# Patient Record
Sex: Female | Born: 1935 | Race: White | Hispanic: No | Smoking: Never smoker
Health system: Southern US, Community
[De-identification: ages and names within clinical notes are randomized; demographics above are authoritative.]

## PROBLEM LIST (undated history)

## (undated) DIAGNOSIS — Z87442 Personal history of urinary calculi: Secondary | ICD-10-CM

## (undated) DIAGNOSIS — E119 Type 2 diabetes mellitus without complications: Secondary | ICD-10-CM

## (undated) DIAGNOSIS — I4891 Unspecified atrial fibrillation: Secondary | ICD-10-CM

## (undated) DIAGNOSIS — I219 Acute myocardial infarction, unspecified: Secondary | ICD-10-CM

## (undated) DIAGNOSIS — Z794 Long term (current) use of insulin: Secondary | ICD-10-CM

## (undated) DIAGNOSIS — Z87898 Personal history of other specified conditions: Secondary | ICD-10-CM

## (undated) DIAGNOSIS — D473 Essential (hemorrhagic) thrombocythemia: Secondary | ICD-10-CM

## (undated) DIAGNOSIS — E7800 Pure hypercholesterolemia, unspecified: Secondary | ICD-10-CM

## (undated) DIAGNOSIS — I739 Peripheral vascular disease, unspecified: Secondary | ICD-10-CM

## (undated) DIAGNOSIS — F32A Depression, unspecified: Secondary | ICD-10-CM

## (undated) DIAGNOSIS — I10 Essential (primary) hypertension: Secondary | ICD-10-CM

## (undated) DIAGNOSIS — D7581 Myelofibrosis: Secondary | ICD-10-CM

## (undated) DIAGNOSIS — J449 Chronic obstructive pulmonary disease, unspecified: Secondary | ICD-10-CM

## (undated) DIAGNOSIS — F329 Major depressive disorder, single episode, unspecified: Secondary | ICD-10-CM

## (undated) DIAGNOSIS — IMO0001 Reserved for inherently not codable concepts without codable children: Secondary | ICD-10-CM

## (undated) DIAGNOSIS — I2510 Atherosclerotic heart disease of native coronary artery without angina pectoris: Secondary | ICD-10-CM

## (undated) HISTORY — DX: Essential (hemorrhagic) thrombocythemia: D47.3

## (undated) HISTORY — DX: Reserved for inherently not codable concepts without codable children: IMO0001

## (undated) HISTORY — DX: Peripheral vascular disease, unspecified: I73.9

## (undated) HISTORY — DX: Pure hypercholesterolemia, unspecified: E78.00

## (undated) HISTORY — PX: ABDOMINAL HYSTERECTOMY: SHX81

## (undated) HISTORY — DX: Type 2 diabetes mellitus without complications: E11.9

## (undated) HISTORY — DX: Unspecified atrial fibrillation: I48.91

## (undated) HISTORY — DX: Depression, unspecified: F32.A

## (undated) HISTORY — DX: Personal history of other specified conditions: Z87.898

## (undated) HISTORY — DX: Atherosclerotic heart disease of native coronary artery without angina pectoris: I25.10

## (undated) HISTORY — PX: CATARACT EXTRACTION: SUR2

## (undated) HISTORY — PX: CORONARY ARTERY BYPASS GRAFT: SHX141

## (undated) HISTORY — DX: Long term (current) use of insulin: Z79.4

## (undated) HISTORY — DX: Essential (primary) hypertension: I10

## (undated) HISTORY — DX: Major depressive disorder, single episode, unspecified: F32.9

---

## 2001-01-16 ENCOUNTER — Encounter: Payer: Self-pay | Admitting: Cardiology

## 2009-01-02 ENCOUNTER — Encounter: Payer: Self-pay | Admitting: Cardiology

## 2009-04-20 ENCOUNTER — Encounter: Payer: Self-pay | Admitting: Cardiology

## 2009-05-21 ENCOUNTER — Encounter: Payer: Self-pay | Admitting: Cardiology

## 2009-06-10 ENCOUNTER — Encounter: Payer: Self-pay | Admitting: Cardiology

## 2009-06-19 ENCOUNTER — Encounter: Payer: Self-pay | Admitting: Cardiology

## 2009-06-19 DIAGNOSIS — I2510 Atherosclerotic heart disease of native coronary artery without angina pectoris: Secondary | ICD-10-CM | POA: Insufficient documentation

## 2009-06-19 DIAGNOSIS — E785 Hyperlipidemia, unspecified: Secondary | ICD-10-CM

## 2009-06-19 DIAGNOSIS — E119 Type 2 diabetes mellitus without complications: Secondary | ICD-10-CM

## 2009-07-08 ENCOUNTER — Encounter: Payer: Self-pay | Admitting: Cardiology

## 2009-08-05 ENCOUNTER — Encounter: Payer: Self-pay | Admitting: Cardiology

## 2009-08-06 ENCOUNTER — Encounter: Payer: Self-pay | Admitting: Cardiology

## 2009-08-20 ENCOUNTER — Encounter (INDEPENDENT_AMBULATORY_CARE_PROVIDER_SITE_OTHER): Payer: Self-pay | Admitting: *Deleted

## 2009-08-20 ENCOUNTER — Ambulatory Visit: Payer: Self-pay | Admitting: Cardiology

## 2009-08-20 DIAGNOSIS — G4700 Insomnia, unspecified: Secondary | ICD-10-CM | POA: Insufficient documentation

## 2009-09-03 ENCOUNTER — Telehealth (INDEPENDENT_AMBULATORY_CARE_PROVIDER_SITE_OTHER): Payer: Self-pay | Admitting: *Deleted

## 2009-11-03 ENCOUNTER — Ambulatory Visit: Payer: Self-pay | Admitting: Cardiology

## 2010-07-06 NOTE — Assessment & Plan Note (Signed)
Summary: NP-FATIGUE,DYSPNEA ON EXERTION   Visit Type:  Initial Consult Primary Provider:  Dr. Ernestine Conrad  CC:  CAD and Fatigue.  History of Present Illness: The patient presents for evaluation of known coronary disease. She had bypass surgery she thinks n 2002 or 2004 n [place]. She doesn't report anyfollowup studies after this.She said at that time she had fatigueand back discomfort as her symptom.She doesn't recall the details of her surgery but said she had no problems with that.    She now lives hereand has not seen a cardiologistin many years. She has increasing fatigue with activity.This has been going on for about 18 months and slowly getting worse. She does report that she doesn't sleep well at night but she does take naps during the day. She finds it difficult to complete her daily activities without being tired. She does not describe shortness of breath. She does not have chest back neck or arm discomfort.She does not report palpitations, presyncope or syncope. She has no PND or orthopnea.  Preventive Screening-Counseling & Management  Alcohol-Tobacco     Smoking Status: never  Current Medications (verified): 1)  Ultram 50 Mg Tabs (Tramadol Hcl) .... Take One By Mouth Every 4-6 Hours As Needed Back Pain 2)  Lisinopril 20 Mg Tabs (Lisinopril) .... Take 1 Tablet By Mouth Once A Day 3)  Glucotrol Xl 10 Mg Xr24h-Tab (Glipizide) .... Take 1 Tablet By Mouth Once A Day 4)  Metoprolol Succinate 100 Mg Xr24h-Tab (Metoprolol Succinate) .... Take 1 Tablet By Mouth Once A Day 5)  Plavix 75 Mg Tabs (Clopidogrel Bisulfate) .... Take 1 Tablet By Mouth Once A Day 6)  Simvastatin 40 Mg Tabs (Simvastatin) .... Take 1 Tablet By Mouth Once A Day 7)  Novolin 70/30 70-30 % Susp (Insulin Isophane & Regular) .Marland Kitchen.. 10u Subcutaneously Two Times A Day 8)  Aspir-Low 81 Mg Tbec (Aspirin) .... Take 1 Tablet By Mouth Once A Day 9)  Hydroxyurea 500 Mg Caps (Hydroxyurea) .... Take 1 Tablet By Mouth Once A  Day 10)  Hydrocodone-Acetaminophen 5-500 Mg Tabs (Hydrocodone-Acetaminophen) .... As Needed Pain  Allergies: No Known Drug Allergies  Comments:  Nurse/Medical Assistant: The patient's medications were reviewed with the patient and were updated in the Medication List. Pt brought medication bottles to office visit.  Cyril Loosen, RN, BSN (August 20, 2009 11:38 AM)  Past History:  Past Medical History: CAD HTN x years TYPE II DM  (insulin x 1 year) Hyperlipidemia x 10 years  Family History: Father "Heart Burst" age 57 Mother sclerosis/EtOH  Social History: Tobacco Use - No.  Drug Use - no Alcohol, socially Single  Retired Psychiatric nurse No children  Review of Systems       Insomnia.  otherwise as stated in the history of present illness negative for all other systems.  Vital Signs:  Patient profile:   77 year old female Height:      69 inches Weight:      185 pounds BMI:     27.42 Pulse rate:   60 / minute BP sitting:   151 / 82  (right arm) Cuff size:   regular  Vitals Entered By: Cyril Loosen, RN, BSN (August 20, 2009 11:34 AM)  Nutrition Counseling: Patient's BMI is greater than 25 and therefore counseled on weight management options. CC: CAD, Fatigue   Physical Exam  General:  Well developed, well nourished, in no acute distress. Head:  normocephalic and atraumatic Eyes:  PERRLA/EOM intact; conjunctiva and lids normal. Mouth:  Poor dentition, gums and palate normal. Oral mucosa normal. Neck:  Neck supple, no JVD. No masses, thyromegaly or abnormal cervical nodes. Chest Wall:  ell healed surgical scar Lungs:  Clear bilaterally to auscultation and percussion. Abdomen:  Bowel sounds positive; abdomen soft and non-tender without masses, organomegaly, or hernias noted. No hepatosplenomegaly. Msk:  Back normal, normal gait. Muscle strength and tone normal. Extremities:  No clubbing or cyanosis. Neurologic:  Alert and oriented x 3. Skin:  Intact without  lesions or rashes. Cervical Nodes:  no significant adenopathy Axillary Nodes:  no significant adenopathy Inguinal Nodes:  no significant adenopathy Psych:  Normal affect.   Detailed Cardiovascular Exam  Neck    Carotids: Carotids full and equal bilaterally without bruits.      Neck Veins: Normal, no JVD.    Heart    Inspection: no deformities or lifts noted.      Palpation: normal PMI with no thrills palpable.      Auscultation: regular rate and rhythm, S1, S2 without murmurs, rubs, gallops, or clicks.    Vascular    Abdominal Aorta: no palpable masses, pulsations, or audible bruits.      Femoral Pulses: normal femoral pulses bilaterally.      Pedal Pulses: normal pedal pulses bilaterally.      Radial Pulses: normal radial pulses bilaterally.      Peripheral Circulation: no clubbing, cyanosis, or edema noted with normal capillary refill.     EKG  Procedure date:  08/20/2009  Findings:       Sinus bradycardia, rate 57, axis within normal limits, intervals within normal limits, no acute ST-T wave changes.  Impression & Recommendations:  Problem # 1:  CORONARY ATHEROSCLEROSIS NATIVE CORONARY ARTERY (ICD-414.01) The patient has known coronary disease.  She has had no followup studies since her bypass which may have been7 years ago. I will try to get these records from [place].I am concerned that her current fatigue could be an anginal equivalent. Further testing is indicated with exercise perfusion imaging. We will continue with risk reduction as well. Orders: EKG w/ Interpretation (93000) Nuclear Med (Nuc Med)  Problem # 2:  INSOMNIA (ICD-780.52) I have asked her to review this with her primary physician as this is clearly contributing to her fatigue and needs to be treated.  Problem # 3:  HYPERLIPIDEMIA (ICD-272.4) i will defer to her primary physician with a goal LDL less than 70 and HDL greater than 50. Her updated medication list for this problem includes:     Simvastatin 40 Mg Tabs (Simvastatin) .Marland Kitchen... Take 1 tablet by mouth once a day  Patient Instructions: 1)  Your physician recommends that you continue on your current medications as directed. Please refer to the Current Medication list given to you today. 2)  Your physician wants you to follow-up in: 18 months. You will receive a reminder letter in the mail about two months in advance. If you don't receive a letter, please call our office to schedule the follow-up appointment. 3)  Your physician has requested that you have an exercise stress myoview.  For further information please visit [email or web address]  Please follow instruction sheet, as given. 4)  We are sending a release of records request to the [facility] [place].

## 2010-07-06 NOTE — Letter (Signed)
Summary: External Correspondence/ [facility]  External Correspondence/ [facility]   Imported By: Dorise Hiss 08/20/2009 10:16:56  _____________________________________________________________________  External Attachment:    Type:   Image     Comment:   External Document

## 2010-07-06 NOTE — Letter (Signed)
Summary: Pharmacist, community at [facility]. [address]   [place], [place] [address]   Phone: [phone]  Fax: [phone]      [facility] Cardiovascular Services  Cardiolite Stress Test     [facility]  Your doctor has ordered a Cardiolite Stress Test to help determine the condition of your heart during stress. If you take blood pressure medicine, ask your doctor if you should take it the day of your test. HOLD YOUR METOPROLOL THE DAY OF YOUR TEST. You should not have anything to eat or drink at least 4 hours before your test is scheduled, and no caffeine (coffee, tea, decaf. or chocolate) for 24 hours before your test.   You will need to register at the Outpatient/Main Entrance at the hospital 30 minutes before your appointment time. It is a good idea to bring a copy of your order with you. They will direct you to the Diagnostic Imaging (Radiology) Department.  You will be asked to undress from the waist up and be given a hospital gown to wear, so dress comfortably from the waist down, for example:    Sweat pants, shorts or skirt   Rubber-soled lace up shoes (i.e. tennis shoes)  Plan on about three hours from registration to release from the hospital.    Date of Test:              Time of Test

## 2010-07-06 NOTE — Progress Notes (Signed)
Summary: Missed appt for stress test  Phone Note Outgoing Call Call back at [facility] Phone [phone]   Call placed by: Cyril Loosen, RN, BSN,  September 03, 2009 8:28 AM Call placed to: Patient Summary of Call: Pt was scheduled for Cardiolite stress test on Monday, March 28th. It does not appear pt went for this test. Spoke with pt this am who states she has so much going on that she just forgot. She does want [place] to r/s this test. Pt notified that Lynden Ang will r/s test and notify her of date and time. Pt verbalized understanding.  Initial call taken by: Cyril Loosen, RN, BSN,  September 03, 2009 8:29 AM

## 2010-07-06 NOTE — Letter (Signed)
Summary: Appointment -missed  [facility] at [facility]. [address]   [place], [place] [address]   Phone: [phone]  Fax: [phone]     June 19, 2009 MRN: [id number]      Theresa Bird [address] [place], [place]  [address]      Dear Ms. BARONI,  Our records indicate you missed your appointment on June 19, 2009                        with Dr.  Diona Browner.   It is very important that we reach you to reschedule this appointment. We look forward to participating in your health care needs.   Please contact us at the number listed above at your earliest convenience to reschedule this appointment.   Sincerely,    Glass blower/designer

## 2010-07-06 NOTE — Letter (Signed)
Summary: External [employer] MEDICAL  External [facility] MEDICAL   Imported By: Dorise Hiss 09/01/2009 09:43:26  _____________________________________________________________________  External Attachment:    Type:   Image     Comment:   External Document

## 2010-07-06 NOTE — Letter (Signed)
Summary: [facility]  [facility]   Imported By: Zachary George 06/19/2009 09:14:30  _____________________________________________________________________  External Attachment:    Type:   Image     Comment:   External Document

## 2011-06-08 DIAGNOSIS — I4891 Unspecified atrial fibrillation: Secondary | ICD-10-CM

## 2011-07-13 DIAGNOSIS — F329 Major depressive disorder, single episode, unspecified: Secondary | ICD-10-CM | POA: Insufficient documentation

## 2011-07-13 DIAGNOSIS — E119 Type 2 diabetes mellitus without complications: Secondary | ICD-10-CM | POA: Insufficient documentation

## 2011-07-26 ENCOUNTER — Telehealth: Payer: Self-pay | Admitting: Cardiology

## 2011-07-26 NOTE — Telephone Encounter (Signed)
01/05/11 & 05/04/11  mailed reminder to schedule f/u 

## 2011-10-21 LAB — PULMONARY FUNCTION TEST

## 2012-01-26 ENCOUNTER — Encounter: Payer: Medicare Other | Admitting: Internal Medicine

## 2012-01-26 DIAGNOSIS — D473 Essential (hemorrhagic) thrombocythemia: Secondary | ICD-10-CM

## 2012-01-26 DIAGNOSIS — R5383 Other fatigue: Secondary | ICD-10-CM

## 2012-01-26 DIAGNOSIS — R5381 Other malaise: Secondary | ICD-10-CM

## 2012-05-11 DIAGNOSIS — J449 Chronic obstructive pulmonary disease, unspecified: Secondary | ICD-10-CM | POA: Insufficient documentation

## 2012-05-11 DIAGNOSIS — D473 Essential (hemorrhagic) thrombocythemia: Secondary | ICD-10-CM | POA: Insufficient documentation

## 2012-07-16 DIAGNOSIS — T451X5A Adverse effect of antineoplastic and immunosuppressive drugs, initial encounter: Secondary | ICD-10-CM

## 2012-07-16 DIAGNOSIS — D473 Essential (hemorrhagic) thrombocythemia: Secondary | ICD-10-CM

## 2012-07-16 DIAGNOSIS — E871 Hypo-osmolality and hyponatremia: Secondary | ICD-10-CM

## 2012-07-16 DIAGNOSIS — D6481 Anemia due to antineoplastic chemotherapy: Secondary | ICD-10-CM

## 2012-08-01 DIAGNOSIS — D473 Essential (hemorrhagic) thrombocythemia: Secondary | ICD-10-CM

## 2012-08-01 DIAGNOSIS — D509 Iron deficiency anemia, unspecified: Secondary | ICD-10-CM

## 2012-08-08 DIAGNOSIS — D509 Iron deficiency anemia, unspecified: Secondary | ICD-10-CM

## 2012-08-13 DIAGNOSIS — J209 Acute bronchitis, unspecified: Secondary | ICD-10-CM

## 2012-08-13 DIAGNOSIS — D473 Essential (hemorrhagic) thrombocythemia: Secondary | ICD-10-CM

## 2012-08-13 DIAGNOSIS — D509 Iron deficiency anemia, unspecified: Secondary | ICD-10-CM

## 2012-09-27 ENCOUNTER — Encounter: Payer: Medicare Other | Admitting: Internal Medicine

## 2012-09-27 DIAGNOSIS — R05 Cough: Secondary | ICD-10-CM

## 2012-09-27 DIAGNOSIS — D509 Iron deficiency anemia, unspecified: Secondary | ICD-10-CM

## 2012-09-27 DIAGNOSIS — D473 Essential (hemorrhagic) thrombocythemia: Secondary | ICD-10-CM

## 2013-02-19 DIAGNOSIS — D473 Essential (hemorrhagic) thrombocythemia: Secondary | ICD-10-CM

## 2013-02-22 DIAGNOSIS — D473 Essential (hemorrhagic) thrombocythemia: Secondary | ICD-10-CM

## 2013-03-06 ENCOUNTER — Encounter: Payer: Self-pay | Admitting: Cardiology

## 2013-03-29 DIAGNOSIS — I499 Cardiac arrhythmia, unspecified: Secondary | ICD-10-CM

## 2013-03-29 DIAGNOSIS — E871 Hypo-osmolality and hyponatremia: Secondary | ICD-10-CM

## 2013-03-29 DIAGNOSIS — D473 Essential (hemorrhagic) thrombocythemia: Secondary | ICD-10-CM

## 2013-04-05 ENCOUNTER — Encounter: Payer: Self-pay | Admitting: *Deleted

## 2013-04-08 ENCOUNTER — Ambulatory Visit (INDEPENDENT_AMBULATORY_CARE_PROVIDER_SITE_OTHER): Payer: Medicare Other | Admitting: Cardiology

## 2013-04-08 ENCOUNTER — Encounter: Payer: Self-pay | Admitting: Cardiology

## 2013-04-08 VITALS — BP 80/48 | HR 62 | Ht 69.0 in | Wt 185.1 lb

## 2013-04-08 DIAGNOSIS — I959 Hypotension, unspecified: Secondary | ICD-10-CM

## 2013-04-08 DIAGNOSIS — I10 Essential (primary) hypertension: Secondary | ICD-10-CM | POA: Insufficient documentation

## 2013-04-08 DIAGNOSIS — Z23 Encounter for immunization: Secondary | ICD-10-CM

## 2013-04-08 DIAGNOSIS — I480 Paroxysmal atrial fibrillation: Secondary | ICD-10-CM | POA: Insufficient documentation

## 2013-04-08 DIAGNOSIS — I4891 Unspecified atrial fibrillation: Secondary | ICD-10-CM

## 2013-04-08 DIAGNOSIS — E785 Hyperlipidemia, unspecified: Secondary | ICD-10-CM

## 2013-04-08 DIAGNOSIS — I2510 Atherosclerotic heart disease of native coronary artery without angina pectoris: Secondary | ICD-10-CM

## 2013-04-08 DIAGNOSIS — D473 Essential (hemorrhagic) thrombocythemia: Secondary | ICD-10-CM

## 2013-04-08 DIAGNOSIS — Z79899 Other long term (current) drug therapy: Secondary | ICD-10-CM

## 2013-04-08 MED ORDER — AMIODARONE HCL 200 MG PO TABS: 200.0000 mg | ORAL_TABLET | Freq: Every day | ORAL | Status: DC

## 2013-04-08 MED ORDER — RIVAROXABAN 20 MG PO TABS: 20.0000 mg | ORAL_TABLET | Freq: Every day | ORAL | Status: DC

## 2013-04-08 MED ORDER — LISINOPRIL 10 MG PO TABS: 10.0000 mg | ORAL_TABLET | Freq: Every day | ORAL | Status: DC

## 2013-04-08 NOTE — Patient Instructions (Signed)
Your physician recommends that you schedule a follow-up appointment in: 3 months. Your physician has recommended you make the following change in your medication:  STOP ASPIRIN START XARELTO 20 MG DAILY. New prescription sent. DECREASE LISINOPRIL TO 10 MG DAILY. Break your 20 mg tablet in half daily until they are finished. New prescription sent. DECREASE AMIODARONE TO 200 MG DAILY. Break your 400 mg tablet in half daily until they are finished. New prescription sent. All other medications will remain the same. Your physician recommends that you lab work in 3 months just before your next visit in February for CMET,CBC and TSH. You don't have to fast for this lab work.

## 2013-04-08 NOTE — Assessment & Plan Note (Signed)
Followed by [facility]. Currently not on statin medication. Recommend followup for lipid assessment.

## 2013-04-08 NOTE — Assessment & Plan Note (Signed)
Reduce lisinopril to 10 mg daily for now. Keep follow up with Dr. Loney Hering.

## 2013-04-08 NOTE — Progress Notes (Signed)
Clinical Summary Ms. Stallone is a 79 y.o.female referred to the office by Dr. Tawnya Crook for cardiology evaluation. This is my first meeting with the patient. Record review finds prior evaluation in our practice by Dr. Antoine Poche in 2012, also followup with Dr. Titus Mould with the [facility] practice, possibly even Dr. Andee Lineman, although complete records are not yet available..   She is here with her significant other today. She explains that she was diagnosed with atrial fibrillation last year during a bout with flu. She was originally on Coumadin although this was discontinued due to a left leg bleed. She was then placed on Xarelto 20 mg daily, which she reports tolerating without significant bleeding problem. She has also been on amiodarone at 400 mg twice daily for at least the last 4-6 months, details are not clear. I presume that she was placed on a loading dose which was never decreased. Does not appear that she has had cardiology followup since 2013. She states that she ran out of Xarelto within the last 6 months and has not been able to have it refilled.  Question per Dr. Tawnya Crook relates to patient's cardiac history and potential use for anagrelide for treatment of thrombocytosis. Caution is recommended in patient's with concurrent cardiovascular disease, also potential side effects including CHF and arrhythmias. Patient states she has been on Hydrea otherwise without difficulty.  ECG today shows normal sinus rhythm. Recent lab work shows hemoglobin 11.8, platelets 968, BUN 9, creatinine 0.9, potassium 4.3.  Echocardiogram from January 2013 revealed LVEF greater than 65% with grade 2 diastolic dysfunction, moderately dilated left atrium, no significant valvular abnormalities.   No Known Allergies  Current Outpatient Prescriptions  Medication Sig Dispense Refill  . clarithromycin (BIAXIN) 500 MG tablet Take 500 mg by mouth 2 (two) times daily.      . folic acid (FOLVITE) 1 MG tablet Take 1 mg by mouth  daily.      . hydrOXYzine (ATARAX/VISTARIL) 50 MG tablet Take 50 mg by mouth every 8 (eight) hours.      . insulin aspart (NOVOLOG) 100 UNIT/ML injection Inject 10 Units into the skin 2 (two) times daily.      . metoprolol succinate (TOPROL-XL) 100 MG 24 hr tablet Take 100 mg by mouth daily. Take with or immediately following a meal.      . Multiple Vitamins-Minerals (CENTRUM PO) Take 1 tablet by mouth daily.      Marland Kitchen amiodarone (PACERONE) 200 MG tablet Take 1 tablet (200 mg total) by mouth daily.  90 tablet  3  . lisinopril (PRINIVIL,ZESTRIL) 10 MG tablet Take 1 tablet (10 mg total) by mouth daily.  90 tablet  3  . Rivaroxaban (XARELTO) 20 MG TABS tablet Take 1 tablet (20 mg total) by mouth daily.  30 tablet  3   No current facility-administered medications for this visit.    Past Medical History  Diagnosis Date  . Depression   . Essential hypertension, benign   . Hypercholesteremia   . Insulin dependent diabetes mellitus   . 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 Tawnya Crook  . Coronary atherosclerosis of native coronary artery     Multivessel status post CABG in [place]  . Atrial fibrillation     Past Surgical History  Procedure Laterality Date  . Abdominal hysterectomy    . Cataract extraction    . Coronary artery bypass graft      Possibly 2002 in [place]    Family History  Problem Relation Age of Onset  . Coronary artery disease      Social History Ms. Egle reports that she has never smoked. She does not have any smokeless tobacco history on file. Ms. Peterkin reports that she drinks alcohol.  Review of Systems Reports no chest pain symptoms, no palpitations of any significance. No cardiac hospitalizations. No spontaneous bleeding problems on aspirin. Stable appetite.  Physical Examination Filed Vitals:   04/08/13 1034  BP: 80/48  Pulse: 62   Filed Weights   04/08/13 1034  Weight: 185 lb 1.9 oz (83.97 kg)   Chronically ill-appearing  woman, no distress. HEENT: Conjunctiva and lids normal, oropharynx clear. Neck: Supple, no elevated JVP or carotid bruits, no thyromegaly. Lungs: Diminished breath sounds, clear, nonlabored breathing at rest. Cardiac: Regular rate and rhythm, no S3 or significant systolic murmur, no pericardial rub. Abdomen: Soft, nontender, bowel sounds present. Extremities: No pitting edema, distal pulses 1-2+. Skin: Warm and dry. Musculoskeletal: No kyphosis. Neuropsychiatric: Alert and oriented x3, affect grossly appropriate.   Problem List and Plan   Atrial fibrillation Paroxysmal based on history. Complete records being requested from [facility] in order to review her most recent cardiac evaluation, last apparently in 2013. We discussed her current medications and prior use of Xarelto. At this point plan to decrease amiodarone to 200 mg once daily, stop aspirin and initiate Xarelto 20 mg daily. She will followup in the next 3 months with CBC, BMET, and TSH.  CORONARY ATHEROSCLEROSIS NATIVE CORONARY ARTERY No active angina symptoms. Multivessel disease status post CABG in [place] approximately 10 years ago. LVEF greater than 65% by echocardiogram last year.  Hypotension Reduce lisinopril to 10 mg daily for now. Keep follow up with Dr. Loney Hering.  HYPERLIPIDEMIA Followed by [facility]. Currently not on statin medication. Recommend followup for lipid assessment.    Jonelle Sidle, M.D., F.A.C.C.

## 2013-04-08 NOTE — Assessment & Plan Note (Signed)
Paroxysmal based on history. Complete records being requested from [facility] in order to review her most recent cardiac evaluation, last apparently in 2013. We discussed her current medications and prior use of Xarelto. At this point plan to decrease amiodarone to 200 mg once daily, stop aspirin and initiate Xarelto 20 mg daily. She will followup in the next 3 months with CBC, BMET, and TSH.

## 2013-04-08 NOTE — Assessment & Plan Note (Signed)
No active angina symptoms. Multivessel disease status post CABG in [place] approximately 10 years ago. LVEF greater than 65% by echocardiogram last year.

## 2013-04-22 DIAGNOSIS — D473 Essential (hemorrhagic) thrombocythemia: Secondary | ICD-10-CM

## 2013-07-23 ENCOUNTER — Ambulatory Visit: Payer: Medicare Other | Admitting: Cardiology

## 2013-08-15 ENCOUNTER — Encounter: Payer: Self-pay | Admitting: Cardiology

## 2013-08-15 ENCOUNTER — Ambulatory Visit (INDEPENDENT_AMBULATORY_CARE_PROVIDER_SITE_OTHER): Payer: Medicare Other | Admitting: Cardiology

## 2013-08-15 VITALS — BP 111/69 | HR 61 | Ht 69.0 in | Wt 180.1 lb

## 2013-08-15 DIAGNOSIS — I4891 Unspecified atrial fibrillation: Secondary | ICD-10-CM

## 2013-08-15 DIAGNOSIS — I2510 Atherosclerotic heart disease of native coronary artery without angina pectoris: Secondary | ICD-10-CM

## 2013-08-15 NOTE — Assessment & Plan Note (Signed)
Paroxysmal, maintaining sinus rhythm. We discussed amiodarone, and for now she is most comfortable staying off of the medication. Obviously, depending on how frequently she manifests PAF, we may need to reconsider this. Otherwise will continue Toprol-XL and Xarelto.

## 2013-08-15 NOTE — Patient Instructions (Signed)
Your physician recommends that you schedule a follow-up appointment in: 4 months. You will receive a reminder letter in the mail in about 1-2 months reminding you to call and schedule your appointment. If you don't receive this letter, please contact our office. Your physician recommends that you continue on your current medications as directed. Please refer to the Current Medication list given to you today. 

## 2013-08-15 NOTE — Progress Notes (Signed)
Clinical Summary Theresa Bird is a 80 y.o.female seen for the first time back in November 2014. At that point she was initiated on Xarelto 20 mg daily and amiodarone was decreased to 200 mg daily for management of PAF. She tells me that she has tolerated her medications, has had no palpitations or shortness of breath. She actually stopped taking amiodarone altogether, states that she misunderstood our original instructions and thought that she was to wean off of the medication.  Recent lab work done earlier this morning showed hemoglobin 13.8, platelets 692, BUN 15, creatinine 0.8, potassium 3.9, TSH 0.39, AST 19, ALT 17.  She reports no bleeding problems. She follows with Dr. Jacquiline Doe for hematology care.   No Known Allergies  Current Outpatient Prescriptions  Medication Sig Dispense Refill  . folic acid (FOLVITE) 1 MG tablet Take 1 mg by mouth daily.      . hydrOXYzine (ATARAX/VISTARIL) 50 MG tablet Take 50 mg by mouth every 8 (eight) hours.      . insulin aspart (NOVOLOG) 100 UNIT/ML injection Inject 8 Units into the skin 2 (two) times daily.       Marland Kitchen lisinopril (PRINIVIL,ZESTRIL) 10 MG tablet Take 1 tablet (10 mg total) by mouth daily.  90 tablet  3  . metoprolol succinate (TOPROL-XL) 100 MG 24 hr tablet Take 100 mg by mouth daily. Take with or immediately following a meal.      . Multiple Vitamins-Minerals (CENTRUM PO) Take 1 tablet by mouth daily.      . Rivaroxaban (XARELTO) 20 MG TABS tablet Take 1 tablet (20 mg total) by mouth daily.  30 tablet  3  . sertraline (ZOLOFT) 25 MG tablet Take 25 mg by mouth daily.       No current facility-administered medications for this visit.    Past Medical History  Diagnosis Date  . Depression   . Essential hypertension, benign   . Hypercholesteremia   . Insulin dependent diabetes mellitus   . 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 Owens Loffler  . Coronary atherosclerosis of native coronary artery     Multivessel status  post CABG in [place]  . Atrial fibrillation     Past Surgical History  Procedure Laterality Date  . Abdominal hysterectomy    . Cataract extraction    . Coronary artery bypass graft      Possibly 2002 in [place] Ms. Wrightsman reports that she has never smoked. She does not have any smokeless tobacco history on file. Ms. Suchecki reports that she drinks alcohol.  Review of Systems Negative except as outlined.  Physical Examination Filed Vitals:   08/15/13 1511  BP: 111/69  Pulse: 61   Filed Weights   08/15/13 1511  Weight: 180 lb 1.9 oz (81.702 kg)    Appears comfortable at rest.  HEENT: Conjunctiva and lids normal, oropharynx clear.  Neck: Supple, no elevated JVP or carotid bruits, no thyromegaly.  Lungs: Diminished breath sounds, clear, nonlabored breathing at rest.  Cardiac: Regular rate and rhythm, no S3 or significant systolic murmur, no pericardial rub.  Abdomen: Soft, nontender, bowel sounds present.  Extremities: No pitting edema, distal pulses 1-2+.  Skin: Warm and dry.  Musculoskeletal: No kyphosis.  Neuropsychiatric: Alert and oriented x3, affect grossly appropriate.   Problem List and Plan   Atrial fibrillation Paroxysmal, maintaining sinus rhythm. We discussed amiodarone, and for now she is most comfortable staying off of the medication. Obviously, depending on  how frequently she manifests PAF, we may need to reconsider this. Otherwise will continue Toprol-XL and Xarelto.  CORONARY ATHEROSCLEROSIS NATIVE CORONARY ARTERY No active angina symptoms.    Satira Sark, M.D., F.A.C.C.

## 2013-08-15 NOTE — Assessment & Plan Note (Signed)
No active angina symptoms.

## 2013-08-21 ENCOUNTER — Other Ambulatory Visit: Payer: Self-pay | Admitting: *Deleted

## 2013-08-21 MED ORDER — RIVAROXABAN 20 MG PO TABS: 20.0000 mg | ORAL_TABLET | Freq: Every day | ORAL | Status: DC

## 2014-03-21 ENCOUNTER — Encounter: Payer: Self-pay | Admitting: Cardiology

## 2014-03-21 ENCOUNTER — Ambulatory Visit (INDEPENDENT_AMBULATORY_CARE_PROVIDER_SITE_OTHER): Payer: Medicare Other | Admitting: Cardiology

## 2014-03-21 VITALS — BP 105/67 | HR 65 | Ht 69.0 in | Wt 162.2 lb

## 2014-03-21 DIAGNOSIS — I10 Essential (primary) hypertension: Secondary | ICD-10-CM

## 2014-03-21 DIAGNOSIS — I4891 Unspecified atrial fibrillation: Secondary | ICD-10-CM

## 2014-03-21 DIAGNOSIS — I2510 Atherosclerotic heart disease of native coronary artery without angina pectoris: Secondary | ICD-10-CM

## 2014-03-21 MED ORDER — AMIODARONE HCL 100 MG PO TABS: 100.0000 mg | ORAL_TABLET | Freq: Every day | ORAL | Status: DC

## 2014-03-21 NOTE — Assessment & Plan Note (Signed)
Blood pressure is normal today. No change in current regimen.

## 2014-03-21 NOTE — Patient Instructions (Signed)
   Decrease Amiodarone to 100mg  DAILY  Continue all other medications.   Follow up in  3 months

## 2014-03-21 NOTE — Progress Notes (Signed)
Clinical Summary Ms. Soderberg is a 80 y.o.female that walked into the office this afternoon stating that she was told after recent discharge from [facility] that she had an appointment today. This was in fact not the case, however we worked her in.   I was able to review her records. She was admitted to [facility] in late September with shortness of breath and cough. At that time she was noted to be back in atrial fibrillation with concurrent evidence of bronchitis and UTI. She was seen by Dr. Hamilton Capri with the [facility] cardiology practice. She had evidence of persistent atrial fibrillation despite treatment for her other comorbidities and ultimately underwent elective cardioversion with successful restoration of sinus rhythm.  Echocardiogram from September 29 reported mild LVH with LVEF 60-65%, mild left atrial enlargement, moderately sclerotic aortic valve, mild mitral regurgitation, RVSP 36 mm mercury. Lab work in early October showed BUN 21, creatinine 0.7, potassium 3.8, hemoglobin 12.0, platelets 551. Peak troponin I was only 0.04 during rapid ventricular response.  At last visit back in March, she had actually taken herself off of amiodarone, now back on the medication for 100 mg twice daily since late September.  He states she feels better. Wears oxygen chronically. She has seen Dr. Wenda Overland.  ECG today shows normal sinus rhythm.  No Known Allergies  Current Outpatient Prescriptions  Medication Sig Dispense Refill  . amiodarone (PACERONE) 100 MG tablet Take 1 tablet (100 mg total) by mouth daily.      Marland Kitchen aspirin 325 MG tablet Take 325 mg by mouth daily.      . folic acid (FOLVITE) 1 MG tablet Take 1 mg by mouth daily.      . hydroxyurea (HYDREA) 500 MG capsule Take 500 mg by mouth daily. May take with food to minimize GI side effects.      . hydrOXYzine (ATARAX/VISTARIL) 50 MG tablet Take 50 mg by mouth every 8 (eight) hours.      . insulin aspart (NOVOLOG) 100 UNIT/ML injection Inject 15  Units into the skin 2 (two) times daily.       Marland Kitchen lisinopril (PRINIVIL,ZESTRIL) 20 MG tablet Take 20 mg by mouth daily.      . metoprolol (LOPRESSOR) 50 MG tablet Take 50 mg by mouth daily.      . Multiple Vitamins-Minerals (CENTRUM PO) Take 1 tablet by mouth daily.      . OXYGEN Inhale 2 L into the lungs.      . Rivaroxaban (XARELTO) 20 MG TABS tablet Take 1 tablet (20 mg total) by mouth daily.  30 tablet  6  . sertraline (ZOLOFT) 50 MG tablet Take 50 mg by mouth daily.      . traMADol (ULTRAM) 50 MG tablet Take 50 mg by mouth as needed.       No current facility-administered medications for this visit.    Past Medical History  Diagnosis Date  . Depression   . Essential hypertension, benign   . Hypercholesteremia   . Insulin dependent diabetes mellitus   . 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 Owens Loffler  . Coronary atherosclerosis of native coronary artery     Multivessel status post CABG in [place]  . Atrial fibrillation     Past Surgical History  Procedure Laterality Date  . Abdominal hysterectomy    . Cataract extraction    . Coronary artery bypass graft      Possibly 2002 in [place] Ms.  Schiano reports that she has never smoked. She has never used smokeless tobacco. Ms. Hohmann reports that she drinks alcohol.  Review of Systems No palpitations or chest pain. No bleeding problems on Xarelto. Chronically short of breath, cough improving. No fevers or chills. Other systems reviewed and negative.  Physical Examination Filed Vitals:   03/21/14 1546  BP: 105/67  Pulse: 65   Filed Weights   03/21/14 1546  Weight: 162 lb 4 oz (73.596 kg)   Appears comfortable at rest. Wearing oxygen via nasal cannula. HEENT: Conjunctiva and lids normal, oropharynx clear.  Neck: Supple, no elevated JVP or carotid bruits, no thyromegaly.  Lungs: Diminished breath sounds, clear, nonlabored breathing at rest.  Cardiac: Regular rate and rhythm,  no S3 or significant systolic murmur, no pericardial rub.  Abdomen: Soft, nontender, bowel sounds present.  Extremities: No pitting edema, distal pulses 1-2+.  Skin: Warm and dry.  Musculoskeletal: No kyphosis.  Neuropsychiatric: Alert and oriented x3, affect grossly appropriate.   Problem List and Plan   Paroxysmal atrial fibrillation Currently in sinus rhythm following recent cardioversion in late September at [place]. Arrhythmia was noted in the setting of bronchitis and UTI. She was also not on amiodarone at that time. Would recommend reducing amiodarone to 100 mg daily for now, continue Xarelto. Plan to see her back in 3 months.  CORONARY ATHEROSCLEROSIS NATIVE CORONARY ARTERY No active angina. Multivessel disease status post previous CABG.  Essential hypertension Blood pressure is normal today. No change in current regimen.    Satira Sark, M.D., F.A.C.C.

## 2014-03-21 NOTE — Assessment & Plan Note (Signed)
Currently in sinus rhythm following recent cardioversion in late September at [place]. Arrhythmia was noted in the setting of bronchitis and UTI. She was also not on amiodarone at that time. Would recommend reducing amiodarone to 100 mg daily for now, continue Xarelto. Plan to see her back in 3 months.

## 2014-03-21 NOTE — Assessment & Plan Note (Signed)
No active angina. Multivessel disease status post previous CABG.

## 2014-04-08 ENCOUNTER — Encounter: Payer: Medicare Other | Admitting: Cardiology

## 2014-04-14 ENCOUNTER — Telehealth: Payer: Self-pay | Admitting: Cardiology

## 2014-04-14 MED ORDER — LISINOPRIL 20 MG PO TABS: 20.0000 mg | ORAL_TABLET | Freq: Every day | ORAL | Status: DC

## 2014-04-14 MED ORDER — RIVAROXABAN 20 MG PO TABS: 20.0000 mg | ORAL_TABLET | Freq: Every day | ORAL | Status: DC

## 2014-04-14 MED ORDER — METOPROLOL TARTRATE 50 MG PO TABS: 50.0000 mg | ORAL_TABLET | Freq: Every day | ORAL | Status: DC

## 2014-04-14 NOTE — Telephone Encounter (Signed)
Lisinopril 20MG   rivaroxaban 20MG  Metoprolol 50mg   [facility] told them that they dont have these medications on file for patient and he stated that they have been getting them there since very beginning.

## 2014-04-14 NOTE — Telephone Encounter (Signed)
Patient notified.  Prescriptions below e-scribed to [facility].

## 2014-05-26 ENCOUNTER — Other Ambulatory Visit: Payer: Self-pay | Admitting: *Deleted

## 2014-05-26 MED ORDER — AMIODARONE HCL 100 MG PO TABS: 100.0000 mg | ORAL_TABLET | Freq: Every day | ORAL | Status: DC

## 2014-09-07 ENCOUNTER — Inpatient Hospital Stay (HOSPITAL_COMMUNITY)
Admission: AD | Admit: 2014-09-07 | Discharge: 2014-09-11 | DRG: 309 | Disposition: A | Discharge status: Home / Self Care | Payer: Medicare Other | Source: Other Acute Inpatient Hospital | Attending: Cardiology | Admitting: Cardiology

## 2014-09-07 DIAGNOSIS — E780 Pure hypercholesterolemia: Secondary | ICD-10-CM | POA: Diagnosis not present

## 2014-09-07 DIAGNOSIS — E119 Type 2 diabetes mellitus without complications: Secondary | ICD-10-CM | POA: Diagnosis not present

## 2014-09-07 DIAGNOSIS — I10 Essential (primary) hypertension: Secondary | ICD-10-CM | POA: Diagnosis not present

## 2014-09-07 DIAGNOSIS — F329 Major depressive disorder, single episode, unspecified: Secondary | ICD-10-CM | POA: Diagnosis present

## 2014-09-07 DIAGNOSIS — Z7901 Long term (current) use of anticoagulants: Secondary | ICD-10-CM | POA: Diagnosis not present

## 2014-09-07 DIAGNOSIS — Z7982 Long term (current) use of aspirin: Secondary | ICD-10-CM

## 2014-09-07 DIAGNOSIS — Z794 Long term (current) use of insulin: Secondary | ICD-10-CM | POA: Diagnosis not present

## 2014-09-07 DIAGNOSIS — I480 Paroxysmal atrial fibrillation: Principal | ICD-10-CM | POA: Diagnosis present

## 2014-09-07 DIAGNOSIS — I2510 Atherosclerotic heart disease of native coronary artery without angina pectoris: Secondary | ICD-10-CM | POA: Diagnosis present

## 2014-09-07 DIAGNOSIS — N390 Urinary tract infection, site not specified: Secondary | ICD-10-CM | POA: Diagnosis present

## 2014-09-07 DIAGNOSIS — E785 Hyperlipidemia, unspecified: Secondary | ICD-10-CM | POA: Diagnosis not present

## 2014-09-07 DIAGNOSIS — D473 Essential (hemorrhagic) thrombocythemia: Secondary | ICD-10-CM | POA: Insufficient documentation

## 2014-09-07 DIAGNOSIS — I809 Phlebitis and thrombophlebitis of unspecified site: Secondary | ICD-10-CM | POA: Insufficient documentation

## 2014-09-07 DIAGNOSIS — R21 Rash and other nonspecific skin eruption: Secondary | ICD-10-CM | POA: Diagnosis present

## 2014-09-07 DIAGNOSIS — I4891 Unspecified atrial fibrillation: Secondary | ICD-10-CM

## 2014-09-07 DIAGNOSIS — Z113 Encounter for screening for infections with a predominantly sexual mode of transmission: Secondary | ICD-10-CM | POA: Insufficient documentation

## 2014-09-07 DIAGNOSIS — L03114 Cellulitis of left upper limb: Secondary | ICD-10-CM | POA: Diagnosis not present

## 2014-09-07 DIAGNOSIS — D7581 Myelofibrosis: Secondary | ICD-10-CM | POA: Diagnosis not present

## 2014-09-07 DIAGNOSIS — Z951 Presence of aortocoronary bypass graft: Secondary | ICD-10-CM | POA: Diagnosis not present

## 2014-09-07 HISTORY — DX: Myelofibrosis: D75.81

## 2014-09-07 LAB — CBC WITH DIFFERENTIAL/PLATELET
BASOS PCT: 1 % (ref 0–1)
Basophils Absolute: 0.2 10*3/uL — ABNORMAL HIGH (ref 0.0–0.1)
Eosinophils Absolute: 0.4 10*3/uL (ref 0.0–0.7)
Eosinophils Relative: 2 % (ref 0–5)
HCT: 39.4 % (ref 36.0–46.0)
HEMOGLOBIN: 12.6 g/dL (ref 12.0–15.0)
LYMPHS PCT: 6 % — AB (ref 12–46)
Lymphs Abs: 1.3 10*3/uL (ref 0.7–4.0)
MCH: 34.6 pg — ABNORMAL HIGH (ref 26.0–34.0)
MCHC: 32 g/dL (ref 30.0–36.0)
MCV: 108.2 fL — ABNORMAL HIGH (ref 78.0–100.0)
Monocytes Absolute: 1.5 10*3/uL — ABNORMAL HIGH (ref 0.1–1.0)
Monocytes Relative: 7 % (ref 3–12)
NEUTROS ABS: 19.7 10*3/uL — AB (ref 1.7–7.7)
NEUTROS PCT: 84 % — AB (ref 43–77)
PLATELETS: 659 10*3/uL — AB (ref 150–400)
RBC: 3.64 MIL/uL — ABNORMAL LOW (ref 3.87–5.11)
RDW: 17.5 % — ABNORMAL HIGH (ref 11.5–15.5)
WBC: 23.1 10*3/uL — ABNORMAL HIGH (ref 4.0–10.5)

## 2014-09-07 LAB — COMPREHENSIVE METABOLIC PANEL
ALT: 16 U/L (ref 0–35)
AST: 19 U/L (ref 0–37)
Albumin: 3.4 g/dL — ABNORMAL LOW (ref 3.5–5.2)
Alkaline Phosphatase: 69 U/L (ref 39–117)
Anion gap: 7 (ref 5–15)
BUN: 10 mg/dL (ref 6–23)
CO2: 32 mmol/L (ref 19–32)
CREATININE: 0.67 mg/dL (ref 0.50–1.10)
Calcium: 8.3 mg/dL — ABNORMAL LOW (ref 8.4–10.5)
Chloride: 95 mmol/L — ABNORMAL LOW (ref 96–112)
GFR calc non Af Amer: 81 mL/min — ABNORMAL LOW (ref >90)
Glucose, Bld: 158 mg/dL — ABNORMAL HIGH (ref 70–99)
POTASSIUM: 4.2 mmol/L (ref 3.5–5.1)
Sodium: 134 mmol/L — ABNORMAL LOW (ref 135–145)
TOTAL PROTEIN: 6.5 g/dL (ref 6.0–8.3)
Total Bilirubin: 1 mg/dL (ref 0.3–1.2)

## 2014-09-07 LAB — TSH: TSH: 1.334 u[IU]/mL (ref 0.350–4.500)

## 2014-09-07 LAB — GLUCOSE, CAPILLARY: GLUCOSE-CAPILLARY: 169 mg/dL — AB (ref 70–99)

## 2014-09-07 LAB — TROPONIN I: Troponin I: 0.05 ng/mL — ABNORMAL HIGH (ref <0.031)

## 2014-09-07 LAB — BRAIN NATRIURETIC PEPTIDE: B NATRIURETIC PEPTIDE 5: 452.5 pg/mL — AB (ref 0.0–100.0)

## 2014-09-07 LAB — PROTIME-INR
INR: 3.45 — ABNORMAL HIGH (ref 0.00–1.49)
PROTHROMBIN TIME: 35 s — AB (ref 11.6–15.2)

## 2014-09-07 MED ORDER — HYDROXYUREA 500 MG PO CAPS: 500.0000 mg | ORAL_CAPSULE | Freq: Every day | ORAL | Status: DC

## 2014-09-07 MED ORDER — ASPIRIN EC 81 MG PO TBEC
81.0000 mg | DELAYED_RELEASE_TABLET | Freq: Every day | ORAL | Status: DC
  Administered 2014-09-08 – 2014-09-11 (×4): 81 mg via ORAL
  Filled 2014-09-07 (×5): qty 1

## 2014-09-07 MED ORDER — HYDROXYZINE HCL 25 MG PO TABS
50.0000 mg | ORAL_TABLET | Freq: Four times a day (QID) | ORAL | Status: DC | PRN
  Administered 2014-09-10: 50 mg via ORAL
  Filled 2014-09-07: qty 2

## 2014-09-07 MED ORDER — HYDROXYUREA 500 MG PO CAPS
500.0000 mg | ORAL_CAPSULE | Freq: Every day | ORAL | Status: DC
  Administered 2014-09-08 – 2014-09-11 (×3): 500 mg via ORAL
  Filled 2014-09-07 (×5): qty 1

## 2014-09-07 MED ORDER — INSULIN ASPART 100 UNIT/ML ~~LOC~~ SOLN: 15.0000 [IU] | Freq: Two times a day (BID) | SUBCUTANEOUS | Status: DC

## 2014-09-07 MED ORDER — AMIODARONE HCL IN DEXTROSE 360-4.14 MG/200ML-% IV SOLN
30.0000 mg/h | INTRAVENOUS | Status: DC
  Administered 2014-09-08 – 2014-09-09 (×2): 30 mg/h via INTRAVENOUS
  Filled 2014-09-07 (×2): qty 200

## 2014-09-07 MED ORDER — TRAMADOL HCL 50 MG PO TABS: 50.0000 mg | ORAL_TABLET | Freq: Four times a day (QID) | ORAL | Status: DC | PRN

## 2014-09-07 MED ORDER — ACETAMINOPHEN 325 MG PO TABS: 650.0000 mg | ORAL_TABLET | ORAL | Status: DC | PRN

## 2014-09-07 MED ORDER — RIVAROXABAN 20 MG PO TABS
20.0000 mg | ORAL_TABLET | Freq: Every day | ORAL | Status: DC
  Administered 2014-09-08 – 2014-09-11 (×4): 20 mg via ORAL
  Filled 2014-09-07 (×6): qty 1

## 2014-09-07 MED ORDER — AMIODARONE HCL IN DEXTROSE 360-4.14 MG/200ML-% IV SOLN
60.0000 mg/h | INTRAVENOUS | Status: AC
  Administered 2014-09-07: 60 mg/h via INTRAVENOUS
  Filled 2014-09-07: qty 200

## 2014-09-07 MED ORDER — FOLIC ACID 1 MG PO TABS
1.0000 mg | ORAL_TABLET | Freq: Every day | ORAL | Status: DC
  Administered 2014-09-07 – 2014-09-11 (×5): 1 mg via ORAL
  Filled 2014-09-07 (×6): qty 1

## 2014-09-07 MED ORDER — AMIODARONE HCL IN DEXTROSE 360-4.14 MG/200ML-% IV SOLN
INTRAVENOUS | Status: AC
  Filled 2014-09-07: qty 200

## 2014-09-07 MED ORDER — ONDANSETRON HCL 4 MG/2ML IJ SOLN: 4.0000 mg | Freq: Four times a day (QID) | INTRAMUSCULAR | Status: DC | PRN

## 2014-09-07 MED ORDER — METOPROLOL TARTRATE 12.5 MG HALF TABLET
12.5000 mg | ORAL_TABLET | Freq: Four times a day (QID) | ORAL | Status: DC
  Administered 2014-09-07 – 2014-09-08 (×3): 12.5 mg via ORAL
  Filled 2014-09-07 (×3): qty 1

## 2014-09-07 MED ORDER — AMIODARONE LOAD VIA INFUSION
150.0000 mg | Freq: Once | INTRAVENOUS | Status: AC
  Administered 2014-09-07: 150 mg via INTRAVENOUS
  Filled 2014-09-07: qty 83.34

## 2014-09-07 MED ORDER — SERTRALINE HCL 50 MG PO TABS
50.0000 mg | ORAL_TABLET | Freq: Every day | ORAL | Status: DC
  Administered 2014-09-08 – 2014-09-11 (×4): 50 mg via ORAL
  Filled 2014-09-07 (×5): qty 1

## 2014-09-07 NOTE — H&P (Signed)
Theresa Bird is an 81 y.o. female.    Chief Complaint: leg rash and atrial fibrillation with RVR Primary Cardiologist: Dr. Domenic Polite HPI: Theresa Bird is a 81 yo woman with PMH of CAD s/p CABG '02, hypertension, dyslipidemia, paroxysmal atrial fibrillation on xarelto and amiodarone who presents with feeling weak and left leg rash. She was found to be in atrial fibrillation with RVR. She tells me she's felt off/weak for 3-4 days. She also has noted swelling in her left leg and some redness. She denies infectious symptoms or travel. She is compliant with her amiodarone and xarelto. No fever/chills. No nausea/vomiting/diarrhea. At [facility] she was trialled in IV diltiazem up to 15 mg/hr, IV metoprolol 5 mg but her HR stayed between 135-150 leading to transfer to [facility].   She was also treated for her rash with steroids and antihistamines.   Past Medical History  Diagnosis Date  . Depression   . Essential hypertension, benign   . Hypercholesteremia   . Insulin dependent diabetes mellitus   . 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 Owens Loffler  . Coronary atherosclerosis of native coronary artery     Multivessel status post CABG in [place]  . Atrial fibrillation     Past Surgical History  Procedure Laterality Date  . Abdominal hysterectomy    . Cataract extraction    . Coronary artery bypass graft      Possibly 2002 in [place]    Family History  Problem Relation Age of Onset  . Coronary artery disease     Social History:  reports that she has never smoked. She has never used smokeless tobacco. She reports that she drinks alcohol. She reports that she does not use illicit drugs.  Allergies: No Known Allergies  Medications Prior to Admission  Medication Sig Dispense Refill  . amiodarone (PACERONE) 100 MG tablet Take 1 tablet (100 mg total) by mouth daily. 90 tablet 3  . aspirin 325 MG tablet Take 325 mg by mouth daily.    . folic acid (FOLVITE) 1 MG  tablet Take 1 mg by mouth daily.    . hydroxyurea (HYDREA) 500 MG capsule Take 500 mg by mouth daily. May take with food to minimize GI side effects.    . hydrOXYzine (ATARAX/VISTARIL) 50 MG tablet Take 50 mg by mouth every 8 (eight) hours.    . insulin aspart (NOVOLOG) 100 UNIT/ML injection Inject 15 Units into the skin 2 (two) times daily.     Marland Kitchen lisinopril (PRINIVIL,ZESTRIL) 20 MG tablet Take 1 tablet (20 mg total) by mouth daily. 30 tablet 6  . metoprolol (LOPRESSOR) 50 MG tablet Take 1 tablet (50 mg total) by mouth daily. 30 tablet 6  . Multiple Vitamins-Minerals (CENTRUM PO) Take 1 tablet by mouth daily.    . OXYGEN Inhale 2 L into the lungs.    . rivaroxaban (XARELTO) 20 MG TABS tablet Take 1 tablet (20 mg total) by mouth daily. 30 tablet 6  . sertraline (ZOLOFT) 50 MG tablet Take 50 mg by mouth daily.    . traMADol (ULTRAM) 50 MG tablet Take 50 mg by mouth as needed.      No results found for this or any previous visit (from the past 48 hour(s)). No results found.  Review of Systems  Constitutional: Positive for malaise/fatigue. Negative for fever and chills.  HENT: Positive for hearing loss. Negative for ear discharge.   Eyes: Negative for double vision and photophobia.  Respiratory: Negative  for cough, hemoptysis and sputum production.   Cardiovascular: Positive for palpitations and leg swelling. Negative for chest pain.  Gastrointestinal: Negative for nausea, vomiting and abdominal pain.  Genitourinary: Negative for dysuria, frequency and hematuria.  Musculoskeletal: Negative for myalgias and neck pain.  Skin: Positive for rash.  Neurological: Positive for weakness. Negative for dizziness, tingling and headaches.  Endo/Heme/Allergies: Negative for polydipsia. Bruises/bleeds easily.  Psychiatric/Behavioral: Negative for depression, suicidal ideas and substance abuse. The patient is nervous/anxious.     Blood pressure 109/72, pulse 141, temperature 97.7 F (36.5 C),  temperature source Oral, resp. rate 18, SpO2 94 %. Physical Exam  Nursing note and vitals reviewed. Constitutional: She is oriented to person, place, and time. She appears well-developed and well-nourished. She appears distressed.  Mildly anxious and uncomfortable  HENT:  Head: Normocephalic and atraumatic.  Nose: Nose normal.  Mouth/Throat: Oropharynx is clear and moist. No oropharyngeal exudate.  Eyes: Conjunctivae and EOM are normal. Pupils are equal, round, and reactive to light. No scleral icterus.  Neck: Normal range of motion. Neck supple. No JVD present. No tracheal deviation present.  Cardiovascular: Normal heart sounds and intact distal pulses.  Exam reveals no gallop.   No murmur heard. Irregularly irregular, tachycardic  Respiratory: Effort normal and breath sounds normal. No respiratory distress. She has no wheezes. She has no rales.  GI: Soft. Bowel sounds are normal. She exhibits no distension. There is no tenderness.  Musculoskeletal: Normal range of motion. She exhibits edema.  Trace edema left thigh with redness/warmth  Neurological: She is alert and oriented to person, place, and time. No cranial nerve deficit. Coordination normal.  Skin: Skin is warm and dry. Rash noted. She is not diaphoretic. No erythema.  Psychiatric: She has a normal mood and affect. Her behavior is normal. Thought content normal.    Labs reviewed from chart from [facility]: INR 4.2/PTT 53.5 on xarelto; wbc 15.6 to 19.8 after steroids, na 134, K 4.1, bicarb 32, cr 0.9lactate 1.2, tsh 1.08, Trop 0.01 Chest x-ray with mild congestion ECG with atrial fibrillation with RVR  Assessment/Plan Theresa Bird is a 81 yo woman with PMH of CAD s/p CABG '02, hypertension, dyslipidemia, paroxysmal atrial fibrillation on xarelto and amiodarone who presents with feeling weak and left leg rash and found to have atrial fibrillation with RVR.  Problem List/Assessment Atrial fibrillation with RVR  Leg  rash Leukocytosis  Prior/Known CAD Dyslipidemia Hypertension  Plan:  - IV amiodarone 150 mg x1, amiodarone gtt afterwards - NPO after MN for likely DCCV - continue xarelto, no doses missed - no hypertensive medications - urinalysis, ecg, chest x-ray to evaluate other triggers - [place] left leg given rash/swelling   Theresa Bird 09/07/2014, 7:12 PM

## 2014-09-07 NOTE — Progress Notes (Signed)
Patient has arrived from [facility]. HR=140. Patient moderately anxious. Cardiac Fellow notified and admission orders requested. Will continue to monitor.

## 2014-09-08 ENCOUNTER — Encounter (HOSPITAL_COMMUNITY): Payer: Self-pay | Admitting: *Deleted

## 2014-09-08 DIAGNOSIS — I2510 Atherosclerotic heart disease of native coronary artery without angina pectoris: Secondary | ICD-10-CM

## 2014-09-08 DIAGNOSIS — D7581 Myelofibrosis: Secondary | ICD-10-CM | POA: Diagnosis present

## 2014-09-08 DIAGNOSIS — I4891 Unspecified atrial fibrillation: Secondary | ICD-10-CM | POA: Diagnosis not present

## 2014-09-08 DIAGNOSIS — Z7901 Long term (current) use of anticoagulants: Secondary | ICD-10-CM | POA: Diagnosis not present

## 2014-09-08 DIAGNOSIS — I10 Essential (primary) hypertension: Secondary | ICD-10-CM

## 2014-09-08 DIAGNOSIS — M7989 Other specified soft tissue disorders: Secondary | ICD-10-CM | POA: Diagnosis not present

## 2014-09-08 DIAGNOSIS — R8290 Unspecified abnormal findings in urine: Secondary | ICD-10-CM | POA: Diagnosis not present

## 2014-09-08 DIAGNOSIS — L03114 Cellulitis of left upper limb: Secondary | ICD-10-CM | POA: Diagnosis not present

## 2014-09-08 DIAGNOSIS — R21 Rash and other nonspecific skin eruption: Secondary | ICD-10-CM | POA: Diagnosis present

## 2014-09-08 DIAGNOSIS — I480 Paroxysmal atrial fibrillation: Secondary | ICD-10-CM | POA: Diagnosis present

## 2014-09-08 DIAGNOSIS — N390 Urinary tract infection, site not specified: Secondary | ICD-10-CM | POA: Diagnosis present

## 2014-09-08 DIAGNOSIS — E785 Hyperlipidemia, unspecified: Secondary | ICD-10-CM | POA: Diagnosis present

## 2014-09-08 DIAGNOSIS — I808 Phlebitis and thrombophlebitis of other sites: Secondary | ICD-10-CM | POA: Diagnosis not present

## 2014-09-08 DIAGNOSIS — Z951 Presence of aortocoronary bypass graft: Secondary | ICD-10-CM | POA: Diagnosis not present

## 2014-09-08 DIAGNOSIS — Z86718 Personal history of other venous thrombosis and embolism: Secondary | ICD-10-CM | POA: Diagnosis not present

## 2014-09-08 DIAGNOSIS — I809 Phlebitis and thrombophlebitis of unspecified site: Secondary | ICD-10-CM | POA: Diagnosis not present

## 2014-09-08 DIAGNOSIS — E780 Pure hypercholesterolemia: Secondary | ICD-10-CM | POA: Diagnosis present

## 2014-09-08 DIAGNOSIS — F329 Major depressive disorder, single episode, unspecified: Secondary | ICD-10-CM | POA: Diagnosis present

## 2014-09-08 DIAGNOSIS — E119 Type 2 diabetes mellitus without complications: Secondary | ICD-10-CM | POA: Diagnosis present

## 2014-09-08 DIAGNOSIS — D72829 Elevated white blood cell count, unspecified: Secondary | ICD-10-CM | POA: Diagnosis not present

## 2014-09-08 DIAGNOSIS — D473 Essential (hemorrhagic) thrombocythemia: Secondary | ICD-10-CM | POA: Diagnosis present

## 2014-09-08 DIAGNOSIS — Z7982 Long term (current) use of aspirin: Secondary | ICD-10-CM | POA: Diagnosis not present

## 2014-09-08 DIAGNOSIS — Z794 Long term (current) use of insulin: Secondary | ICD-10-CM | POA: Diagnosis not present

## 2014-09-08 LAB — GLUCOSE, CAPILLARY
Glucose-Capillary: 175 mg/dL — ABNORMAL HIGH (ref 70–99)
Glucose-Capillary: 159 mg/dL — ABNORMAL HIGH (ref 70–99)
Glucose-Capillary: 177 mg/dL — ABNORMAL HIGH (ref 70–99)
Glucose-Capillary: 190 mg/dL — ABNORMAL HIGH (ref 70–99)

## 2014-09-08 LAB — URINALYSIS, ROUTINE W REFLEX MICROSCOPIC
BILIRUBIN URINE: NEGATIVE
Glucose, UA: NEGATIVE mg/dL
Ketones, ur: 15 mg/dL — AB
Nitrite: POSITIVE — AB
PH: 7.5 (ref 5.0–8.0)
Protein, ur: 100 mg/dL — AB
SPECIFIC GRAVITY, URINE: 1.024 (ref 1.005–1.030)
Urobilinogen, UA: 1 mg/dL (ref 0.0–1.0)

## 2014-09-08 LAB — DIFFERENTIAL
Basophils Absolute: 0.2 10*3/uL — ABNORMAL HIGH (ref 0.0–0.1)
Basophils Relative: 1 % (ref 0–1)
Eosinophils Absolute: 0.3 10*3/uL (ref 0.0–0.7)
Eosinophils Relative: 1 % (ref 0–5)
LYMPHS PCT: 6 % — AB (ref 12–46)
Lymphs Abs: 1.3 10*3/uL (ref 0.7–4.0)
Monocytes Absolute: 1.5 10*3/uL — ABNORMAL HIGH (ref 0.1–1.0)
Monocytes Relative: 7 % (ref 3–12)
NEUTROS ABS: 19.1 10*3/uL — AB (ref 1.7–7.7)
NEUTROS PCT: 85 % — AB (ref 43–77)

## 2014-09-08 LAB — URINE MICROSCOPIC-ADD ON

## 2014-09-08 LAB — TROPONIN I
Troponin I: 0.06 ng/mL — ABNORMAL HIGH (ref <0.031)
Troponin I: <0.03 ng/mL (ref <0.031)

## 2014-09-08 LAB — CBC
HEMATOCRIT: 39 % (ref 36.0–46.0)
HEMOGLOBIN: 12.5 g/dL (ref 12.0–15.0)
MCH: 34.9 pg — ABNORMAL HIGH (ref 26.0–34.0)
MCHC: 32.1 g/dL (ref 30.0–36.0)
MCV: 108.9 fL — ABNORMAL HIGH (ref 78.0–100.0)
Platelets: 665 10*3/uL — ABNORMAL HIGH (ref 150–400)
RBC: 3.58 MIL/uL — ABNORMAL LOW (ref 3.87–5.11)
RDW: 17.6 % — ABNORMAL HIGH (ref 11.5–15.5)
WBC: 21.5 10*3/uL — ABNORMAL HIGH (ref 4.0–10.5)

## 2014-09-08 LAB — BASIC METABOLIC PANEL
ANION GAP: 8 (ref 5–15)
BUN: 13 mg/dL (ref 6–23)
CO2: 28 mmol/L (ref 19–32)
CREATININE: 0.65 mg/dL (ref 0.50–1.10)
Calcium: 8.5 mg/dL (ref 8.4–10.5)
Chloride: 96 mmol/L (ref 96–112)
GFR calc Af Amer: >90 mL/min (ref >90)
GFR calc non Af Amer: 82 mL/min — ABNORMAL LOW (ref >90)
Glucose, Bld: 190 mg/dL — ABNORMAL HIGH (ref 70–99)
Potassium: 4.4 mmol/L (ref 3.5–5.1)
SODIUM: 132 mmol/L — AB (ref 135–145)

## 2014-09-08 LAB — HEMOGLOBIN A1C
Hgb A1c MFr Bld: 7.4 % — ABNORMAL HIGH (ref 4.8–5.6)
MEAN PLASMA GLUCOSE: 166 mg/dL

## 2014-09-08 LAB — MRSA PCR SCREENING: MRSA by PCR: NEGATIVE

## 2014-09-08 MED ORDER — CEFTRIAXONE SODIUM IN DEXTROSE 20 MG/ML IV SOLN
1.0000 g | INTRAVENOUS | Status: DC
  Administered 2014-09-08 – 2014-09-11 (×4): 1 g via INTRAVENOUS
  Filled 2014-09-08 (×4): qty 50

## 2014-09-08 MED ORDER — SODIUM CHLORIDE 0.9 % IJ SOLN
3.0000 mL | INTRAMUSCULAR | Status: DC | PRN
  Administered 2014-09-10: 3 mL via INTRAVENOUS
  Filled 2014-09-08: qty 3

## 2014-09-08 MED ORDER — SODIUM CHLORIDE 0.9 % IJ SOLN
3.0000 mL | Freq: Two times a day (BID) | INTRAMUSCULAR | Status: DC
  Administered 2014-09-08 – 2014-09-11 (×4): 3 mL via INTRAVENOUS

## 2014-09-08 MED ORDER — TRAZODONE HCL 50 MG PO TABS
25.0000 mg | ORAL_TABLET | Freq: Every evening | ORAL | Status: DC | PRN
  Administered 2014-09-08 – 2014-09-10 (×3): 25 mg via ORAL
  Filled 2014-09-08 (×4): qty 1

## 2014-09-08 MED ORDER — METOPROLOL TARTRATE 25 MG PO TABS
25.0000 mg | ORAL_TABLET | Freq: Two times a day (BID) | ORAL | Status: DC
  Administered 2014-09-08 – 2014-09-11 (×7): 25 mg via ORAL
  Filled 2014-09-08 (×8): qty 1

## 2014-09-08 MED ORDER — SODIUM CHLORIDE 0.9 % IV SOLN
250.0000 mL | INTRAVENOUS | Status: DC
  Administered 2014-09-09: 500 mL via INTRAVENOUS

## 2014-09-08 MED ORDER — CHLORHEXIDINE GLUCONATE CLOTH 2 % EX PADS
6.0000 | MEDICATED_PAD | Freq: Once | CUTANEOUS | Status: AC
  Administered 2014-09-08: 6 via TOPICAL

## 2014-09-08 MED ORDER — OFF THE BEAT BOOK
Freq: Once | Status: AC
  Administered 2014-09-08: 04:00:00
  Filled 2014-09-08: qty 1

## 2014-09-08 NOTE — Progress Notes (Signed)
Pt troponin 0.05 / 0.06, pt denies CP.  U/A positive.  Dr. Claiborne Billings notified with new orders received.  Will continue to monitor.

## 2014-09-08 NOTE — Anesthesia Preprocedure Evaluation (Addendum)
Anesthesia Evaluation  Patient identified by MRN, date of birth, ID band Patient awake    Reviewed: Allergy & Precautions, NPO status , Patient's Chart, lab work & pertinent test results, reviewed documented beta blocker date and time   Airway Mallampati: II   Neck ROM: Full    Dental  (+) Missing, Dental Advisory Given   Pulmonary neg pulmonary ROS,  breath sounds clear to auscultation        Cardiovascular hypertension, Pt. on medications + CAD + dysrhythmias Atrial Fibrillation Rhythm:Irregular  2015 ECHO 55%   Neuro/Psych negative neurological ROS     GI/Hepatic negative GI ROS, Neg liver ROS,   Endo/Other  diabetes, Type 2, Insulin Dependent  Renal/GU      Musculoskeletal   Abdominal (+)  Abdomen: soft.    Peds  Hematology   Anesthesia Other Findings   Reproductive/Obstetrics                            Anesthesia Physical Anesthesia Plan  ASA: III  Anesthesia Plan: MAC   Post-op Pain Management:    Induction: Intravenous  Airway Management Planned: Nasal Cannula  Additional Equipment:   Intra-op Plan:   Post-operative Plan:   Informed Consent: I have reviewed the patients History and Physical, chart, labs and discussed the procedure including the risks, benefits and alternatives for the proposed anesthesia with the patient or authorized representative who has indicated his/her understanding and acceptance.     Plan Discussed with:   Anesthesia Plan Comments:         Anesthesia Quick Evaluation

## 2014-09-08 NOTE — Progress Notes (Signed)
*  PRELIMINARY RESULTS* Vascular Ultrasound Left lower extremity venous duplex has been completed.  Preliminary findings: negative for DVT.   Landry Mellow, RDMS, RVT  09/08/2014, 8:23 AM

## 2014-09-08 NOTE — Progress Notes (Signed)
81 yo woman with PMH of CAD s/p CABG '02, hypertension, dyslipidemia, paroxysmal atrial fibrillation on xarelto and amiodarone who presents with feeling weak and left leg rash. She was found to be in atrial fibrillation with RVR. She tells me she's felt off/weak for 3-4 days. She also has noted swelling in her left leg and some redness. She denies infectious symptoms or travel. She is compliant with her amiodarone and xarelto. No fever/chills. No nausea/vomiting/diarrhea. At [facility] she was trialled in IV diltiazem up to 15 mg/hr, IV metoprolol 5 mg but her HR stayed between 135-150 leading to transfer to [facility].  Now on IV amiodarone drip, NPO for potential DCCV, on Xarelto.  Negative for DVT, elevated WBC most likely due to UTI.   Subjective: + SOB this AM no chest pain.  NPO   Objective: Vital signs in last 24 hours: Temp:  [97.5 F (36.4 C)-98 F (36.7 C)] 97.5 F (36.4 C) (04/04 0810) Pulse Rate:  [106-141] 124 (04/04 0810) Resp:  [18-24] 24 (04/04 0810) BP: (99-119)/(69-74) 119/74 mmHg (04/04 0810) SpO2:  [94 %-97 %] 95 % (04/04 0810) Weight:  [182 lb (82.555 kg)] 182 lb (82.555 kg) (04/04 0400) Weight change:  Last BM Date: 09/07/14 Intake/Output from previous day: +187 04/03 0701 - 04/04 0700 In: 537.4 [P.O.:240; I.V.:247.4; IV Piggyback:50] Out: 350 [Urine:350] Intake/Output this shift: Total I/O In: 0  Out: 226 [Urine:225; Stool:1]  PE: General:Pleasant affect, NAD Skin:Warm and dry, brisk capillary refill HEENT:normocephalic, sclera clear, mucus membranes moist Neck:supple, + JVD Heart:S1S2 irreg irreg without murmur, gallup, rub or click Lungs: diminished in bases, without rales, rhonchi, or wheezes NID:POEU, non tender, + BS, do not palpate liver spleen or masses Ext:no lower ext edema, 2+ pedal pulses, 2+ radial pulses, lt thigh with improved rash, mild edema Lt medial thigh.  Neuro:alert and oriented X 3, MAE, follows commands, + facial  symmetry  TELE: a fib with RVR up to 130 at times,    Lab Results:  Recent Labs  09/07/14 2028 09/08/14 0720  WBC 23.1* 21.5*  HGB 12.6 12.5  HCT 39.4 39.0  PLT 659* 665*   BMET  Recent Labs  09/07/14 2028 09/08/14 0720  NA 134* 132*  K 4.2 4.4  CL 95* 96  CO2 32 28  GLUCOSE 158* 190*  BUN 10 13  CREATININE 0.67 0.65  CALCIUM 8.3* 8.5    Recent Labs  09/08/14 0101 09/08/14 0720  TROPONINI 0.06* <0.03    No results found for: CHOL, HDL, LDLCALC, LDLDIRECT, TRIG, CHOLHDL No results found for: HGBA1C   Lab Results  Component Value Date   TSH 1.334 09/07/2014    Hepatic Function Panel  Recent Labs  09/07/14 2028  PROT 6.5  ALBUMIN 3.4*  AST 19  ALT 16  ALKPHOS 69  BILITOT 1.0   No results for input(s): CHOL in the last 72 hours. No results for input(s): PROTIME in the last 72 hours.     Studies/Results: No results found.  Medications: I have reviewed the patient's current medications. Scheduled Meds: . aspirin EC  81 mg Oral Daily  . cefTRIAXone (ROCEPHIN)  IV  1 g Intravenous Q24H  . folic acid  1 mg Oral Daily  . hydroxyurea  500 mg Oral Daily  . insulin aspart  15 Units Subcutaneous BID  . metoprolol  12.5 mg Oral Q6H  . rivaroxaban  20 mg Oral Daily  . sertraline  50 mg Oral Daily  Continuous Infusions: . amiodarone 30 mg/hr (09/08/14 0646)   PRN Meds:.acetaminophen, hydrOXYzine, ondansetron (ZOFRAN) IV, traMADol  Assessment/Plan: Atrial fib with RVR, last Echo 02/2014 with EF 60-65% RV pressure of 36 mmHg, LA mildly dilated., now on IV amiodarone, NPO for possible DCCV  Rate not well controlled, on IV amiodarone and BB 12.5 mg every 6 hours.   Hx PAF on home amiodarone at 100 mg daily and Xarelto, now IV amiodarone   CHAD2S2VASc score 6  CAD with hx of CABG  2002 Last Nuc 2013, no reversibility no cath that I can find in notes.  Mild troponin elevation on admit but now normal.   HTN  UTI now on rocephin,  Rash on leg--neg.  DVT.  HLD- on no meds     LOS: 1 day   Time spent with pt. :15 minutes. [facility] R  Nurse Practitioner Certified Pager [phone] or after 5pm and on weekends call [phone] 09/08/2014, 9:30 AM

## 2014-09-09 ENCOUNTER — Encounter (HOSPITAL_COMMUNITY): Payer: Self-pay | Admitting: *Deleted

## 2014-09-09 ENCOUNTER — Inpatient Hospital Stay (HOSPITAL_COMMUNITY): Payer: Medicare Other | Admitting: Anesthesiology

## 2014-09-09 ENCOUNTER — Encounter (HOSPITAL_COMMUNITY)
Admission: AD | Disposition: A | Discharge status: Home / Self Care | Payer: Self-pay | Source: Other Acute Inpatient Hospital | Attending: Cardiology

## 2014-09-09 DIAGNOSIS — I4891 Unspecified atrial fibrillation: Secondary | ICD-10-CM

## 2014-09-09 HISTORY — PX: CARDIOVERSION: SHX1299

## 2014-09-09 LAB — GLUCOSE, CAPILLARY
GLUCOSE-CAPILLARY: 196 mg/dL — AB (ref 70–99)
GLUCOSE-CAPILLARY: 176 mg/dL — AB (ref 70–99)
Glucose-Capillary: 179 mg/dL — ABNORMAL HIGH (ref 70–99)
Glucose-Capillary: 194 mg/dL — ABNORMAL HIGH (ref 70–99)

## 2014-09-09 LAB — CBC
HEMATOCRIT: 38.1 % (ref 36.0–46.0)
Hemoglobin: 12.2 g/dL (ref 12.0–15.0)
MCH: 34.7 pg — AB (ref 26.0–34.0)
MCHC: 32 g/dL (ref 30.0–36.0)
MCV: 108.2 fL — AB (ref 78.0–100.0)
Platelets: 664 10*3/uL — ABNORMAL HIGH (ref 150–400)
RBC: 3.52 MIL/uL — ABNORMAL LOW (ref 3.87–5.11)
RDW: 17.5 % — ABNORMAL HIGH (ref 11.5–15.5)
WBC: 16.8 10*3/uL — AB (ref 4.0–10.5)

## 2014-09-09 LAB — BASIC METABOLIC PANEL
Anion gap: 7 (ref 5–15)
BUN: 12 mg/dL (ref 6–23)
CHLORIDE: 94 mmol/L — AB (ref 96–112)
CO2: 31 mmol/L (ref 19–32)
Calcium: 8.4 mg/dL (ref 8.4–10.5)
Creatinine, Ser: 0.6 mg/dL (ref 0.50–1.10)
GFR calc Af Amer: >90 mL/min (ref >90)
GFR calc non Af Amer: 84 mL/min — ABNORMAL LOW (ref >90)
GLUCOSE: 210 mg/dL — AB (ref 70–99)
Potassium: 4.1 mmol/L (ref 3.5–5.1)
Sodium: 132 mmol/L — ABNORMAL LOW (ref 135–145)

## 2014-09-09 LAB — LIPID PANEL
CHOLESTEROL: 145 mg/dL (ref 0–200)
HDL: 29 mg/dL — ABNORMAL LOW (ref >39)
LDL CALC: 100 mg/dL — AB (ref 0–99)
TRIGLYCERIDES: 79 mg/dL (ref <150)
Total CHOL/HDL Ratio: 5 RATIO
VLDL: 16 mg/dL (ref 0–40)

## 2014-09-09 SURGERY — CARDIOVERSION
Anesthesia: Monitor Anesthesia Care

## 2014-09-09 MED ORDER — MEPERIDINE HCL 25 MG/ML IJ SOLN: 6.2500 mg | INTRAMUSCULAR | Status: DC | PRN

## 2014-09-09 MED ORDER — INSULIN ASPART 100 UNIT/ML ~~LOC~~ SOLN
0.0000 [IU] | Freq: Three times a day (TID) | SUBCUTANEOUS | Status: DC
  Administered 2014-09-09 – 2014-09-10 (×5): 2 [IU] via SUBCUTANEOUS
  Administered 2014-09-11: 3 [IU] via SUBCUTANEOUS
  Administered 2014-09-11: 1 [IU] via SUBCUTANEOUS

## 2014-09-09 MED ORDER — FENTANYL CITRATE 0.05 MG/ML IJ SOLN: 25.0000 ug | INTRAMUSCULAR | Status: DC | PRN

## 2014-09-09 MED ORDER — AMIODARONE HCL 200 MG PO TABS
400.0000 mg | ORAL_TABLET | Freq: Every day | ORAL | Status: DC
  Administered 2014-09-09 – 2014-09-11 (×3): 400 mg via ORAL
  Filled 2014-09-09 (×4): qty 2

## 2014-09-09 MED ORDER — INSULIN ASPART 100 UNIT/ML ~~LOC~~ SOLN: 0.0000 [IU] | Freq: Every day | SUBCUTANEOUS | Status: DC

## 2014-09-09 MED ORDER — LIDOCAINE HCL (CARDIAC) 20 MG/ML IV SOLN
INTRAVENOUS | Status: DC | PRN
  Administered 2014-09-09: 40 mg via INTRAVENOUS

## 2014-09-09 MED ORDER — PROPOFOL 10 MG/ML IV BOLUS
INTRAVENOUS | Status: DC | PRN
  Administered 2014-09-09: 50 mg via INTRAVENOUS

## 2014-09-09 MED ORDER — SODIUM CHLORIDE 0.9 % IV SOLN
INTRAVENOUS | Status: DC | PRN
  Administered 2014-09-09: 09:00:00 via INTRAVENOUS

## 2014-09-09 MED ORDER — PHENYLEPHRINE HCL 10 MG/ML IJ SOLN
INTRAMUSCULAR | Status: DC | PRN
  Administered 2014-09-09 (×2): 80 ug via INTRAVENOUS

## 2014-09-09 MED ORDER — PROMETHAZINE HCL 25 MG/ML IJ SOLN: 6.2500 mg | INTRAMUSCULAR | Status: DC | PRN

## 2014-09-09 MED ORDER — AMIODARONE HCL 200 MG PO TABS: 400.0000 mg | ORAL_TABLET | Freq: Every day | ORAL | Status: DC

## 2014-09-09 NOTE — Interval H&P Note (Signed)
History and Physical Interval Note:  09/09/2014 8:02 AM  Theresa Bird  has presented today for surgery, with the diagnosis of AFIB  The various methods of treatment have been discussed with the patient and family. After consideration of risks, benefits and other options for treatment, the patient has consented to  Procedure(s): CARDIOVERSION (N/A) as a surgical intervention .  The patient's history has been reviewed, patient examined, no change in status, stable for surgery.  I have reviewed the patient's chart and labs.  Questions were answered to the patient's satisfaction.     [name]

## 2014-09-09 NOTE — Transfer of Care (Signed)
Immediate Anesthesia Transfer of Care Note  Patient: Theresa Bird  Procedure(s) Performed: Procedure(s): CARDIOVERSION (N/A)  Patient Location: PACU and Endoscopy Unit  Anesthesia Type:MAC  Level of Consciousness: awake, alert , oriented and patient cooperative  Airway & Oxygen Therapy: Patient Spontanous Breathing and Patient connected to nasal cannula oxygen  Post-op Assessment: Report given to RN, Post -op Vital signs reviewed and stable and Patient moving all extremities  Post vital signs: Reviewed and stable  Last Vitals:  Filed Vitals:   09/09/14 0933  BP:   Pulse:   Temp:   Resp: 17    Complications: No apparent anesthesia complications

## 2014-09-09 NOTE — Progress Notes (Addendum)
Patient urine bloody, she says "its been like that"   Spoke with Dr. Colon Flattery about this, will order CBC for tomorrow and continue to monitor

## 2014-09-09 NOTE — CV Procedure (Signed)
DCC: Afib rate 110 on iv amiodarone and xarelto Anesthesia:  50 mg propofol 40 mg lidocaine  DCC x 1 120 J biphasic  NSR rate 70  No immediate neurologic sequelae  [name]

## 2014-09-09 NOTE — Anesthesia Postprocedure Evaluation (Signed)
  Anesthesia Post-op Note  Patient: Theresa Bird  Procedure(s) Performed: Procedure(s): CARDIOVERSION (N/A)  Patient Location: PACU  Anesthesia Type:MAC  Level of Consciousness: awake  Airway and Oxygen Therapy: Patient Spontanous Breathing and Patient connected to nasal cannula oxygen  Post-op Pain: none  Post-op Assessment: Post-op Vital signs reviewed  Post-op Vital Signs: Reviewed and stable  Last Vitals:  Filed Vitals:   09/09/14 0856  BP: 115/94  Pulse:   Temp: 36.4 C  Resp: 23    Complications: No apparent anesthesia complications

## 2014-09-09 NOTE — H&P (View-Only) (Signed)
81 yo woman with PMH of CAD s/p CABG '02, hypertension, dyslipidemia, paroxysmal atrial fibrillation on xarelto and amiodarone who presents with feeling weak and left leg rash. She was found to be in atrial fibrillation with RVR. She tells me she's felt off/weak for 3-4 days. She also has noted swelling in her left leg and some redness. She denies infectious symptoms or travel. She is compliant with her amiodarone and xarelto. No fever/chills. No nausea/vomiting/diarrhea. At [facility] she was trialled in IV diltiazem up to 15 mg/hr, IV metoprolol 5 mg but her HR stayed between 135-150 leading to transfer to [facility].  Now on IV amiodarone drip, NPO for potential DCCV, on Xarelto.  Negative for DVT, elevated WBC most likely due to UTI.   Subjective: + SOB this AM no chest pain.  NPO   Objective: Vital signs in last 24 hours: Temp:  [97.5 F (36.4 C)-98 F (36.7 C)] 97.5 F (36.4 C) (04/04 0810) Pulse Rate:  [106-141] 124 (04/04 0810) Resp:  [18-24] 24 (04/04 0810) BP: (99-119)/(69-74) 119/74 mmHg (04/04 0810) SpO2:  [94 %-97 %] 95 % (04/04 0810) Weight:  [182 lb (82.555 kg)] 182 lb (82.555 kg) (04/04 0400) Weight change:  Last BM Date: 09/07/14 Intake/Output from previous day: +187 04/03 0701 - 04/04 0700 In: 537.4 [P.O.:240; I.V.:247.4; IV Piggyback:50] Out: 350 [Urine:350] Intake/Output this shift: Total I/O In: 0  Out: 226 [Urine:225; Stool:1]  PE: General:Pleasant affect, NAD Skin:Warm and dry, brisk capillary refill HEENT:normocephalic, sclera clear, mucus membranes moist Neck:supple, + JVD Heart:S1S2 irreg irreg without murmur, gallup, rub or click Lungs: diminished in bases, without rales, rhonchi, or wheezes EXH:BZJI, non tender, + BS, do not palpate liver spleen or masses Ext:no lower ext edema, 2+ pedal pulses, 2+ radial pulses, lt thigh with improved rash, mild edema Lt medial thigh.  Neuro:alert and oriented X 3, MAE, follows commands, + facial  symmetry  TELE: a fib with RVR up to 130 at times,    Lab Results:  Recent Labs  09/07/14 2028 09/08/14 0720  WBC 23.1* 21.5*  HGB 12.6 12.5  HCT 39.4 39.0  PLT 659* 665*   BMET  Recent Labs  09/07/14 2028 09/08/14 0720  NA 134* 132*  K 4.2 4.4  CL 95* 96  CO2 32 28  GLUCOSE 158* 190*  BUN 10 13  CREATININE 0.67 0.65  CALCIUM 8.3* 8.5    Recent Labs  09/08/14 0101 09/08/14 0720  TROPONINI 0.06* <0.03    No results found for: CHOL, HDL, LDLCALC, LDLDIRECT, TRIG, CHOLHDL No results found for: HGBA1C   Lab Results  Component Value Date   TSH 1.334 09/07/2014    Hepatic Function Panel  Recent Labs  09/07/14 2028  PROT 6.5  ALBUMIN 3.4*  AST 19  ALT 16  ALKPHOS 69  BILITOT 1.0   No results for input(s): CHOL in the last 72 hours. No results for input(s): PROTIME in the last 72 hours.     Studies/Results: No results found.  Medications: I have reviewed the patient's current medications. Scheduled Meds: . aspirin EC  81 mg Oral Daily  . cefTRIAXone (ROCEPHIN)  IV  1 g Intravenous Q24H  . folic acid  1 mg Oral Daily  . hydroxyurea  500 mg Oral Daily  . insulin aspart  15 Units Subcutaneous BID  . metoprolol  12.5 mg Oral Q6H  . rivaroxaban  20 mg Oral Daily  . sertraline  50 mg Oral Daily  Continuous Infusions: . amiodarone 30 mg/hr (09/08/14 0646)   PRN Meds:.acetaminophen, hydrOXYzine, ondansetron (ZOFRAN) IV, traMADol  Assessment/Plan: Atrial fib with RVR, last Echo 02/2014 with EF 60-65% RV pressure of 36 mmHg, LA mildly dilated., now on IV amiodarone, NPO for possible DCCV  Rate not well controlled, on IV amiodarone and BB 12.5 mg every 6 hours.   Hx PAF on home amiodarone at 100 mg daily and Xarelto, now IV amiodarone   CHAD2S2VASc score 6  CAD with hx of CABG  2002 Last Nuc 2013, no reversibility no cath that I can find in notes.  Mild troponin elevation on admit but now normal.   HTN  UTI now on rocephin,  Rash on leg--neg.  DVT.  HLD- on no meds     LOS: 1 day   Time spent with pt. :15 minutes. [facility] R  Nurse Practitioner Certified Pager [phone] or after 5pm and on weekends call [phone] 09/08/2014, 9:30 AM

## 2014-09-09 NOTE — Progress Notes (Signed)
81 yo woman with PMH of CAD s/p CABG '02, hypertension, dyslipidemia, paroxysmal atrial fibrillation on xarelto and amiodarone who presents with feeling weak and left leg rash. She was found to be in atrial fibrillation with RVR. She tells me she's felt off/weak for 3-4 days. She also has noted swelling in her left leg and some redness. She denies infectious symptoms or travel. She is compliant with her amiodarone and xarelto. No fever/chills. No nausea/vomiting/diarrhea. At [facility] she was trialled in IV diltiazem up to 15 mg/hr, IV metoprolol 5 mg but her HR stayed between 135-150 leading to transfer to [facility]. Now on IV amiodarone drip, NPO for potential DCCV, on Xarelto. Negative for DVT, elevated WBC most likely due to UTI.   Subjective: No chest pain, breathing about the same.   Objective: Vital signs in last 24 hours: Temp:  [97.4 F (36.3 C)-98.5 F (36.9 C)] 98.2 F (36.8 C) (04/05 0400) Pulse Rate:  [116-129] 116 (04/05 0400) Resp:  [17-24] 17 (04/05 0400) BP: (106-126)/(69-85) 120/76 mmHg (04/05 0400) SpO2:  [95 %-98 %] 95 % (04/05 0400) Weight:  [181 lb 4.8 oz (82.237 kg)] 181 lb 4.8 oz (82.237 kg) (04/05 0400) Weight change: -11.2 oz (-0.318 kg) Last BM Date: 09/07/14 Intake/Output from previous day: -481 04/04 0701 - 04/05 0700 In: 720 [P.O.:720] Out: 1201 [Urine:1200; Stool:1] Intake/Output this shift:    PE: General:Pleasant affect, NAD Skin:Warm and dry, brisk capillary refill HEENT:normocephalic, sclera clear, mucus membranes moist Heart:irreg irreg with soft murmur, gallup, rub or click Lungs:clear though diminished in bases but improved from yesterday, few crackles, no rhonchi, or wheezes RWE:RXVQ, non tender, + BS, do not palpate liver spleen or masses Ext:no lower ext edema, 2+ pedal pulses, 2+ radial pulses, Lt thigh mild redness no warmth no pain Neuro:alert and oriented, MAE, follows commands, + facial symmetry Tele:  A fib with RVR HR mostly  in 110-120s though at times still up to 140   Lab Results:  Recent Labs  09/07/14 2028 09/08/14 0720  WBC 23.1* 21.5*  HGB 12.6 12.5  HCT 39.4 39.0  PLT 659* 665*   BMET  Recent Labs  09/07/14 2028 09/08/14 0720  NA 134* 132*  K 4.2 4.4  CL 95* 96  CO2 32 28  GLUCOSE 158* 190*  BUN 10 13  CREATININE 0.67 0.65  CALCIUM 8.3* 8.5    Recent Labs  09/08/14 0101 09/08/14 0720  TROPONINI 0.06* <0.03    No results found for: CHOL, HDL, LDLCALC, LDLDIRECT, TRIG, CHOLHDL Lab Results  Component Value Date   HGBA1C 7.4* 09/07/2014     Lab Results  Component Value Date   TSH 1.334 09/07/2014    Hepatic Function Panel  Recent Labs  09/07/14 2028  PROT 6.5  ALBUMIN 3.4*  AST 19  ALT 16  ALKPHOS 69  BILITOT 1.0   No results for input(s): CHOL in the last 72 hours. No results for input(s): PROTIME in the last 72 hours.     Studies/Results: No results found.  Medications: I have reviewed the patient's current medications. Scheduled Meds: . aspirin EC  81 mg Oral Daily  . cefTRIAXone (ROCEPHIN)  IV  1 g Intravenous Q24H  . folic acid  1 mg Oral Daily  . hydroxyurea  500 mg Oral Daily  . insulin aspart  15 Units Subcutaneous BID  . metoprolol  25 mg Oral BID  . rivaroxaban  20 mg Oral Daily  . sertraline  50 mg  Oral Daily  . sodium chloride  3 mL Intravenous Q12H   Continuous Infusions: . sodium chloride    . amiodarone 30 mg/hr (09/09/14 0523)   PRN Meds:.acetaminophen, hydrOXYzine, ondansetron (ZOFRAN) IV, sodium chloride, traMADol, traZODone  Assessment/Plan: Atrial fib with RVR, last Echo 02/2014 with EF 60-65% RV pressure of 36 mmHg, LA mildly dilated., now on IV amiodarone, NPO for  DCCVtoday  Rate not well controlled, on IV amiodarone and BB 25 mg BID.   Hx PAF on home amiodarone at 100 mg daily and Xarelto, now IV amiodarone CHAD2S2VASc score 6  CAD with hx of CABG 2002 Last Nuc 2013, no reversibility no cath that I can find in notes.  Mild troponin elevation on admit but now normal.   HTN- stable  UTI now on rocephin,  Rash on leg--neg. DVT.  HLD- on no meds  DM-2 stable, on home insulin and SSI   LOS: 2 days   Time spent with pt. :15 minutes. [facility] R  Nurse Practitioner Certified Pager [phone] or after 5pm and on weekends call [phone] 09/09/2014, 7:51 AM

## 2014-09-10 ENCOUNTER — Encounter (HOSPITAL_COMMUNITY): Payer: Self-pay | Admitting: Cardiovascular Disease

## 2014-09-10 DIAGNOSIS — E785 Hyperlipidemia, unspecified: Secondary | ICD-10-CM

## 2014-09-10 DIAGNOSIS — Z113 Encounter for screening for infections with a predominantly sexual mode of transmission: Secondary | ICD-10-CM | POA: Insufficient documentation

## 2014-09-10 DIAGNOSIS — D473 Essential (hemorrhagic) thrombocythemia: Secondary | ICD-10-CM | POA: Insufficient documentation

## 2014-09-10 DIAGNOSIS — D7581 Myelofibrosis: Secondary | ICD-10-CM | POA: Insufficient documentation

## 2014-09-10 DIAGNOSIS — I808 Phlebitis and thrombophlebitis of other sites: Secondary | ICD-10-CM

## 2014-09-10 DIAGNOSIS — I809 Phlebitis and thrombophlebitis of unspecified site: Secondary | ICD-10-CM | POA: Insufficient documentation

## 2014-09-10 DIAGNOSIS — R8290 Unspecified abnormal findings in urine: Secondary | ICD-10-CM

## 2014-09-10 DIAGNOSIS — L03114 Cellulitis of left upper limb: Secondary | ICD-10-CM

## 2014-09-10 DIAGNOSIS — D72829 Elevated white blood cell count, unspecified: Secondary | ICD-10-CM

## 2014-09-10 LAB — GLUCOSE, CAPILLARY
Glucose-Capillary: 159 mg/dL — ABNORMAL HIGH (ref 70–99)
Glucose-Capillary: 180 mg/dL — ABNORMAL HIGH (ref 70–99)
Glucose-Capillary: 153 mg/dL — ABNORMAL HIGH (ref 70–99)

## 2014-09-10 LAB — BASIC METABOLIC PANEL
Anion gap: 8 (ref 5–15)
BUN: 8 mg/dL (ref 6–23)
CO2: 31 mmol/L (ref 19–32)
CREATININE: 0.66 mg/dL (ref 0.50–1.10)
Calcium: 8.3 mg/dL — ABNORMAL LOW (ref 8.4–10.5)
Chloride: 93 mmol/L — ABNORMAL LOW (ref 96–112)
GFR calc Af Amer: >90 mL/min (ref >90)
GFR, EST NON AFRICAN AMERICAN: 82 mL/min — AB (ref >90)
GLUCOSE: 195 mg/dL — AB (ref 70–99)
Potassium: 4.4 mmol/L (ref 3.5–5.1)
Sodium: 132 mmol/L — ABNORMAL LOW (ref 135–145)

## 2014-09-10 LAB — CBC WITH DIFFERENTIAL/PLATELET
Basophils Absolute: 0.1 10*3/uL (ref 0.0–0.1)
Basophils Relative: 1 % (ref 0–1)
EOS ABS: 0.4 10*3/uL (ref 0.0–0.7)
Eosinophils Relative: 2 % (ref 0–5)
HCT: 36 % (ref 36.0–46.0)
Hemoglobin: 11.6 g/dL — ABNORMAL LOW (ref 12.0–15.0)
LYMPHS ABS: 1 10*3/uL (ref 0.7–4.0)
Lymphocytes Relative: 6 % — ABNORMAL LOW (ref 12–46)
MCH: 35.3 pg — AB (ref 26.0–34.0)
MCHC: 32.2 g/dL (ref 30.0–36.0)
MCV: 109.4 fL — AB (ref 78.0–100.0)
MONO ABS: 1.3 10*3/uL — AB (ref 0.1–1.0)
MONOS PCT: 8 % (ref 3–12)
NEUTROS PCT: 83 % — AB (ref 43–77)
Neutro Abs: 12.8 10*3/uL — ABNORMAL HIGH (ref 1.7–7.7)
Platelets: 610 10*3/uL — ABNORMAL HIGH (ref 150–400)
RBC: 3.29 MIL/uL — ABNORMAL LOW (ref 3.87–5.11)
RDW: 17.1 % — ABNORMAL HIGH (ref 11.5–15.5)
WBC: 15.4 10*3/uL — ABNORMAL HIGH (ref 4.0–10.5)

## 2014-09-10 LAB — URINALYSIS, ROUTINE W REFLEX MICROSCOPIC
BILIRUBIN URINE: NEGATIVE
GLUCOSE, UA: NEGATIVE mg/dL
KETONES UR: NEGATIVE mg/dL
Nitrite: NEGATIVE
PH: 6 (ref 5.0–8.0)
PROTEIN: NEGATIVE mg/dL
Specific Gravity, Urine: 1.009 (ref 1.005–1.030)
Urobilinogen, UA: 1 mg/dL (ref 0.0–1.0)

## 2014-09-10 LAB — URINE MICROSCOPIC-ADD ON

## 2014-09-10 MED ORDER — ATORVASTATIN CALCIUM 20 MG PO TABS
20.0000 mg | ORAL_TABLET | Freq: Every day | ORAL | Status: DC
  Administered 2014-09-10: 20 mg via ORAL
  Filled 2014-09-10: qty 1

## 2014-09-10 NOTE — Consult Note (Addendum)
[facility]    Date of Admission:  09/07/2014  Date of Consult:  09/10/2014  Reason for Consult: ? Left arm cellulitis, phlebitis, UTI Referring Physician: Dr. Rennis Golden   HPI: Theresa Bird is an 81 y.o. female with complicated PMHx including CAD sp CAGB, PAF, who presented to ED at [facility] initially for a rash that has subsequently resolved. She was started on diltiazem at [facility], Metoprolol transferrred to [facility] and subsequently cardioverted.  In the inteirm she had UA checked despite this NOT being her presenting complaint she now states that she has had persistent dysuria. Her UA showed pyuria and she was started on rocephin but no urine culture done. While on rocephin she has developed an area of erythema and tenderness in her left AC fossa concerning for phlebitis +/- cellulitis.  On arrival in ED she was found to be in AF w RVR   Past Medical History  Diagnosis Date  . Depression   . Essential hypertension, benign   . Hypercholesteremia   . Insulin dependent diabetes mellitus   . 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 Tawnya Crook  . Coronary atherosclerosis of native coronary artery     Multivessel status post CABG in [place]  . Atrial fibrillation   . Myelofibrosis     Past Surgical History  Procedure Laterality Date  . Abdominal hysterectomy    . Cataract extraction    . Coronary artery bypass graft      Possibly 2002 in [place]  . Cardioversion N/A 09/09/2014    Procedure: CARDIOVERSION;  Surgeon: Wendall Stade, MD;  Location: [facility] ENDOSCOPY;  Service: Cardiovascular;  Laterality: N/A;  ergies:   No Known Allergies   Medications: I have reviewed patients current medications as documented in Epic Anti-infectives    Start     Dose/Rate Route Frequency Ordered Stop   09/08/14 0330  cefTRIAXone (ROCEPHIN) 1 g in dextrose 5 % 50 mL IVPB - Premix     1 g 100 mL/hr over 30 Minutes Intravenous Every 24 hours 09/08/14 0314         Social History:  reports that she has never smoked. She has never used smokeless tobacco. She reports that she drinks alcohol. She reports that she does not use illicit drugs.  Family History  Problem Relation Age of Onset  . Coronary artery disease      As in HPI and primary teams notes otherwise 12 point review of systems is negative  Blood pressure 124/55, pulse 62, temperature 98.2 F (36.8 C), temperature source Oral, resp. rate 18, height 5\' 9"  (1.753 m), weight 183 lb 1.6 oz (83.054 kg), SpO2 93 %. General: Alert and awake, oriented x3, not in any acute distress. HEENT: anicteric sclera, pupils reactive to light and accommodation, EOMI, oropharynx clear and without exudate CVS regular rate, normal r,  no murmur rubs or gallops Chest: clear to auscultation bilaterally, no wheezing, rales or rhonchi Abdomen: soft nontender, nondistended, normal bowel sounds,  Left AC fossa erythematous raised:  09/10/14:       Neuro: nonfocal, strength and sensation intact   Results for orders placed or performed during the hospital encounter of 09/07/14 (from the past 48 hour(s))  Glucose, capillary     Status: Abnormal   Collection Time: 09/08/14  8:57 PM  Result Value Ref Range   Glucose-Capillary 190 (H) 70 - 99 mg/dL  Glucose, capillary     Status: Abnormal   Collection  Time: 09/09/14  8:31 AM  Result Value Ref Range   Glucose-Capillary 196 (H) 70 - 99 mg/dL  Glucose, capillary     Status: Abnormal   Collection Time: 09/09/14 11:50 AM  Result Value Ref Range   Glucose-Capillary 179 (H) 70 - 99 mg/dL  Basic metabolic panel     Status: Abnormal   Collection Time: 09/09/14  1:50 PM  Result Value Ref Range   Sodium 132 (L) 135 - 145 mmol/L   Potassium 4.1 3.5 - 5.1 mmol/L   Chloride 94 (L) 96 - 112 mmol/L   CO2 31 19 - 32 mmol/L   Glucose, Bld 210 (H) 70 - 99 mg/dL   BUN 12 6 - 23 mg/dL   Creatinine, Ser 0.60 0.50 - 1.10 mg/dL   Calcium 8.4 8.4 - 10.5 mg/dL   GFR  calc non Af Amer 84 (L) >90 mL/min   GFR calc Af Amer >90 >90 mL/min    Comment: (NOTE) The eGFR has been calculated using the CKD EPI equation. This calculation has not been validated in all clinical situations. eGFR's persistently <90 mL/min signify possible Chronic Kidney Disease.    Anion gap 7 5 - 15  CBC     Status: Abnormal   Collection Time: 09/09/14  1:50 PM  Result Value Ref Range   WBC 16.8 (H) 4.0 - 10.5 K/uL   RBC 3.52 (L) 3.87 - 5.11 MIL/uL   Hemoglobin 12.2 12.0 - 15.0 g/dL   HCT 38.1 36.0 - 46.0 %   MCV 108.2 (H) 78.0 - 100.0 fL   MCH 34.7 (H) 26.0 - 34.0 pg   MCHC 32.0 30.0 - 36.0 g/dL   RDW 17.5 (H) 11.5 - 15.5 %   Platelets 664 (H) 150 - 400 K/uL  Lipid panel     Status: Abnormal   Collection Time: 09/09/14  1:50 PM  Result Value Ref Range   Cholesterol 145 0 - 200 mg/dL   Triglycerides 79 <150 mg/dL   HDL 29 (L) >39 mg/dL   Total CHOL/HDL Ratio 5.0 RATIO   VLDL 16 0 - 40 mg/dL   LDL Cholesterol 100 (H) 0 - 99 mg/dL    Comment:        Total Cholesterol/HDL:CHD Risk Coronary Heart Disease Risk Table                     Men   Women  1/2 Average Risk   3.4   3.3  Average Risk       5.0   4.4  2 X Average Risk   9.6   7.1  3 X Average Risk  23.4   11.0        Use the calculated Patient Ratio above and the CHD Risk Table to determine the patient's CHD Risk.        ATP III CLASSIFICATION (LDL):  <100     mg/dL   Optimal  100-129  mg/dL   Near or Above                    Optimal  130-159  mg/dL   Borderline  160-189  mg/dL   High  >190     mg/dL   Very High   Glucose, capillary     Status: Abnormal   Collection Time: 09/09/14  5:02 PM  Result Value Ref Range   Glucose-Capillary 176 (H) 70 - 99 mg/dL  Glucose, capillary     Status: Abnormal  Collection Time: 09/09/14  8:49 PM  Result Value Ref Range   Glucose-Capillary 194 (H) 70 - 99 mg/dL   Comment 1 Notify RN   Glucose, capillary     Status: Abnormal   Collection Time: 09/10/14  7:44 AM    Result Value Ref Range   Glucose-Capillary 159 (H) 70 - 99 mg/dL   Comment 1 Notify RN    Comment 2 Document in Chart   Glucose, capillary     Status: Abnormal   Collection Time: 09/10/14 11:40 AM  Result Value Ref Range   Glucose-Capillary 180 (H) 70 - 99 mg/dL   Comment 1 Notify RN    Comment 2 Document in Chart   Basic metabolic panel     Status: Abnormal   Collection Time: 09/10/14 12:00 PM  Result Value Ref Range   Sodium 132 (L) 135 - 145 mmol/L   Potassium 4.4 3.5 - 5.1 mmol/L   Chloride 93 (L) 96 - 112 mmol/L   CO2 31 19 - 32 mmol/L   Glucose, Bld 195 (H) 70 - 99 mg/dL   BUN 8 6 - 23 mg/dL   Creatinine, Ser 0.66 0.50 - 1.10 mg/dL   Calcium 8.3 (L) 8.4 - 10.5 mg/dL   GFR calc non Af Amer 82 (L) >90 mL/min   GFR calc Af Amer >90 >90 mL/min    Comment: (NOTE) The eGFR has been calculated using the CKD EPI equation. This calculation has not been validated in all clinical situations. eGFR's persistently <90 mL/min signify possible Chronic Kidney Disease.    Anion gap 8 5 - 15  CBC WITH DIFFERENTIAL     Status: Abnormal   Collection Time: 09/10/14 12:00 PM  Result Value Ref Range   WBC 15.4 (H) 4.0 - 10.5 K/uL   RBC 3.29 (L) 3.87 - 5.11 MIL/uL   Hemoglobin 11.6 (L) 12.0 - 15.0 g/dL   HCT 36.0 36.0 - 46.0 %   MCV 109.4 (H) 78.0 - 100.0 fL   MCH 35.3 (H) 26.0 - 34.0 pg   MCHC 32.2 30.0 - 36.0 g/dL   RDW 17.1 (H) 11.5 - 15.5 %   Platelets 610 (H) 150 - 400 K/uL   Neutrophils Relative % 83 (H) 43 - 77 %   Neutro Abs 12.8 (H) 1.7 - 7.7 K/uL   Lymphocytes Relative 6 (L) 12 - 46 %   Lymphs Abs 1.0 0.7 - 4.0 K/uL   Monocytes Relative 8 3 - 12 %   Monocytes Absolute 1.3 (H) 0.1 - 1.0 K/uL   Eosinophils Relative 2 0 - 5 %   Eosinophils Absolute 0.4 0.0 - 0.7 K/uL   Basophils Relative 1 0 - 1 %   Basophils Absolute 0.1 0.0 - 0.1 K/uL  Glucose, capillary     Status: Abnormal   Collection Time: 09/10/14  4:38 PM  Result Value Ref Range   Glucose-Capillary 153 (H) 70 - 99  mg/dL   Comment 1 Notify RN    Comment 2 Document in Chart    '@BRIEFLABTABLE'$ (sdes,specrequest,cult,reptstatus)   ) Recent Results (from the past 720 hour(s))  MRSA PCR Screening     Status: None   Collection Time: 09/08/14  4:47 AM  Result Value Ref Range Status   MRSA by PCR NEGATIVE NEGATIVE Final    Comment:        The GeneXpert MRSA Assay (FDA approved for NASAL specimens only), is one component of a comprehensive MRSA colonization surveillance program. It is not intended to diagnose  MRSA infection nor to guide or monitor treatment for MRSA infections.      Impression/Recommendation  Principal Problem:   Atrial fibrillation with RVR Active Problems:   DM2 (diabetes mellitus, type 2)   Dyslipidemia   CORONARY ATHEROSCLEROSIS NATIVE CORONARY ARTERY   Essential hypertension   Cellulitis of left upper arm   UTI (urinary tract infection)   Theresa Bird is a 81 y.o. female with admission for AF w RVR sp cardioversion being rx for UTI, and with what appears to be phlebitis  #1 Phlebitis:  --will check Doppler to see if there is DVT and /or any possible deeper infection  --warm compresses --not clear to me that she needs broader coverage, though would not be opposed to going to bactrim at DC (would not go with clindamycin given risk of CDI --if no clot found and arm worsens would get MRI to investigate for abscess (though this seems like a phlebitis)  #2 ? UTI: --recheck UA and culture, already had 3 days of abx  #3 Screening: check HIV and HCV  #4 Leukocytosis: her baseline WBC seems to be at least 10k when reviewing her heme/onc notes she has ET and myelofibrosis  Dr. Johnnye Sima is covering tomorrow through Sunday.      09/10/2014, 8:22 PM   Thank you so much for this interesting consult  [facility] [phone] (pager) [phone] (office) 09/10/2014, 8:22 PM  Rhina [place] 09/10/2014, 8:22 PM

## 2014-09-10 NOTE — Progress Notes (Signed)
81 yo woman with PMH of CAD s/p CABG '02, hypertension, dyslipidemia, paroxysmal atrial fibrillation on xarelto and amiodarone who presents with feeling weak and left leg rash. She was found to be in atrial fibrillation with RVR. She tells me she's felt off/weak for 3-4 days. She also has noted swelling in her left leg and some redness. She denies infectious symptoms or travel. She is compliant with her amiodarone and xarelto. No fever/chills. No nausea/vomiting/diarrhea. At [facility] she was trialled in IV diltiazem up to 15 mg/hr, IV metoprolol 5 mg but her HR stayed between 135-150 leading to transfer to [facility]. Now on IV amiodarone drip, NPO for potential DCCV, on Xarelto. Negative for DVT, elevated WBC most likely due to UTI.   Subjective: No chest pain, breathing about the same.   Objective: Vital signs in last 24 hours: Temp:  [97.8 F (36.6 C)-98.3 F (36.8 C)] 97.8 F (36.6 C) (04/06 0400) Pulse Rate:  [62-71] 62 (04/06 0400) Resp:  [18] 18 (04/06 0400) BP: (118-127)/(57) 121/57 mmHg (04/06 0400) SpO2:  [98 %-100 %] 100 % (04/06 0400) Weight:  [183 lb 1.6 oz (83.054 kg)] 183 lb 1.6 oz (83.054 kg) (04/06 0400) Weight change: 1 lb 12.8 oz (0.816 kg) Last BM Date: 09/09/14 Intake/Output from previous day: -481 04/05 0701 - 04/06 0700 In: 550 [I.V.:550] Out: 1250 [Urine:1250] Intake/Output this shift:    PE: General:Pleasant affect, NAD Skin:Warm and dry, brisk capillary refill HEENT:normocephalic, sclera clear, mucus membranes moist Heart:irreg irreg with soft murmur, gallup, rub or click Lungs:clear though diminished in bases but improved from yesterday, few crackles, no rhonchi, or wheezes AUQ:JFHL, non tender, + BS, do not palpate liver spleen or masses Ext:no lower ext edema, 2+ pedal pulses, 2+ radial pulses, Lt thigh mild redness no warmth no pain Neuro:alert and oriented, MAE, follows commands, + facial symmetry Tele:  A fib with RVR HR mostly in 110-120s  though at times still up to 140   Lab Results:  Recent Labs  09/08/14 0720 09/09/14 1350  WBC 21.5* 16.8*  HGB 12.5 12.2  HCT 39.0 38.1  PLT 665* 664*   BMET  Recent Labs  09/08/14 0720 09/09/14 1350  NA 132* 132*  K 4.4 4.1  CL 96 94*  CO2 28 31  GLUCOSE 190* 210*  BUN 13 12  CREATININE 0.65 0.60  CALCIUM 8.5 8.4    Recent Labs  09/08/14 0101 09/08/14 0720  TROPONINI 0.06* <0.03    Lab Results  Component Value Date   CHOL 145 09/09/2014   HDL 29* 09/09/2014   LDLCALC 100* 09/09/2014   TRIG 79 09/09/2014   CHOLHDL 5.0 09/09/2014   Lab Results  Component Value Date   HGBA1C 7.4* 09/07/2014     Lab Results  Component Value Date   TSH 1.334 09/07/2014    Hepatic Function Panel  Recent Labs  09/07/14 2028  PROT 6.5  ALBUMIN 3.4*  AST 19  ALT 16  ALKPHOS 69  BILITOT 1.0    Recent Labs  09/09/14 1350  CHOL 145   No results for input(s): PROTIME in the last 72 hours.     Studies/Results: No results found.  Medications: I have reviewed the patient's current medications. Scheduled Meds: . amiodarone  400 mg Oral Daily  . aspirin EC  81 mg Oral Daily  . cefTRIAXone (ROCEPHIN)  IV  1 g Intravenous Q24H  . folic acid  1 mg Oral Daily  . hydroxyurea  500 mg  Oral Daily  . insulin aspart  0-5 Units Subcutaneous QHS  . insulin aspart  0-9 Units Subcutaneous TID WC  . metoprolol  25 mg Oral BID  . rivaroxaban  20 mg Oral Daily  . sertraline  50 mg Oral Daily  . sodium chloride  3 mL Intravenous Q12H   Continuous Infusions: . sodium chloride 500 mL (09/09/14 0859)   PRN Meds:.acetaminophen, hydrOXYzine, ondansetron (ZOFRAN) IV, sodium chloride, traMADol, traZODone  Assessment/Plan: Atrial fib with RVR - cardioverted yesterday- maintaining sinus, now back on po amiodarone. Xarelto. CHAD2S2VASc score 6  CAD with hx of CABG 2002 Last Nuc 2013, no reversibility no cath that I can find in notes. Mild troponin elevation on admit but now  normal. No plans for catheterization. No chest pain.   HTN- stable  UTI now on rocephin, continue IV antibiotics. Recheck BMET and CBC - Recheck UA tomorrow.  Left arm erythema - ?cellulitis, on rocephin. May need to broaden antibiotics - clindamycin or bactrim, will ask ID for recs.  HLD- start lipitor 20 mg QHS  DM-2 stable, on home insulin and SSI  Anticipate d/c tomorrow 4/7.   LOS: 3 days   Time spent with pt. :15 minutes. [facility]ie [place]  Nurse Practitioner Certified Pager [phone] or after 5pm and on weekends call [phone] 09/10/2014, 10:05 AM

## 2014-09-11 DIAGNOSIS — I809 Phlebitis and thrombophlebitis of unspecified site: Secondary | ICD-10-CM

## 2014-09-11 DIAGNOSIS — M7989 Other specified soft tissue disorders: Secondary | ICD-10-CM

## 2014-09-11 DIAGNOSIS — Z86718 Personal history of other venous thrombosis and embolism: Secondary | ICD-10-CM

## 2014-09-11 LAB — CBC WITH DIFFERENTIAL/PLATELET
Basophils Absolute: 0.1 10*3/uL (ref 0.0–0.1)
Basophils Relative: 1 % (ref 0–1)
Eosinophils Absolute: 0.5 10*3/uL (ref 0.0–0.7)
Eosinophils Relative: 4 % (ref 0–5)
HEMATOCRIT: 36.5 % (ref 36.0–46.0)
HEMOGLOBIN: 11.5 g/dL — AB (ref 12.0–15.0)
Lymphocytes Relative: 14 % (ref 12–46)
Lymphs Abs: 1.9 10*3/uL (ref 0.7–4.0)
MCH: 35.5 pg — ABNORMAL HIGH (ref 26.0–34.0)
MCHC: 31.5 g/dL (ref 30.0–36.0)
MCV: 112.7 fL — ABNORMAL HIGH (ref 78.0–100.0)
MONO ABS: 1.1 10*3/uL — AB (ref 0.1–1.0)
MONOS PCT: 8 % (ref 3–12)
NEUTROS ABS: 9.8 10*3/uL — AB (ref 1.7–7.7)
NEUTROS PCT: 73 % (ref 43–77)
Platelets: 528 10*3/uL — ABNORMAL HIGH (ref 150–400)
RBC: 3.24 MIL/uL — ABNORMAL LOW (ref 3.87–5.11)
RDW: 17.4 % — ABNORMAL HIGH (ref 11.5–15.5)
WBC: 13.4 10*3/uL — ABNORMAL HIGH (ref 4.0–10.5)

## 2014-09-11 LAB — GLUCOSE, CAPILLARY
GLUCOSE-CAPILLARY: 223 mg/dL — AB (ref 70–99)
Glucose-Capillary: 159 mg/dL — ABNORMAL HIGH (ref 70–99)
Glucose-Capillary: 139 mg/dL — ABNORMAL HIGH (ref 70–99)

## 2014-09-11 LAB — BASIC METABOLIC PANEL
Anion gap: 9 (ref 5–15)
BUN: 7 mg/dL (ref 6–23)
CALCIUM: 8.4 mg/dL (ref 8.4–10.5)
CHLORIDE: 98 mmol/L (ref 96–112)
CO2: 29 mmol/L (ref 19–32)
CREATININE: 0.58 mg/dL (ref 0.50–1.10)
GFR calc Af Amer: >90 mL/min (ref >90)
GFR calc non Af Amer: 85 mL/min — ABNORMAL LOW (ref >90)
Glucose, Bld: 133 mg/dL — ABNORMAL HIGH (ref 70–99)
Potassium: 4.1 mmol/L (ref 3.5–5.1)
Sodium: 136 mmol/L (ref 135–145)

## 2014-09-11 MED ORDER — AMIODARONE HCL 200 MG PO TABS: 400.0000 mg | ORAL_TABLET | Freq: Every day | ORAL | Status: DC

## 2014-09-11 MED ORDER — SULFAMETHOXAZOLE-TRIMETHOPRIM 800-160 MG PO TABS: 1.0000 | ORAL_TABLET | Freq: Two times a day (BID) | ORAL | Status: DC

## 2014-09-11 MED ORDER — LISINOPRIL 20 MG PO TABS: 20.0000 mg | ORAL_TABLET | Freq: Every day | ORAL | Status: DC

## 2014-09-11 MED ORDER — METOPROLOL TARTRATE 25 MG PO TABS: 25.0000 mg | ORAL_TABLET | Freq: Two times a day (BID) | ORAL | Status: DC

## 2014-09-11 MED ORDER — RIVAROXABAN 20 MG PO TABS: 20.0000 mg | ORAL_TABLET | Freq: Every day | ORAL | Status: DC

## 2014-09-11 MED ORDER — ATORVASTATIN CALCIUM 20 MG PO TABS: 20.0000 mg | ORAL_TABLET | Freq: Every day | ORAL | Status: DC

## 2014-09-11 MED ORDER — SULFAMETHOXAZOLE-TRIMETHOPRIM 800-160 MG PO TABS
1.0000 | ORAL_TABLET | Freq: Two times a day (BID) | ORAL | Status: DC
  Administered 2014-09-11: 1 via ORAL
  Filled 2014-09-11 (×3): qty 1

## 2014-09-11 NOTE — Discharge Summary (Signed)
CARDIOLOGY DISCHARGE SUMMARY   Patient ID: Theresa Bird MRN: [id number] DOB/AGE: 1933/08/23 81 y.o.  Admit date: 09/07/2014 Discharge date: 09/11/2014  PCP: Celedonio Savage, MD Primary Cardiologist: Dr Domenic Polite  Primary Discharge Diagnosis:  Atrial fibrillation, RVR Secondary Discharge Diagnosis:    DM2 (diabetes mellitus, type 2)   Dyslipidemia   CORONARY ATHEROSCLEROSIS NATIVE CORONARY ARTERY   Essential hypertension   Cellulitis of left upper arm   UTI (urinary tract infection)   Phlebitis   Screen for STD (sexually transmitted disease)   Essential thrombocythemia   Myelofibrosis   Consults: ID  Procedures: DCCV   Hospital Course: Theresa Bird is a 81 y.o. female with a history of atrial fibrillation, on amiodarone and Xarelto. She developed a feeling of weakness for 3-4 days prior to admission as well as left lower extremity swelling and redness. She went to [facility] where she was found to be in atrial fibrillation with rapid ventricular response. She was started on IV diltiazem that was increased to 15 mg/h. She was also given IV metoprolol, but her heart rate was poorly controlled. She was transferred to [facility] for further evaluation and treatment.  She was felt to have a rash on her left arm that was treated with steroids and antihistamine. Upon arrival at [facility] she was continued on over-the-counter antihistamines and when necessary medications. She was felt to have possible phlebitis/cellulitis as well as a UTI, so was started on antibiotics. Her condition improved rapidly.  Her heart rate remained poorly controlled despite the IV Cardizem. She was started on IV amiodarone with the plan to cardiovert her once she had had time to get at least a partial load with the amiodarone.  She had been compliant with Xarelto, no doses missed, so she could be scheduled for cardioversion without a TEE. She was continued on Xarelto while in the hospital.  On  09/09/2014, she was felt stable for cardioversion. This was performed with the assistance of anesthesia. One shock at 120 J, biphasic converted her to sinus rhythm, rate of 70. She tolerated the procedure well.  There was concern for possible cellulitis as well as phlebitis and a UTI. An ID consult was called to help manage her antibiotic therapy. Dopplers were performed that showed a short segment of left basilic vein with thrombus at the basilic fossa correlating with focal swelling and discoloration of soft tissue. Her leg did not have any DVT. This should resolve with continued Xarelto.  On 09/11/2014, she was seen by Dr. Debara Pickett and by Dr. Johnnye Sima. They both approved transitioning from IV to oral antibiotic therapy which was done. She is to complete 10 days of therapy and contact her primary care physician if she does not improve. She was maintaining sinus rhythm and tolerating the increased dose of amiodarone well. Her weakness had improved with restoration of sinus rhythm. No further inpatient workup was indicated and she is considered stable for discharge, to follow-up as an outpatient.  Labs:   Lab Results  Component Value Date   WBC 13.4* 09/11/2014   HGB 11.5* 09/11/2014   HCT 36.5 09/11/2014   MCV 112.7* 09/11/2014   PLT 528* 09/11/2014    Recent Labs Lab 09/07/14 2028  09/11/14 0342  NA 134*  < > 136  K 4.2  < > 4.1  CL 95*  < > 98  CO2 32  < > 29  BUN 10  < > 7  CREATININE 0.67  < > 0.58  CALCIUM 8.3*  < > 8.4  PROT 6.5  --   --   BILITOT 1.0  --   --   ALKPHOS 69  --   --   ALT 16  --   --   AST 19  --   --   GLUCOSE 158*  < > 133*  < > = values in this interval not displayed.   TROPONIN I  Date Value Ref Range Status  09/08/2014 <0.03 <0.031 ng/mL Final  09/08/2014 0.06* <0.031 ng/mL Final  09/07/2014 0.05* <0.031 ng/mL Final   Lipid Panel     Component Value Date/Time   CHOL 145 09/09/2014 1350   TRIG 79 09/09/2014 1350   HDL 29* 09/09/2014 1350   CHOLHDL  5.0 09/09/2014 1350   VLDL 16 09/09/2014 1350   LDLCALC 100* 09/09/2014 1350    B NATRIURETIC PEPTIDE  Date/Time Value Ref Range Status  09/07/2014 08:28 PM 452.5* 0.0 - 100.0 pg/mL Final     EKG: Atrial fib, RVR  FOLLOW UP PLANS AND APPOINTMENTS No Known Allergies   Medication List    STOP taking these medications        diltiazem 120 MG 24 hr capsule  Commonly known as:  CARDIZEM CD      TAKE these medications        amiodarone 200 MG tablet  Commonly known as:  PACERONE  Take 2 tablets (400 mg total) by mouth daily.     atorvastatin 20 MG tablet  Commonly known as:  LIPITOR  Take 1 tablet (20 mg total) by mouth daily at 6 PM.     hydroxyurea 500 MG capsule  Commonly known as:  HYDREA  Take 500 mg by mouth 2 (two) times daily.     hydrOXYzine 25 MG tablet  Commonly known as:  ATARAX/VISTARIL  Take 25 mg by mouth 4 (four) times daily.     insulin NPH Human 100 UNIT/ML injection  Commonly known as:  HUMULIN N,NOVOLIN N  Inject 15-20 Units into the skin 2 (two) times daily before a meal. Inject 15 units before breakfast and 20 units before supper     lisinopril 20 MG tablet  Commonly known as:  PRINIVIL,ZESTRIL  Take 1 tablet (20 mg total) by mouth at bedtime.     metoprolol tartrate 25 MG tablet  Commonly known as:  LOPRESSOR  Take 1 tablet (25 mg total) by mouth 2 (two) times daily.     OXYGEN  Inhale 2 L into the lungs.     rivaroxaban 20 MG Tabs tablet  Commonly known as:  XARELTO  Take 1 tablet (20 mg total) by mouth daily after supper.     sertraline 50 MG tablet  Commonly known as:  ZOLOFT  Take 50 mg by mouth daily.     sulfamethoxazole-trimethoprim 800-160 MG per tablet  Commonly known as:  BACTRIM DS,SEPTRA DS  Take 1 tablet by mouth every 12 (twelve) hours.     traMADol 50 MG tablet  Commonly known as:  ULTRAM  Take 50 mg by mouth every 6 (six) hours as needed (pain).        Discharge Instructions    Diet - low sodium heart healthy     Complete by:  As directed      Diet Carb Modified    Complete by:  As directed      Increase activity slowly    Complete by:  As directed  Follow-up Information    Follow up with Rozann Lesches, MD.   Specialty:  Cardiology   Why:  The office will call.   Contact information:   [place] [address] [phone]       BRING ALL MEDICATIONS WITH YOU TO FOLLOW UP APPOINTMENTS  Time spent with patient to include physician time: 41 min Signed: Rosaria Ferries, PA-C 09/11/2014, 3:19 PM Co-Sign MD

## 2014-09-11 NOTE — Progress Notes (Signed)
VASCULAR LAB PRELIMINARY  PRELIMINARY  PRELIMINARY  PRELIMINARY  LUEV completed.    Preliminary report:  Short segment of LEFT Basilic vein with thrombus at the [facility] fossa correlating with focal swelling and discoloration of soft tissue.  Theresa Bird, RVT 09/11/2014, 10:31 AM

## 2014-09-11 NOTE — Progress Notes (Signed)
Pt d/c'd via wheelchair with family. IV and tele d/c'd. D/c medications and instructions were reviewed and given. All questions answered. Pt verbalized understanding. No c/o pain.

## 2014-09-11 NOTE — Progress Notes (Signed)
INFECTIOUS DISEASE PROGRESS NOTE  ID: Theresa Bird is a 81 y.o. female with  Principal Problem:   Atrial fibrillation with RVR Active Problems:   DM2 (diabetes mellitus, type 2)   Dyslipidemia   CORONARY ATHEROSCLEROSIS NATIVE CORONARY ARTERY   Essential hypertension   Cellulitis of left upper arm   UTI (urinary tract infection)   Phlebitis   Screen for STD (sexually transmitted disease)   Essential thrombocythemia   Myelofibrosis  Subjective: Without complaints  Abtx:  Anti-infectives    Start     Dose/Rate Route Frequency Ordered Stop   09/11/14 1200  sulfamethoxazole-trimethoprim (BACTRIM DS,SEPTRA DS) 800-160 MG per tablet 1 tablet     1 tablet Oral Every 12 hours 09/11/14 1131     09/08/14 0330  cefTRIAXone (ROCEPHIN) 1 g in dextrose 5 % 50 mL IVPB - Premix  Status:  Discontinued     1 g 100 mL/hr over 30 Minutes Intravenous Every 24 hours 09/08/14 0314 09/11/14 1131      Medications:  Scheduled: . amiodarone  400 mg Oral Daily  . aspirin EC  81 mg Oral Daily  . atorvastatin  20 mg Oral q1800  . folic acid  1 mg Oral Daily  . hydroxyurea  500 mg Oral Daily  . insulin aspart  0-5 Units Subcutaneous QHS  . insulin aspart  0-9 Units Subcutaneous TID WC  . metoprolol  25 mg Oral BID  . rivaroxaban  20 mg Oral Daily  . sertraline  50 mg Oral Daily  . sodium chloride  3 mL Intravenous Q12H  . sulfamethoxazole-trimethoprim  1 tablet Oral Q12H    Objective: Vital signs in last 24 hours: Temp:  [98 F (36.7 C)-98.6 F (37 C)] 98.6 F (37 C) (04/07 0730) Pulse Rate:  [62-74] 74 (04/07 0946) Resp:  [18] 18 (04/07 0730) BP: (114-125)/(50-58) 114/57 mmHg (04/07 0946) SpO2:  [93 %-100 %] 98 % (04/07 0730) Weight:  [82.419 kg (181 lb 11.2 oz)] 82.419 kg (181 lb 11.2 oz) (04/07 0400)   General appearance: alert, cooperative and no distress Resp: clear to auscultation bilaterally Cardio: regular rate and rhythm GI: normal findings: bowel sounds normal and  soft, non-tender Extremities: L AC fossa shows mild inflamation. non-tender  Lab Results  Recent Labs  09/10/14 1200 09/11/14 0342  WBC 15.4* 13.4*  HGB 11.6* 11.5*  HCT 36.0 36.5  NA 132* 136  K 4.4 4.1  CL 93* 98  CO2 31 29  BUN 8 7  CREATININE 0.66 0.58   Liver Panel No results for input(s): PROT, ALBUMIN, AST, ALT, ALKPHOS, BILITOT, BILIDIR, IBILI in the last 72 hours. Sedimentation Rate No results for input(s): ESRSEDRATE in the last 72 hours. C-Reactive Protein No results for input(s): CRP in the last 72 hours.  Microbiology: Recent Results (from the past 240 hour(s))  MRSA PCR Screening     Status: None   Collection Time: 09/08/14  4:47 AM  Result Value Ref Range Status   MRSA by PCR NEGATIVE NEGATIVE Final    Comment:        The GeneXpert MRSA Assay (FDA approved for NASAL specimens only), is one component of a comprehensive MRSA colonization surveillance program. It is not intended to diagnose MRSA infection nor to guide or monitor treatment for MRSA infections.     Studies/Results: No results found.   Assessment/Plan: CAD, CABG Cellulitis Superficial phlebitis ? UTI  Total days of antibiotics: 4 ceftriaxone  Would Continue her ceftriaxone for now Consider change  to keflex when ready for d/c.  Aim for 10 days of therapy for both cellulitis and possible UTI.           Bobby Rumpf Infectious Diseases (pager) [phone] [email or web address] 09/11/2014, 12:29 PM  LOS: 4 days

## 2014-09-11 NOTE — Progress Notes (Signed)
81 yo woman with PMH of CAD s/p CABG '02, hypertension, dyslipidemia, paroxysmal atrial fibrillation on xarelto and amiodarone who presents with feeling weak and left leg rash. She was found to be in atrial fibrillation with RVR. She tells me she's felt off/weak for 3-4 days. She also has noted swelling in her left leg and some redness. She denies infectious symptoms or travel. She is compliant with her amiodarone and xarelto. No fever/chills. No nausea/vomiting/diarrhea. At [facility] she was trialled in IV diltiazem up to 15 mg/hr, IV metoprolol 5 mg but her HR stayed between 135-150 leading to transfer to [facility]. Now on IV amiodarone drip, NPO for potential DCCV, on Xarelto. Negative for DVT, elevated WBC most likely due to UTI.   Subjective: Appreciate ID evaluation yesterday. Left AC fossa noted to have superficial phlebitis. No clear infection. Leukocytosis seems to be improving on treatment for UTI with ceftriaxone. Repeat UA seems to be improved after 3 days of antibiotics - now just few bacteria, nitrite negative.  Objective: Vital signs in last 24 hours: Temp:  [97.9 F (36.6 C)-98.6 F (37 C)] 98.6 F (37 C) (04/07 0730) Pulse Rate:  [59-74] 74 (04/07 0946) Resp:  [18] 18 (04/07 0730) BP: (107-125)/(50-58) 114/57 mmHg (04/07 0946) SpO2:  [93 %-100 %] 98 % (04/07 0730) Weight:  [181 lb 11.2 oz (82.419 kg)] 181 lb 11.2 oz (82.419 kg) (04/07 0400) Weight change: -1 lb 6.4 oz (-0.635 kg) Last BM Date: 09/09/14 Intake/Output from previous day: -481   Intake/Output this shift: Total I/O In: 480 [P.O.:480] Out: -   PE: General:Pleasant affect, NAD Skin:Warm and dry, brisk capillary refill HEENT:normocephalic, sclera clear, mucus membranes moist Heart:irreg irreg with soft murmur, gallup, rub or click Lungs:clear though diminished in bases but improved from yesterday, few crackles, no rhonchi, or wheezes VOJ:JKKX, non tender, + BS, do not palpate liver spleen or  masses Ext:no lower ext edema, 2+ pedal pulses, 2+ radial pulses, left elbow with warmth, erythema Neuro:alert and oriented, MAE, follows commands, + facial symmetry  Tele:  Maintaining sinus at 74   Lab Results:  Recent Labs  09/10/14 1200 09/11/14 0342  WBC 15.4* 13.4*  HGB 11.6* 11.5*  HCT 36.0 36.5  PLT 610* 528*   BMET  Recent Labs  09/10/14 1200 09/11/14 0342  NA 132* 136  K 4.4 4.1  CL 93* 98  CO2 31 29  GLUCOSE 195* 133*  BUN 8 7  CREATININE 0.66 0.58  CALCIUM 8.3* 8.4   No results for input(s): TROPONINI in the last 72 hours.  Invalid input(s): CK, MB  Lab Results  Component Value Date   CHOL 145 09/09/2014   HDL 29* 09/09/2014   LDLCALC 100* 09/09/2014   TRIG 79 09/09/2014   CHOLHDL 5.0 09/09/2014   Lab Results  Component Value Date   HGBA1C 7.4* 09/07/2014     Lab Results  Component Value Date   TSH 1.334 09/07/2014    Hepatic Function Panel No results for input(s): PROT, ALBUMIN, AST, ALT, ALKPHOS, BILITOT, BILIDIR, IBILI in the last 72 hours.  Recent Labs  09/09/14 1350  CHOL 145   No results for input(s): PROTIME in the last 72 hours.     Studies/Results: No results found.  Medications: I have reviewed the patient's current medications. Scheduled Meds: . amiodarone  400 mg Oral Daily  . aspirin EC  81 mg Oral Daily  . atorvastatin  20 mg Oral q1800  . cefTRIAXone (ROCEPHIN)  IV  1 g Intravenous Q24H  . folic acid  1 mg Oral Daily  . hydroxyurea  500 mg Oral Daily  . insulin aspart  0-5 Units Subcutaneous QHS  . insulin aspart  0-9 Units Subcutaneous TID WC  . metoprolol  25 mg Oral BID  . rivaroxaban  20 mg Oral Daily  . sertraline  50 mg Oral Daily  . sodium chloride  3 mL Intravenous Q12H   Continuous Infusions: . sodium chloride 500 mL (09/09/14 0859)   PRN Meds:.acetaminophen, hydrOXYzine, ondansetron (ZOFRAN) IV, sodium chloride, traMADol, traZODone  Assessment/Plan: Atrial fib with RVR - cardioverted  maintaining sinus, now back on po amiodarone. Xarelto. CHAD2S2VASc score 6  CAD with hx of CABG 2002 Last Nuc 2013, no reversibility no cath that I can find in notes. Mild troponin elevation on admit but now normal. No plans for catheterization. No chest pain.   HTN- stable  UTI now on rocephin - UA is improved. Leukocytosis is resolving. Will switch to BactrimDS bid for 1 week after discharge.  Left arm erythema - there is superficial phlebitis - appreciate ID recs.  HLD- on +lipitor 20 mg QHS  DM-2 stable, on home insulin and SSI  Ok for d/c home today. Follow-up with Dr. Domenic Polite in [place] and PCP.  Pixie Casino, MD, [facility] Attending Cardiologist [facility]   LOS: 4 days  HILTY,Kenneth C   09/11/2014, 11:02 AM

## 2014-09-12 LAB — URINE CULTURE
COLONY COUNT: NO GROWTH
CULTURE: NO GROWTH
SPECIAL REQUESTS: NORMAL

## 2014-10-02 ENCOUNTER — Encounter: Payer: Self-pay | Admitting: *Deleted

## 2014-10-02 ENCOUNTER — Encounter: Payer: Self-pay | Admitting: Cardiology

## 2014-10-02 ENCOUNTER — Other Ambulatory Visit: Payer: Self-pay | Admitting: Cardiology

## 2014-10-02 ENCOUNTER — Telehealth: Payer: Self-pay | Admitting: Cardiology

## 2014-10-02 ENCOUNTER — Ambulatory Visit (INDEPENDENT_AMBULATORY_CARE_PROVIDER_SITE_OTHER): Payer: Medicare Other | Admitting: Cardiology

## 2014-10-02 VITALS — BP 115/70 | HR 121 | Ht 69.0 in | Wt 166.8 lb

## 2014-10-02 DIAGNOSIS — I4891 Unspecified atrial fibrillation: Secondary | ICD-10-CM | POA: Diagnosis not present

## 2014-10-02 DIAGNOSIS — I4819 Other persistent atrial fibrillation: Secondary | ICD-10-CM

## 2014-10-02 NOTE — Patient Instructions (Addendum)
Your physician recommends that you continue on your current medications as directed. Please refer to the Current Medication list given to you today. Your physician has recommended that you have a Cardioversion (DCCV). Electrical Cardioversion uses a jolt of electricity to your heart either through paddles or wired patches attached to your chest. This is a controlled, usually prescheduled, procedure. Defibrillation is done under light anesthesia in the hospital, and you usually go home the day of the procedure. This is done to get your heart back into a normal rhythm. You are not awake for the procedure. Please see the instruction sheet given to you today. You are being referred to the Atrial Fibrillation Clinic at the [facility] on 10/14/14 @3 :00 pm with Orson Eva NP. Please park in the parking deck for the [facility] and use the code [id number] to get in the parking deck. Go to the first floor to check in for your appointment. Their phone number is [phone]. Your physician recommends that you schedule a follow-up appointment in: 1 month with Dr. Domenic Polite.

## 2014-10-02 NOTE — Progress Notes (Signed)
Cardiology Office Note  Date: 10/02/2014   ID: Theresa Bird, DOB [birth year], MRN [id number]  PCP: Celedonio Savage, MD  Primary Cardiologist: Rozann Lesches, MD   Chief Complaint  Patient presents with  . Hospitalization Follow-up  . Coronary Artery Disease  . Atrial Fibrillation    History of Present Illness: Theresa Bird is a 81 y.o. female last seen in October 2015, was to follow-up in 3 months - this visit did not occur. Records reviewed finding recent hospitalization in early April at [facility] in transfer from [place]. She had recurrent rapid atrial fibrillation documented at that time in the setting of associated left arm phlebitis/cellulitis as well as UTI. She was treated with rate control strategy as well as intravenous amiodarone, ultimately undergoing successful cardioversion on Xarelto. She was continued on antibiotic therapy at discharge with planned follow-up with her primary care provider. She states that she will be seeing Dr. Wenda Overland soon.  She presents today for follow-up visit, is noted to be back in atrial fibrillation with RVR. She does not specifically feel palpitations which makes understanding duration of this arrhythmia difficult. She has felt mildly lightheaded today and indicates that her heart rate has been in the 120s based on checks at home at least over the last 2 days. I suspect she went into atrial fibrillation approximately 48-72 hours ago. She reports compliance with her medications including Xarelto and oral loading dose amiodarone.  I discussed with her options for further treatment, and we have decided to go ahead and arrange an outpatient cardioversion for tomorrow to restore sinus rhythm, and it then get her in for further evaluation in our atrial fibrillation clinic. I suspect that she is going to need alteration in her antiarrhythmic regimen, although is probably not a great candidate for ablation.  She is not reporting any angina symptoms at  this time.   Past Medical History  Diagnosis Date  . Depression   . Essential hypertension, benign   . Hypercholesteremia   . Insulin dependent diabetes mellitus   . 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 Owens Loffler  . Coronary atherosclerosis of native coronary artery     Multivessel status post CABG in [place]  . Atrial fibrillation   . Myelofibrosis     Past Surgical History  Procedure Laterality Date  . Abdominal hysterectomy    . Cataract extraction    . Coronary artery bypass graft      Possibly 2002 in [place]  . Cardioversion N/A 09/09/2014    Procedure: CARDIOVERSION;  Surgeon: Josue Hector, MD;  Location: [facility] ENDOSCOPY;  Service: Cardiovascular;  Laterality: N/A;    Current Outpatient Prescriptions  Medication Sig Dispense Refill  . amiodarone (PACERONE) 200 MG tablet Take 2 tablets (400 mg total) by mouth daily. 60 tablet 3  . atorvastatin (LIPITOR) 20 MG tablet Take 1 tablet (20 mg total) by mouth daily at 6 PM. 30 tablet 11  . hydroxyurea (HYDREA) 500 MG capsule Take 500 mg by mouth 2 (two) times daily.    . hydrOXYzine (ATARAX/VISTARIL) 25 MG tablet Take 25 mg by mouth 4 (four) times daily.    . insulin NPH Human (HUMULIN N,NOVOLIN N) 100 UNIT/ML injection Inject 15-20 Units into the skin 2 (two) times daily before a meal. Inject 15 units before breakfast and 20 units before supper    . lisinopril (PRINIVIL,ZESTRIL) 20 MG tablet Take 1 tablet (20 mg total) by mouth at bedtime. 30 tablet 6  .  metoprolol (LOPRESSOR) 25 MG tablet Take 1 tablet (25 mg total) by mouth 2 (two) times daily. 60 tablet 11  . OXYGEN Inhale 2 L into the lungs.    . rivaroxaban (XARELTO) 20 MG TABS tablet Take 1 tablet (20 mg total) by mouth daily after supper. 30 tablet 6  . sertraline (ZOLOFT) 50 MG tablet Take 50 mg by mouth daily.    Marland Kitchen sulfamethoxazole-trimethoprim (BACTRIM DS,SEPTRA DS) 800-160 MG per tablet Take 1 tablet by mouth every 12 (twelve) hours. 13 tablet 0    . traMADol (ULTRAM) 50 MG tablet Take 50 mg by mouth every 6 (six) hours as needed (pain).      No current facility-administered medications for this visit.    Allergies:  Review of patient's allergies indicates no known allergies.   Social History: The patient  reports that she has never smoked. She has never used smokeless tobacco. She reports that she drinks alcohol. She reports that she does not use illicit drugs.    ROS:  Please see the history of present illness. Otherwise, complete review of systems is positive for mild lightheadedness recently, no falls. No reported bleeding problems..  All other systems are reviewed and negative.   Physical Exam: VS:  BP 115/70 mmHg  Pulse 121  Ht 5\' 9"  (1.753 m)  Wt 166 lb 12.8 oz (75.66 kg)  BMI 24.62 kg/m2  SpO2 98%, BMI Body mass index is 24.62 kg/(m^2).  Wt Readings from Last 3 Encounters:  10/02/14 166 lb 12.8 oz (75.66 kg)  09/11/14 181 lb 11.2 oz (82.419 kg)  03/21/14 162 lb 4 oz (73.596 kg)     Appears comfortable at rest. Wearing oxygen via nasal cannula. HEENT: Conjunctiva and lids normal, oropharynx clear.  Neck: Supple, no elevated JVP or carotid bruits, no thyromegaly.  Lungs: Diminished breath sounds, clear, nonlabored breathing at rest.  Cardiac: Irregularly irregular and rapid, no S3 or significant systolic murmur, no pericardial rub.  Abdomen: Soft, nontender, bowel sounds present.  Extremities: No pitting edema, distal pulses 1-2+.  Skin: Warm and dry.  Musculoskeletal: No kyphosis.  Neuropsychiatric: Alert and oriented x3, affect grossly appropriate.   ECG: ECG is ordered today and shows atrial fibrillation at 109 bpm.   Recent Labwork: 09/07/2014: ALT 16; AST 19; B Natriuretic Peptide 452.5*; TSH 1.334 09/11/2014: BUN 7; Creatinine 0.58; Hemoglobin 11.5*; Platelets 528*; Potassium 4.1; Sodium 136     Component Value Date/Time   CHOL 145 09/09/2014 1350   TRIG 79 09/09/2014 1350   HDL 29* 09/09/2014  1350   CHOLHDL 5.0 09/09/2014 1350   VLDL 16 09/09/2014 1350   LDLCALC 100* 09/09/2014 1350    Other Studies Reviewed Today:  Echocardiogram from September 2015 reported mild LVH with LVEF 60-65%, mild left atrial enlargement, moderately sclerotic aortic valve, mild mitral regurgitation, RVSP 36 mm mercury.  Assessment and Plan:  1. Recurrent paroxysmal atrial fibrillation with rapid ventricular response. She had recent cardioversion at [facility] in early April, is back in atrial fibrillation likely within the last 48 to 72 hours. She reports compliance with Xarelto, beta blocker, and oral amiodarone load. After discussion our plan is to arrange a follow-up outpatient cardioversion tomorrow to restore sinus rhythm, and then get her evaluated in our atrial fibrillation clinic for further adjustments in antiarrhythmics and discussion of treatment options.  2. CAD status post CABG, no active angina symptoms. Recent echocardiogram showed LVEF 60-65% range.  3. Essential hypertension, blood pressure is normal today. I note that she has  had some problems with advancing rate control medications due to symptomatic hypotension. I am not changing her medications today.  Current medicines were reviewed with the patient today.   Orders Placed This Encounter  Procedures  . Cardioversion (Bedside)  . EKG 12-Lead    Disposition: FU with me in 1 month.   Signed, Satira Sark, MD, [facility] 10/02/2014 3:09 PM    [facility] [place], [place], [place] [address] Phone: [phone]; Fax: [phone]

## 2014-10-02 NOTE — Telephone Encounter (Signed)
Pt has Medicare and BCBS.  No precert required for DCCV

## 2014-10-02 NOTE — Telephone Encounter (Signed)
DCCV 10/03/14 @10 :00 am @[facility]  with Dr. Harl Bowie dx: atrial fibrillation Checking percert

## 2014-10-03 ENCOUNTER — Ambulatory Visit (HOSPITAL_COMMUNITY): Payer: Medicare Other | Admitting: Anesthesiology

## 2014-10-03 ENCOUNTER — Encounter (HOSPITAL_COMMUNITY): Payer: Self-pay | Admitting: Cardiology

## 2014-10-03 ENCOUNTER — Encounter (HOSPITAL_COMMUNITY)
Admission: RE | Disposition: A | Discharge status: Home / Self Care | Payer: Self-pay | Source: Ambulatory Visit | Attending: Cardiology

## 2014-10-03 ENCOUNTER — Ambulatory Visit (HOSPITAL_COMMUNITY)
Admission: RE | Admit: 2014-10-03 | Discharge: 2014-10-03 | Disposition: A | Discharge status: Home / Self Care | Payer: Medicare Other | Source: Ambulatory Visit | Attending: Cardiology | Admitting: Cardiology

## 2014-10-03 DIAGNOSIS — Z794 Long term (current) use of insulin: Secondary | ICD-10-CM | POA: Diagnosis not present

## 2014-10-03 DIAGNOSIS — I2510 Atherosclerotic heart disease of native coronary artery without angina pectoris: Secondary | ICD-10-CM | POA: Insufficient documentation

## 2014-10-03 DIAGNOSIS — E780 Pure hypercholesterolemia: Secondary | ICD-10-CM | POA: Diagnosis not present

## 2014-10-03 DIAGNOSIS — Z951 Presence of aortocoronary bypass graft: Secondary | ICD-10-CM | POA: Insufficient documentation

## 2014-10-03 DIAGNOSIS — D473 Essential (hemorrhagic) thrombocythemia: Secondary | ICD-10-CM | POA: Diagnosis not present

## 2014-10-03 DIAGNOSIS — D7581 Myelofibrosis: Secondary | ICD-10-CM | POA: Diagnosis not present

## 2014-10-03 DIAGNOSIS — I10 Essential (primary) hypertension: Secondary | ICD-10-CM | POA: Diagnosis not present

## 2014-10-03 DIAGNOSIS — I4819 Other persistent atrial fibrillation: Secondary | ICD-10-CM

## 2014-10-03 DIAGNOSIS — I480 Paroxysmal atrial fibrillation: Secondary | ICD-10-CM | POA: Insufficient documentation

## 2014-10-03 DIAGNOSIS — F329 Major depressive disorder, single episode, unspecified: Secondary | ICD-10-CM | POA: Diagnosis not present

## 2014-10-03 DIAGNOSIS — E119 Type 2 diabetes mellitus without complications: Secondary | ICD-10-CM | POA: Diagnosis not present

## 2014-10-03 HISTORY — PX: CARDIOVERSION: SHX1299

## 2014-10-03 LAB — GLUCOSE, CAPILLARY: Glucose-Capillary: 102 mg/dL — ABNORMAL HIGH (ref 70–99)

## 2014-10-03 LAB — CBC WITH DIFFERENTIAL/PLATELET
Basophils Absolute: 0.3 10*3/uL — ABNORMAL HIGH (ref 0.0–0.1)
Basophils Relative: 3 % — ABNORMAL HIGH (ref 0–1)
Eosinophils Absolute: 0.6 10*3/uL (ref 0.0–0.7)
Eosinophils Relative: 6 % — ABNORMAL HIGH (ref 0–5)
HCT: 40.6 % (ref 36.0–46.0)
Hemoglobin: 13.1 g/dL (ref 12.0–15.0)
Lymphocytes Relative: 14 % (ref 12–46)
Lymphs Abs: 1.4 10*3/uL (ref 0.7–4.0)
MCH: 35.6 pg — AB (ref 26.0–34.0)
MCHC: 32.3 g/dL (ref 30.0–36.0)
MCV: 110.3 fL — ABNORMAL HIGH (ref 78.0–100.0)
MONO ABS: 0.8 10*3/uL (ref 0.1–1.0)
MONOS PCT: 8 % (ref 3–12)
Neutro Abs: 6.8 10*3/uL (ref 1.7–7.7)
Neutrophils Relative %: 69 % (ref 43–77)
PLATELETS: 629 10*3/uL — AB (ref 150–400)
RBC: 3.68 MIL/uL — ABNORMAL LOW (ref 3.87–5.11)
RDW: 15.3 % (ref 11.5–15.5)
WBC: 9.9 10*3/uL (ref 4.0–10.5)

## 2014-10-03 LAB — BASIC METABOLIC PANEL
Anion gap: 8 (ref 5–15)
BUN: 11 mg/dL (ref 6–23)
CO2: 31 mmol/L (ref 19–32)
CREATININE: 0.79 mg/dL (ref 0.50–1.10)
Calcium: 9.1 mg/dL (ref 8.4–10.5)
Chloride: 95 mmol/L — ABNORMAL LOW (ref 96–112)
GFR calc Af Amer: 89 mL/min — ABNORMAL LOW (ref >90)
GFR calc non Af Amer: 77 mL/min — ABNORMAL LOW (ref >90)
GLUCOSE: 100 mg/dL — AB (ref 70–99)
Potassium: 4.9 mmol/L (ref 3.5–5.1)
Sodium: 134 mmol/L — ABNORMAL LOW (ref 135–145)

## 2014-10-03 SURGERY — CARDIOVERSION
Anesthesia: Monitor Anesthesia Care

## 2014-10-03 MED ORDER — PHENYLEPHRINE 40 MCG/ML (10ML) SYRINGE FOR IV PUSH (FOR BLOOD PRESSURE SUPPORT)
PREFILLED_SYRINGE | INTRAVENOUS | Status: AC
  Filled 2014-10-03: qty 10

## 2014-10-03 MED ORDER — FENTANYL CITRATE (PF) 100 MCG/2ML IJ SOLN: 25.0000 ug | INTRAMUSCULAR | Status: DC | PRN

## 2014-10-03 MED ORDER — PHENYLEPHRINE HCL 10 MG/ML IJ SOLN
INTRAMUSCULAR | Status: DC | PRN
  Administered 2014-10-03 (×3): 40 ug via INTRAVENOUS

## 2014-10-03 MED ORDER — PROPOFOL 10 MG/ML IV BOLUS
INTRAVENOUS | Status: AC
  Filled 2014-10-03: qty 20

## 2014-10-03 MED ORDER — HYDROCORTISONE 1 % EX CREA: 1.0000 "application " | TOPICAL_CREAM | Freq: Two times a day (BID) | CUTANEOUS | Status: DC

## 2014-10-03 MED ORDER — MIDAZOLAM HCL 2 MG/2ML IJ SOLN
INTRAMUSCULAR | Status: AC
  Filled 2014-10-03: qty 2

## 2014-10-03 MED ORDER — LIDOCAINE VISCOUS 2 % MT SOLN
OROMUCOSAL | Status: DC | PRN
  Administered 2014-10-03: 30 mL via OROMUCOSAL

## 2014-10-03 MED ORDER — FENTANYL CITRATE (PF) 100 MCG/2ML IJ SOLN
INTRAMUSCULAR | Status: AC
  Filled 2014-10-03: qty 2

## 2014-10-03 MED ORDER — PROPOFOL 10 MG/ML IV BOLUS
INTRAVENOUS | Status: DC | PRN
  Administered 2014-10-03 (×2): 7.5 mg via INTRAVENOUS

## 2014-10-03 MED ORDER — FENTANYL CITRATE (PF) 100 MCG/2ML IJ SOLN
25.0000 ug | Freq: Once | INTRAMUSCULAR | Status: AC
  Administered 2014-10-03: 25 ug via INTRAVENOUS

## 2014-10-03 MED ORDER — ONDANSETRON HCL 4 MG/2ML IJ SOLN: 4.0000 mg | Freq: Once | INTRAMUSCULAR | Status: DC | PRN

## 2014-10-03 MED ORDER — LIDOCAINE HCL (PF) 1 % IJ SOLN
INTRAMUSCULAR | Status: AC
  Filled 2014-10-03: qty 5

## 2014-10-03 MED ORDER — LACTATED RINGERS IV SOLN
INTRAVENOUS | Status: DC
  Administered 2014-10-03: 1000 mL via INTRAVENOUS

## 2014-10-03 MED ORDER — PROPOFOL INFUSION 10 MG/ML OPTIME
INTRAVENOUS | Status: DC | PRN
  Administered 2014-10-03: 100 ug/kg/min via INTRAVENOUS

## 2014-10-03 NOTE — Addendum Note (Signed)
Addendum  created 10/03/14 1111 by Vista Deck, CRNA   Modules edited: Charges VN

## 2014-10-03 NOTE — Transfer of Care (Signed)
Immediate Anesthesia Transfer of Care Note  Patient: Theresa Bird  Procedure(s) Performed: Procedure(s): CARDIOVERSION (N/A)  Patient Location: PACU  Anesthesia Type:MAC  Level of Consciousness: awake, alert  and patient cooperative  Airway & Oxygen Therapy: Patient Spontanous Breathing and Patient connected to nasal cannula oxygen  Post-op Assessment: Report given to RN, Post -op Vital signs reviewed and stable and Patient moving all extremities  Post vital signs: Reviewed and stable  Last Vitals:  Filed Vitals:   10/03/14 0932  BP: 100/66  Temp: 36.2 C  Resp: 22    Complications: No apparent anesthesia complications

## 2014-10-03 NOTE — Anesthesia Postprocedure Evaluation (Signed)
Anesthesia Post Note  Patient: Theresa Bird  Procedure(s) Performed: Procedure(s) (LRB): CARDIOVERSION (N/A)  Anesthesia type: MAC  Patient location: PACU  Post pain: Pain level controlled  Post assessment: Post-op Vital signs reviewed, Patient's Cardiovascular Status Stable, Respiratory Function Stable, Patent Airway, No signs of Nausea or vomiting and Pain level controlled  Last Vitals:  Filed Vitals:   10/03/14 0932  BP: 100/66  Temp: 36.2 C  Resp: 22    Post vital signs: Reviewed and stable  Level of consciousness: awake and alert   Complications: No apparent anesthesia complications

## 2014-10-03 NOTE — Progress Notes (Addendum)
Electrical Cardioversion Procedure Note MARLIYAH REID [id number] 12/18/1933  Procedure: Electrical Cardioversion Indications:  Atrial Fibrillation  Procedure Details Consent: Risks of procedure as well as the alternatives and risks of each were explained to the (patient/caregiver).  Consent for procedure obtained. Time Out: Verified patient identification, verified procedure, site/side was marked, verified correct patient position, special equipment/implants available, medications/allergies/relevent history reviewed, required imaging and test results available.  Performed with participation of Dr. Harl Bowie, K. Laurance Flatten, RN, T. Lorin Mercy, CRNA, Dr. Patsey Berthold, and myself  Patient placed on cardiac monitor, pulse oximetry, supplemental oxygen as necessary.  Sedation given: propofol given by T. Yates, CRNA Pacer pads placed anterior and posterior chest.  Cardioverted 1 time(s).  Cardioverted at [place].  Evaluation Findings: Post procedure EKG shows: sinus bradycardia Complications: none Patient did tolerate procedure well.   12 lead EKG performed to confirm sinus bradycardia. Dr. Harl Bowie talked with family.  Charm Barges S 10/03/2014, 10:29 AM

## 2014-10-03 NOTE — Anesthesia Preprocedure Evaluation (Signed)
Anesthesia Evaluation  Patient identified by MRN, date of birth, ID band Patient awake    Reviewed: Allergy & Precautions, NPO status , Patient's Chart, lab work & pertinent test results, reviewed documented beta blocker date and time   Airway Mallampati: II  TM Distance: >3 FB Neck ROM: Full    Dental  (+) Missing, Dental Advisory Given, Poor Dentition, Chipped   Pulmonary neg pulmonary ROS, COPD breath sounds clear to auscultation        Cardiovascular hypertension, Pt. on medications + CAD + dysrhythmias Atrial Fibrillation Rhythm:Irregular Rate:Tachycardia  2015 ECHO 55%   Neuro/Psych PSYCHIATRIC DISORDERS Depression negative neurological ROS     GI/Hepatic negative GI ROS, Neg liver ROS,   Endo/Other  diabetes, Type 2, Insulin Dependent  Renal/GU      Musculoskeletal   Abdominal (+)  Abdomen: soft.    Peds  Hematology   Anesthesia Other Findings   Reproductive/Obstetrics                             Anesthesia Physical Anesthesia Plan  ASA: III  Anesthesia Plan: MAC   Post-op Pain Management:    Induction: Intravenous  Airway Management Planned: Simple Face Mask  Additional Equipment:   Intra-op Plan:   Post-operative Plan:   Informed Consent: I have reviewed the patients History and Physical, chart, labs and discussed the procedure including the risks, benefits and alternatives for the proposed anesthesia with the patient or authorized representative who has indicated his/her understanding and acceptance.     Plan Discussed with:   Anesthesia Plan Comments:         Anesthesia Quick Evaluation

## 2014-10-03 NOTE — Discharge Instructions (Signed)

## 2014-10-03 NOTE — Progress Notes (Signed)
Dr Harl Bowie notified of platelets 629. No new orders given. Okay to proceed with procedure.

## 2014-10-03 NOTE — Progress Notes (Signed)
Patient remains in sinus bradycardia, no complaints, pad sites clear no redness noted. Tolerated well, awake and alert.

## 2014-10-03 NOTE — Anesthesia Procedure Notes (Signed)
Procedure Name: MAC Date/Time: 10/03/2014 10:18 AM Performed by: Vista Deck Pre-anesthesia Checklist: Patient identified, Emergency Drugs available, Suction available, Timeout performed and Patient being monitored Patient Re-evaluated:Patient Re-evaluated prior to inductionOxygen Delivery Method: Non-rebreather mask

## 2014-10-03 NOTE — H&P (Signed)
Patient referred by Dr Domenic Polite for [place], please refer to his clinic note indicated below for full history. History of recurrent afib with prior DCCV in April. Currently on xarelto, metoprolol, and amiodarone. Has been compliant with xarelto >3 weeks, will plan for DCCV today.    Theresa Abts MD  Cardiology Office Note  Date: 10/02/2014   ID: Theresa Bird, Theresa Bird 12/22/1933, MRN [id number]  PCP: Celedonio Savage, MD Primary Cardiologist: Rozann Lesches, MD   Chief Complaint  Patient presents with  . Hospitalization Follow-up  . Coronary Artery Disease  . Atrial Fibrillation    History of Present Illness: Theresa Bird is a 81 y.o. female last seen in October 2015, was to follow-up in 3 months - this visit did not occur. Records reviewed finding recent hospitalization in early April at [facility] in transfer from [place]. She had recurrent rapid atrial fibrillation documented at that time in the setting of associated left arm phlebitis/cellulitis as well as UTI. She was treated with rate control strategy as well as intravenous amiodarone, ultimately undergoing successful cardioversion on Xarelto. She was continued on antibiotic therapy at discharge with planned follow-up with her primary care provider. She states that she will be seeing Dr. Wenda Overland soon.  She presents today for follow-up visit, is noted to be back in atrial fibrillation with RVR. She does not specifically feel palpitations which makes understanding duration of this arrhythmia difficult. She has felt mildly lightheaded today and indicates that her heart rate has been in the 120s based on checks at home at least over the last 2 days. I suspect she went into atrial fibrillation approximately 48-72 hours ago. She reports compliance with her medications including Xarelto and oral loading dose amiodarone.  I discussed with her options for further treatment, and we have decided to go ahead and arrange an outpatient  cardioversion for tomorrow to restore sinus rhythm, and it then get her in for further evaluation in our atrial fibrillation clinic. I suspect that she is going to need alteration in her antiarrhythmic regimen, although is probably not a great candidate for ablation.  She is not reporting any angina symptoms at this time.   Past Medical History  Diagnosis Date  . Depression   . Essential hypertension, benign   . Hypercholesteremia   . Insulin dependent diabetes mellitus   . 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 Owens Loffler  . Coronary atherosclerosis of native coronary artery     Multivessel status post CABG in [place]  . Atrial fibrillation   . Myelofibrosis     Past Surgical History  Procedure Laterality Date  . Abdominal hysterectomy    . Cataract extraction    . Coronary artery bypass graft      Possibly 2002 in [place]  . Cardioversion N/A 09/09/2014    Procedure: CARDIOVERSION; Surgeon: Josue Hector, MD; Location[facility]; Service: Cardiovascular; Laterality: N/A;    Current Outpatient Prescriptions  Medication Sig Dispense Refill  . amiodarone (PACERONE) 200 MG tablet Take 2 tablets (400 mg total) by mouth daily. 60 tablet 3  . atorvastatin (LIPITOR) 20 MG tablet Take 1 tablet (20 mg total) by mouth daily at 6 PM. 30 tablet 11  . hydroxyurea (HYDREA) 500 MG capsule Take 500 mg by mouth 2 (two) times daily.    . hydrOXYzine (ATARAX/VISTARIL) 25 MG tablet Take 25 mg by mouth 4 (four) times daily.    . insulin NPH Human (HUMULIN N,NOVOLIN N) 100 UNIT/ML injection  Inject 15-20 Units into the skin 2 (two) times daily before a meal. Inject 15 units before breakfast and 20 units before supper    . lisinopril (PRINIVIL,ZESTRIL) 20 MG tablet Take 1 tablet (20 mg total) by mouth at bedtime. 30 tablet 6  . metoprolol (LOPRESSOR) 25 MG tablet Take 1 tablet  (25 mg total) by mouth 2 (two) times daily. 60 tablet 11  . OXYGEN Inhale 2 L into the lungs.    . rivaroxaban (XARELTO) 20 MG TABS tablet Take 1 tablet (20 mg total) by mouth daily after supper. 30 tablet 6  . sertraline (ZOLOFT) 50 MG tablet Take 50 mg by mouth daily.    Marland Kitchen sulfamethoxazole-trimethoprim (BACTRIM DS,SEPTRA DS) 800-160 MG per tablet Take 1 tablet by mouth every 12 (twelve) hours. 13 tablet 0  . traMADol (ULTRAM) 50 MG tablet Take 50 mg by mouth every 6 (six) hours as needed (pain).      No current facility-administered medications for this visit.    Allergies: Review of patient's allergies indicates no known allergies.   Social History: The patient  reports that she has never smoked. She has never used smokeless tobacco. She reports that she drinks alcohol. She reports that she does not use illicit drugs.    ROS: Please see the history of present illness. Otherwise, complete review of systems is positive for mild lightheadedness recently, no falls. No reported bleeding problems.. All other systems are reviewed and negative.   Physical Exam: VS: BP 115/70 mmHg  Pulse 121  Ht 5\' 9"  (1.753 m)  Wt 166 lb 12.8 oz (75.66 kg)  BMI 24.62 kg/m2  SpO2 98%, BMI Body mass index is 24.62 kg/(m^2).  Wt Readings from Last 3 Encounters:  10/02/14 166 lb 12.8 oz (75.66 kg)  09/11/14 181 lb 11.2 oz (82.419 kg)  03/21/14 162 lb 4 oz (73.596 kg)     Appears comfortable at rest. Wearing oxygen via nasal cannula. HEENT: Conjunctiva and lids normal, oropharynx clear.  Neck: Supple, no elevated JVP or carotid bruits, no thyromegaly.  Lungs: Diminished breath sounds, clear, nonlabored breathing at rest.  Cardiac: Irregularly irregular and rapid, no S3 or significant systolic murmur, no pericardial rub.  Abdomen: Soft, nontender, bowel sounds present.  Extremities: No pitting edema, distal pulses 1-2+.  Skin: Warm and dry.   Musculoskeletal: No kyphosis.  Neuropsychiatric: Alert and oriented x3, affect grossly appropriate.   ECG: ECG is ordered today and shows atrial fibrillation at 109 bpm.   Recent Labwork: 09/07/2014: ALT 16; AST 19; B Natriuretic Peptide 452.5*; TSH 1.334 09/11/2014: BUN 7; Creatinine 0.58; Hemoglobin 11.5*; Platelets 528*; Potassium 4.1; Sodium 136   Labs (Brief)       Component Value Date/Time   CHOL 145 09/09/2014 1350   TRIG 79 09/09/2014 1350   HDL 29* 09/09/2014 1350   CHOLHDL 5.0 09/09/2014 1350   VLDL 16 09/09/2014 1350   LDLCALC 100* 09/09/2014 1350      Other Studies Reviewed Today:  Echocardiogram from September 2015 reported mild LVH with LVEF 60-65%, mild left atrial enlargement, moderately sclerotic aortic valve, mild mitral regurgitation, RVSP 36 mm mercury.  Assessment and Plan:  1. Recurrent paroxysmal atrial fibrillation with rapid ventricular response. She had recent cardioversion at [facility] in early April, is back in atrial fibrillation likely within the last 48 to 72 hours. She reports compliance with Xarelto, beta blocker, and oral amiodarone load. After discussion our plan is to arrange a follow-up outpatient cardioversion tomorrow to restore sinus rhythm,  and then get her evaluated in our atrial fibrillation clinic for further adjustments in antiarrhythmics and discussion of treatment options.  2. CAD status post CABG, no active angina symptoms. Recent echocardiogram showed LVEF 60-65% range.  3. Essential hypertension, blood pressure is normal today. I note that she has had some problems with advancing rate control medications due to symptomatic hypotension. I am not changing her medications today.  Current medicines were reviewed with the patient today.  Orders Placed This Encounter  Procedures  . Cardioversion (Bedside)  . EKG 12-Lead    Disposition: FU with me in 1 month.   Signed, Satira Sark, MD,  [facility] 10/02/2014 3:09 PM

## 2014-10-03 NOTE — Procedures (Signed)
Procedure: DCCV Physician: Dr Carlyle Dolly MD Indication: recurrent afib  Patient was brought to the procedure suite once consent was obtained. Sedation was achieved with the assistance of anethesiology, please refer to there note for details. Afib with RVR was confirmed with telemetry strip. She was succesfully converted to sinus bradycardia rate of 55 with 1 synchronized 200 J shock. She tolerated the procedure well. Post conversion she did have some hypotension with SBPs in the mid 60s, this improved with IVF and neosynephrine injection given by anethesia, for dosing information refer to there note   [name] MD

## 2014-10-14 ENCOUNTER — Ambulatory Visit (HOSPITAL_COMMUNITY)
Admission: RE | Admit: 2014-10-14 | Discharge: 2014-10-14 | Disposition: A | Discharge status: Home / Self Care | Payer: Medicare Other | Source: Ambulatory Visit | Attending: Nurse Practitioner | Admitting: Nurse Practitioner

## 2014-10-14 ENCOUNTER — Encounter (HOSPITAL_COMMUNITY): Payer: Self-pay | Admitting: Nurse Practitioner

## 2014-10-14 VITALS — BP 120/60 | HR 78 | Ht 69.0 in | Wt 167.4 lb

## 2014-10-14 DIAGNOSIS — E119 Type 2 diabetes mellitus without complications: Secondary | ICD-10-CM | POA: Insufficient documentation

## 2014-10-14 DIAGNOSIS — I10 Essential (primary) hypertension: Secondary | ICD-10-CM | POA: Diagnosis not present

## 2014-10-14 DIAGNOSIS — I4891 Unspecified atrial fibrillation: Secondary | ICD-10-CM

## 2014-10-14 DIAGNOSIS — Z794 Long term (current) use of insulin: Secondary | ICD-10-CM | POA: Diagnosis not present

## 2014-10-14 DIAGNOSIS — I480 Paroxysmal atrial fibrillation: Secondary | ICD-10-CM | POA: Diagnosis present

## 2014-10-14 LAB — HEPATIC FUNCTION PANEL
ALK PHOS: 66 U/L (ref 38–126)
ALT: 15 U/L (ref 14–54)
AST: 22 U/L (ref 15–41)
Albumin: 3.8 g/dL (ref 3.5–5.0)
BILIRUBIN INDIRECT: 0.5 mg/dL (ref 0.3–0.9)
BILIRUBIN TOTAL: 0.7 mg/dL (ref 0.3–1.2)
Bilirubin, Direct: 0.2 mg/dL (ref 0.1–0.5)
TOTAL PROTEIN: 7.1 g/dL (ref 6.5–8.1)

## 2014-10-14 LAB — TSH: TSH: 1.906 u[IU]/mL (ref 0.350–4.500)

## 2014-10-14 MED ORDER — AMIODARONE HCL 200 MG PO TABS: 200.0000 mg | ORAL_TABLET | Freq: Every day | ORAL | Status: DC

## 2014-10-14 NOTE — Progress Notes (Signed)
Patient ID: Theresa Bird, female   DOB: [birth year], 81 y.o.   MRN: [id number]  Primary Care Physician: Celedonio Savage, MD Referring Physician: Dr. Jaclynn Major is a 81 y.o. female with a h/o of PAF,CAD, s/p bypass in 2002, for evaluation in the afib clinic. Pt thinks first occurred  2014 when she presented with the flu, she believes she was cardioverted at the time.She was admitted 09/07/14 at [facility], initially treated at [facility], for cellulitis and UTI, and found to be in afib with RVR. Was transferred to [facility] for poor response to Cardizem drip.She was successfully cardioverted and started on amiodarone IV then transitioned to 200 mg bid. She then presented for OP f/u and was found to be in afib again and was set up for DCCV which was successful, 4/29.Her symptoms when in afib are fatigue and dyspnea, no sensation of palpitations. Has COPD and is wearing O2 via nasal cannula.Has been on xarelto x one year without bleeding issues.Chadsvasc score is at least 4. Denies smoking, alcohol use.  Today, she denies symptoms of palpitations, chest pain, shortness of breath, orthopnea, PND, lower extremity edema, dizziness, presyncope, syncope, or neurologic sequela. The patient is tolerating medications without difficulties and is otherwise without complaint today.   Past Medical History  Diagnosis Date  . Depression   . Essential hypertension, benign   . Hypercholesteremia   . Insulin dependent diabetes mellitus   . 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 Owens Loffler  . Coronary atherosclerosis of native coronary artery     Multivessel status post CABG in [place]  . Atrial fibrillation   . Myelofibrosis    Past Surgical History  Procedure Laterality Date  . Abdominal hysterectomy    . Cataract extraction    . Coronary artery bypass graft      Possibly 2002 in [place]  . Cardioversion N/A 09/09/2014    Procedure: CARDIOVERSION;  Surgeon: Josue Hector, MD;  Location: [facility] ENDOSCOPY;  Service: Cardiovascular;  Laterality: N/A;  . Cardioversion N/A 10/03/2014    Procedure: CARDIOVERSION;  Surgeon: Arnoldo Lenis, MD;  Location: [facility] ORS;  Service: Endoscopy;  Laterality: N/A;    Current Outpatient Prescriptions  Medication Sig Dispense Refill  . amiodarone (PACERONE) 200 MG tablet Take 2 tablets (400 mg total) by mouth daily. 60 tablet 3  . atorvastatin (LIPITOR) 20 MG tablet Take 1 tablet (20 mg total) by mouth daily at 6 PM. 30 tablet 11  . hydroxyurea (HYDREA) 500 MG capsule Take 500 mg by mouth 2 (two) times daily.    . hydrOXYzine (ATARAX/VISTARIL) 25 MG tablet Take 25 mg by mouth 4 (four) times daily.    . insulin NPH Human (HUMULIN N,NOVOLIN N) 100 UNIT/ML injection Inject 15-20 Units into the skin 2 (two) times daily before a meal. Inject 15 units before breakfast and 20 units before supper    . lisinopril (PRINIVIL,ZESTRIL) 20 MG tablet Take 1 tablet (20 mg total) by mouth at bedtime. 30 tablet 6  . metoprolol (LOPRESSOR) 25 MG tablet Take 1 tablet (25 mg total) by mouth 2 (two) times daily. 60 tablet 11  . OXYGEN Inhale 2 L into the lungs.    . rivaroxaban (XARELTO) 20 MG TABS tablet Take 1 tablet (20 mg total) by mouth daily after supper. 30 tablet 6  . sertraline (ZOLOFT) 50 MG tablet Take 50 mg by mouth daily.    Marland Kitchen sulfamethoxazole-trimethoprim (BACTRIM DS,SEPTRA  DS) 800-160 MG per tablet Take 1 tablet by mouth every 12 (twelve) hours. 13 tablet 0  . traMADol (ULTRAM) 50 MG tablet Take 50 mg by mouth every 6 (six) hours as needed (pain).      No current facility-administered medications for this encounter.    No Known Allergies  History   Social History  . Marital Status: Single    Spouse Name: N/A  . Number of Children: N/A  . Years of Education: N/A   Occupational History  . Retired     Environmental manager   Social History Main Topics  . Smoking status: Never Smoker   . Smokeless tobacco: Never Used  . Alcohol Use:  0.0 oz/week    0 Standard drinks or equivalent per week     Comment: Socially  . Drug Use: No  . Sexual Activity: Not on file   Other Topics Concern  . Not on file   Social History Narrative    Family History  Problem Relation Age of Onset  . Coronary artery disease      ROS- All systems are reviewed and negative except as per the HPI above  Physical Exam: Filed Vitals:   10/14/14 1533  BP: 120/60  Pulse: 78  Height: 5\' 9"  (1.753 m)  Weight: 167 lb 6.4 oz (75.932 kg)    GEN- The patient is chronically ill appearing, alert and oriented x 3 today.   Head- normocephalic, atraumatic Eyes-  Sclera clear, conjunctiva pink Ears- hearing intact Oropharynx- clear Neck- supple, no JVP Lymph- no cervical lymphadenopathy Lungs- Clear to ausculation bilaterally, normal work of breathing Heart- Regular rate and rhythm, no murmurs, rubs or gallops, PMI not laterally displaced GI- soft, NT, ND, + BS Extremities- no clubbing, cyanosis, or edema MS- no significant deformity or atrophy Skin- no rash or lesion Psych- euthymic mood, full affect Neuro- strength and sensation are intact  EKG-SR at 78 bpm QTc 435 ms. [place] 02/27/14- LV function i s normal, mild LVH, wall motion are within normal limits. EF 60-65%, Left atrium is mildly dilated.  Assessment and Plan:  1. PAF Finish current RX of amiodarone of 200 mg bid then go to 200 mg qd to help offset long term side effects of drug. Pt is already having PFT's done yearly and is due one soon thru PCP. Reminded will need yearly eye exams. TSH, liver today. Currently, do not think she is an optimal ablation candidate and amiodarone is one of the most effective AAD's, so do not recommend any change in therapy. Continue xarelto for chadsvasc of at least 4.   2. HTN  Stable , no change needed.  3. DM Encouraged to obtain good control of DM to help reduce afib burden.  F/u with Dr. Domenic Polite 6/1 as scheduled and in afib clinic as  needed.

## 2014-10-14 NOTE — Patient Instructions (Signed)
Your physician has recommended you make the following change in your medication:  1)Decrease Amiodarone 200mg  once a day   Follow with Dr. Domenic Polite within one month.

## 2014-11-05 ENCOUNTER — Encounter: Payer: Self-pay | Admitting: Cardiology

## 2014-11-05 ENCOUNTER — Ambulatory Visit (INDEPENDENT_AMBULATORY_CARE_PROVIDER_SITE_OTHER): Payer: Medicare Other | Admitting: Cardiology

## 2014-11-05 VITALS — BP 133/78 | HR 76 | Ht 69.0 in | Wt 166.1 lb

## 2014-11-05 DIAGNOSIS — I2510 Atherosclerotic heart disease of native coronary artery without angina pectoris: Secondary | ICD-10-CM | POA: Diagnosis not present

## 2014-11-05 DIAGNOSIS — I480 Paroxysmal atrial fibrillation: Secondary | ICD-10-CM

## 2014-11-05 NOTE — Progress Notes (Signed)
Cardiology Office Note  Date: 11/05/2014   ID: Theresa Bird, DOB 1933/06/16, MRN [id number]  PCP: Celedonio Savage, MD  Primary Cardiologist: Rozann Lesches, MD   Chief Complaint  Patient presents with  . Coronary Artery Disease  . Atrial Fibrillation    History of Present Illness: Theresa Bird is a 81 y.o. female last seen in April, at which time she was noted to be back in rapid atrial fibrillation. I arranged an elective cardioversion, performed by Dr. Harl Bowie on April 29 with successful restoration of sinus rhythm. She was subsequently seen in the atrial fibrillation clinic in [place], and there were no significant adjustments made in her overall treatment approach. She was not felt to be a good candidate for ablation.  Follow-up ECG from May 10 showed sinus rhythm with nonspecific T-wave changes, normal QTC.  States she feels much better, has been compliant with her medications. No angina symptoms.   Past Medical History  Diagnosis Date  . Depression   . Essential hypertension, benign   . Hypercholesteremia   . Insulin dependent diabetes mellitus   . 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 Owens Loffler  . Coronary atherosclerosis of native coronary artery     Multivessel status post CABG in [place]  . Atrial fibrillation   . Myelofibrosis      Current Outpatient Prescriptions  Medication Sig Dispense Refill  . amiodarone (PACERONE) 200 MG tablet Take 1 tablet (200 mg total) by mouth daily. 60 tablet 3  . atorvastatin (LIPITOR) 20 MG tablet Take 1 tablet (20 mg total) by mouth daily at 6 PM. 30 tablet 11  . hydroxyurea (HYDREA) 500 MG capsule Take 500 mg by mouth 2 (two) times daily.    . hydrOXYzine (ATARAX/VISTARIL) 25 MG tablet Take 25 mg by mouth 4 (four) times daily.    . insulin NPH Human (HUMULIN N,NOVOLIN N) 100 UNIT/ML injection Inject 15-20 Units into the skin 2 (two) times daily before a meal. Inject 15 units before breakfast and 20  units before supper    . lisinopril (PRINIVIL,ZESTRIL) 20 MG tablet Take 1 tablet (20 mg total) by mouth at bedtime. 30 tablet 6  . metoprolol (LOPRESSOR) 25 MG tablet Take 1 tablet (25 mg total) by mouth 2 (two) times daily. 60 tablet 11  . OXYGEN Inhale 2 L into the lungs.    . rivaroxaban (XARELTO) 20 MG TABS tablet Take 1 tablet (20 mg total) by mouth daily after supper. 30 tablet 6  . sertraline (ZOLOFT) 50 MG tablet Take 50 mg by mouth daily.    Marland Kitchen sulfamethoxazole-trimethoprim (BACTRIM DS,SEPTRA DS) 800-160 MG per tablet Take 1 tablet by mouth every 12 (twelve) hours. 13 tablet 0  . traMADol (ULTRAM) 50 MG tablet Take 50 mg by mouth every 6 (six) hours as needed (pain).      No current facility-administered medications for this visit.    Allergies:  Review of patient's allergies indicates no known allergies.   Social History: The patient  reports that she has never smoked. She has never used smokeless tobacco. She reports that she drinks alcohol. She reports that she does not use illicit drugs.   ROS:  Please see the history of present illness. Otherwise, complete review of systems is positive for chronic dyspnea exertion, uses continuous oxygen.  All other systems are reviewed and negative.   Physical Exam: VS:  BP 133/78 mmHg  Pulse 76  Ht 5\' 9"  (1.753 m)  Wt  166 lb 1.9 oz (75.352 kg)  BMI 24.52 kg/m2  SpO2 98%, BMI Body mass index is 24.52 kg/(m^2).  Wt Readings from Last 3 Encounters:  11/05/14 166 lb 1.9 oz (75.352 kg)  10/03/14 166 lb (75.297 kg)  10/02/14 166 lb 12.8 oz (75.66 kg)     Appears comfortable at rest. Wearing oxygen via nasal cannula. HEENT: Conjunctiva and lids normal, oropharynx clear.  Neck: Supple, no elevated JVP or carotid bruits, no thyromegaly.  Lungs: Diminished breath sounds, clear, nonlabored breathing at rest.  Cardiac: RRR, no S3 or significant systolic murmur, no pericardial rub.  Abdomen: Soft, nontender, bowel sounds present.   Extremities: No pitting edema, distal pulses 1-2+.  Skin: Warm and dry.  Musculoskeletal: No kyphosis.  Neuropsychiatric: Alert and oriented x3, affect grossly appropriate.   ECG: ECG is not ordered today.   Recent Labwork: 09/07/2014: B Natriuretic Peptide 452.5* 10/03/2014: BUN 11; Creatinine 0.79; Hemoglobin 13.1; Platelets 629*; Potassium 4.9; Sodium 134* 10/14/2014: ALT 15; AST 22; TSH 1.906     Component Value Date/Time   CHOL 145 09/09/2014 1350   TRIG 79 09/09/2014 1350   HDL 29* 09/09/2014 1350   CHOLHDL 5.0 09/09/2014 1350   VLDL 16 09/09/2014 1350   LDLCALC 100* 09/09/2014 1350    Assessment and Plan:  1. Paroxysmal atrial fibrillation, currently maintaining sinus rhythm on amiodarone and following elective cardioversion last month. Follow-up arranged. She continues on Xarelto.  2. Multivessel CAD status post CABG, no active angina symptoms.  Current medicines were reviewed with the patient today.   Disposition: FU with me in 4 months.   Signed, Satira Sark, MD, [facility] 11/05/2014 3:42 PM    [facility] [place], [place], [place] [address] Phone: [phone]; Fax: [phone]

## 2014-11-05 NOTE — Patient Instructions (Signed)
Your physician wants you to follow-up in: 4 months with Dr. McDowell You will receive a reminder letter in the mail two months in advance. If you don't receive a letter, please call our office to schedule the follow-up appointment.  Your physician recommends that you continue on your current medications as directed. Please refer to the Current Medication list given to you today.  Thank you for choosing [facility]!!    

## 2014-12-10 ENCOUNTER — Telehealth: Payer: Self-pay | Admitting: Cardiology

## 2014-12-10 NOTE — Telephone Encounter (Signed)
HR was 65 this morning but they are unable to check blood pressure.

## 2014-12-10 NOTE — Telephone Encounter (Signed)
Any idea about her heart rate or blood pressure? Concern would be that she is back in atrial fibrillation with RVR. If heart rate is elevated, may need to be seen in ER for further medical adjustments and potentially cardioversion. If heart rate and blood pressure are normal however, might consider trying Lasix 20 mg as needed leg swelling and recheck an echocardiogram to ensure no change in LVEF.

## 2014-12-10 NOTE — Telephone Encounter (Signed)
Theresa Bird is having problems with her feet swelling for the past two days.

## 2014-12-10 NOTE — Telephone Encounter (Signed)
Would see if she can get blood pressure checked just to make sure that she is not hypotensive. If not, plan to consider as needed Lasix as mentioned previously would be the next step with echocardiogram.

## 2014-12-10 NOTE — Telephone Encounter (Signed)
Patient c/o of bilateral edema in feet, and some dizziness for the past two day. Patient denies increasing sodium in her diet. No weights have been taken. No c/o sob or chest pain. Please advise.

## 2014-12-12 NOTE — Telephone Encounter (Addendum)
Spoke with patient and she informed nurse that she saw her oncologist yesterday and her bp was slightly elevated. Per patient, her oncologist prescribed furosemide 20 mg daily for her. Nurse advised patient to contact our office back if her swelling didn't improve. Patient verbalized understanding of plan.

## 2014-12-12 NOTE — Addendum Note (Signed)
Addended by: Merlene Laughter on: 12/12/2014 03:59 PM   Modules accepted: Orders

## 2015-01-26 ENCOUNTER — Inpatient Hospital Stay (HOSPITAL_COMMUNITY)
Admission: EM | Admit: 2015-01-26 | Discharge: 2015-01-30 | DRG: 101 | Disposition: A | Discharge status: Skilled Nursing Facility | Payer: Medicare Other | Attending: Family Medicine | Admitting: Family Medicine

## 2015-01-26 ENCOUNTER — Encounter (HOSPITAL_COMMUNITY): Payer: Self-pay | Admitting: Emergency Medicine

## 2015-01-26 ENCOUNTER — Inpatient Hospital Stay (HOSPITAL_COMMUNITY): Payer: Medicare Other

## 2015-01-26 ENCOUNTER — Emergency Department (HOSPITAL_COMMUNITY): Payer: Medicare Other

## 2015-01-26 DIAGNOSIS — F329 Major depressive disorder, single episode, unspecified: Secondary | ICD-10-CM | POA: Diagnosis present

## 2015-01-26 DIAGNOSIS — R569 Unspecified convulsions: Secondary | ICD-10-CM

## 2015-01-26 DIAGNOSIS — E780 Pure hypercholesterolemia: Secondary | ICD-10-CM | POA: Diagnosis present

## 2015-01-26 DIAGNOSIS — Z9114 Patient's other noncompliance with medication regimen: Secondary | ICD-10-CM | POA: Diagnosis present

## 2015-01-26 DIAGNOSIS — D7581 Myelofibrosis: Secondary | ICD-10-CM | POA: Diagnosis not present

## 2015-01-26 DIAGNOSIS — G40901 Epilepsy, unspecified, not intractable, with status epilepticus: Secondary | ICD-10-CM | POA: Diagnosis not present

## 2015-01-26 DIAGNOSIS — L03031 Cellulitis of right toe: Secondary | ICD-10-CM | POA: Diagnosis present

## 2015-01-26 DIAGNOSIS — Z87891 Personal history of nicotine dependence: Secondary | ICD-10-CM | POA: Diagnosis not present

## 2015-01-26 DIAGNOSIS — I2510 Atherosclerotic heart disease of native coronary artery without angina pectoris: Secondary | ICD-10-CM | POA: Diagnosis present

## 2015-01-26 DIAGNOSIS — Z8249 Family history of ischemic heart disease and other diseases of the circulatory system: Secondary | ICD-10-CM | POA: Diagnosis not present

## 2015-01-26 DIAGNOSIS — I4891 Unspecified atrial fibrillation: Secondary | ICD-10-CM | POA: Diagnosis present

## 2015-01-26 DIAGNOSIS — J449 Chronic obstructive pulmonary disease, unspecified: Secondary | ICD-10-CM

## 2015-01-26 DIAGNOSIS — E785 Hyperlipidemia, unspecified: Secondary | ICD-10-CM | POA: Diagnosis present

## 2015-01-26 DIAGNOSIS — R4701 Aphasia: Secondary | ICD-10-CM | POA: Diagnosis present

## 2015-01-26 DIAGNOSIS — X58XXXA Exposure to other specified factors, initial encounter: Secondary | ICD-10-CM | POA: Diagnosis present

## 2015-01-26 DIAGNOSIS — E119 Type 2 diabetes mellitus without complications: Secondary | ICD-10-CM | POA: Diagnosis present

## 2015-01-26 DIAGNOSIS — B351 Tinea unguium: Secondary | ICD-10-CM | POA: Diagnosis present

## 2015-01-26 DIAGNOSIS — I639 Cerebral infarction, unspecified: Secondary | ICD-10-CM

## 2015-01-26 DIAGNOSIS — R05 Cough: Secondary | ICD-10-CM

## 2015-01-26 DIAGNOSIS — Z794 Long term (current) use of insulin: Secondary | ICD-10-CM | POA: Diagnosis not present

## 2015-01-26 DIAGNOSIS — D473 Essential (hemorrhagic) thrombocythemia: Secondary | ICD-10-CM | POA: Diagnosis present

## 2015-01-26 DIAGNOSIS — I480 Paroxysmal atrial fibrillation: Secondary | ICD-10-CM | POA: Diagnosis present

## 2015-01-26 DIAGNOSIS — I10 Essential (primary) hypertension: Secondary | ICD-10-CM | POA: Diagnosis present

## 2015-01-26 DIAGNOSIS — Z951 Presence of aortocoronary bypass graft: Secondary | ICD-10-CM | POA: Diagnosis not present

## 2015-01-26 DIAGNOSIS — Z9981 Dependence on supplemental oxygen: Secondary | ICD-10-CM | POA: Diagnosis not present

## 2015-01-26 DIAGNOSIS — T1490XA Injury, unspecified, initial encounter: Secondary | ICD-10-CM

## 2015-01-26 DIAGNOSIS — S92591A Other fracture of right lesser toe(s), initial encounter for closed fracture: Secondary | ICD-10-CM | POA: Diagnosis present

## 2015-01-26 DIAGNOSIS — R059 Cough, unspecified: Secondary | ICD-10-CM

## 2015-01-26 DIAGNOSIS — N390 Urinary tract infection, site not specified: Secondary | ICD-10-CM | POA: Diagnosis present

## 2015-01-26 LAB — I-STAT CHEM 8, ED
BUN: 26 mg/dL — ABNORMAL HIGH (ref 6–20)
Calcium, Ion: 1.06 mmol/L — ABNORMAL LOW (ref 1.13–1.30)
Chloride: 89 mmol/L — ABNORMAL LOW (ref 101–111)
Creatinine, Ser: 1 mg/dL (ref 0.44–1.00)
Glucose, Bld: 161 mg/dL — ABNORMAL HIGH (ref 65–99)
HCT: 48 % — ABNORMAL HIGH (ref 36.0–46.0)
Hemoglobin: 16.3 g/dL — ABNORMAL HIGH (ref 12.0–15.0)
Potassium: 3.4 mmol/L — ABNORMAL LOW (ref 3.5–5.1)
Sodium: 134 mmol/L — ABNORMAL LOW (ref 135–145)
TCO2: 33 mmol/L (ref 0–100)

## 2015-01-26 LAB — I-STAT TROPONIN, ED: Troponin i, poc: 0.01 ng/mL (ref 0.00–0.08)

## 2015-01-26 LAB — URINALYSIS, ROUTINE W REFLEX MICROSCOPIC
BILIRUBIN URINE: NEGATIVE
GLUCOSE, UA: NEGATIVE mg/dL
Ketones, ur: NEGATIVE mg/dL
NITRITE: NEGATIVE
PH: 7 (ref 5.0–8.0)
Protein, ur: 30 mg/dL — AB
SPECIFIC GRAVITY, URINE: 1.013 (ref 1.005–1.030)
Urobilinogen, UA: 1 mg/dL (ref 0.0–1.0)

## 2015-01-26 LAB — CBC WITH DIFFERENTIAL/PLATELET
BASOS ABS: 0 10*3/uL (ref 0.0–0.1)
Basophils Relative: 0 % (ref 0–1)
EOS ABS: 0.3 10*3/uL (ref 0.0–0.7)
Eosinophils Relative: 1 % (ref 0–5)
HCT: 39.5 % (ref 36.0–46.0)
Hemoglobin: 14 g/dL (ref 12.0–15.0)
LYMPHS ABS: 2.9 10*3/uL (ref 0.7–4.0)
Lymphocytes Relative: 9 % — ABNORMAL LOW (ref 12–46)
MCH: 39.8 pg — AB (ref 26.0–34.0)
MCHC: 35.4 g/dL (ref 30.0–36.0)
MCV: 112.2 fL — AB (ref 78.0–100.0)
Monocytes Absolute: 2.6 10*3/uL — ABNORMAL HIGH (ref 0.1–1.0)
Monocytes Relative: 8 % (ref 3–12)
NEUTROS ABS: 26.2 10*3/uL — AB (ref 1.7–7.7)
Neutrophils Relative %: 82 % — ABNORMAL HIGH (ref 43–77)
PLATELETS: 1095 10*3/uL — AB (ref 150–400)
RBC: 3.52 MIL/uL — ABNORMAL LOW (ref 3.87–5.11)
RDW: 17.9 % — AB (ref 11.5–15.5)
Smear Review: INCREASED
WBC: 32 10*3/uL — ABNORMAL HIGH (ref 4.0–10.5)

## 2015-01-26 LAB — RAPID URINE DRUG SCREEN, HOSP PERFORMED
Amphetamines: NOT DETECTED
BENZODIAZEPINES: NOT DETECTED
Barbiturates: NOT DETECTED
COCAINE: NOT DETECTED
OPIATES: NOT DETECTED
TETRAHYDROCANNABINOL: NOT DETECTED

## 2015-01-26 LAB — CBG MONITORING, ED: Glucose-Capillary: 163 mg/dL — ABNORMAL HIGH (ref 65–99)

## 2015-01-26 LAB — URINE MICROSCOPIC-ADD ON

## 2015-01-26 LAB — PROTIME-INR
INR: 2.27 — AB (ref 0.00–1.49)
Prothrombin Time: 24.8 seconds — ABNORMAL HIGH (ref 11.6–15.2)

## 2015-01-26 LAB — GLUCOSE, CAPILLARY: Glucose-Capillary: 187 mg/dL — ABNORMAL HIGH (ref 65–99)

## 2015-01-26 LAB — ETHANOL: Alcohol, Ethyl (B): <5 mg/dL (ref <5)

## 2015-01-26 MED ORDER — CETYLPYRIDINIUM CHLORIDE 0.05 % MT LIQD
7.0000 mL | Freq: Two times a day (BID) | OROMUCOSAL | Status: DC
  Administered 2015-01-26 – 2015-01-30 (×7): 7 mL via OROMUCOSAL

## 2015-01-26 MED ORDER — DILTIAZEM HCL 100 MG IV SOLR
5.0000 mg/h | INTRAVENOUS | Status: DC
  Administered 2015-01-26: 5 mg/h via INTRAVENOUS
  Administered 2015-01-26: 15 mg/h via INTRAVENOUS
  Administered 2015-01-26: 5 mg/h via INTRAVENOUS
  Administered 2015-01-27 – 2015-01-28 (×5): 15 mg/h via INTRAVENOUS
  Filled 2015-01-26 (×9): qty 100

## 2015-01-26 MED ORDER — DILTIAZEM LOAD VIA INFUSION
20.0000 mg | Freq: Once | INTRAVENOUS | Status: AC
  Administered 2015-01-26: 20 mg via INTRAVENOUS
  Filled 2015-01-26: qty 20

## 2015-01-26 MED ORDER — SODIUM CHLORIDE 0.9 % IV BOLUS (SEPSIS)
500.0000 mL | Freq: Once | INTRAVENOUS | Status: AC
  Administered 2015-01-26: 500 mL via INTRAVENOUS

## 2015-01-26 MED ORDER — INSULIN ASPART 100 UNIT/ML ~~LOC~~ SOLN
0.0000 [IU] | Freq: Every day | SUBCUTANEOUS | Status: DC
  Administered 2015-01-28 – 2015-01-29 (×2): 2 [IU] via SUBCUTANEOUS

## 2015-01-26 MED ORDER — DILTIAZEM HCL 25 MG/5ML IV SOLN: 20.0000 mg | Freq: Once | INTRAVENOUS | Status: DC

## 2015-01-26 MED ORDER — SODIUM CHLORIDE 0.9 % IV SOLN
1000.0000 mg | Freq: Once | INTRAVENOUS | Status: AC
  Administered 2015-01-26: 1000 mg via INTRAVENOUS
  Filled 2015-01-26: qty 10

## 2015-01-26 MED ORDER — ONDANSETRON HCL 4 MG PO TABS
4.0000 mg | ORAL_TABLET | Freq: Four times a day (QID) | ORAL | Status: DC | PRN
Start: 2015-01-26 — End: 2015-01-30

## 2015-01-26 MED ORDER — SERTRALINE HCL 50 MG PO TABS: 50.0000 mg | ORAL_TABLET | Freq: Every day | ORAL | Status: DC

## 2015-01-26 MED ORDER — ACETAMINOPHEN 325 MG PO TABS
650.0000 mg | ORAL_TABLET | Freq: Four times a day (QID) | ORAL | Status: DC | PRN
  Administered 2015-01-27: 650 mg via ORAL
  Filled 2015-01-26: qty 2

## 2015-01-26 MED ORDER — SODIUM CHLORIDE 0.9 % IJ SOLN
3.0000 mL | Freq: Two times a day (BID) | INTRAMUSCULAR | Status: DC
  Administered 2015-01-26 – 2015-01-30 (×4): 3 mL via INTRAVENOUS

## 2015-01-26 MED ORDER — ALBUTEROL SULFATE (2.5 MG/3ML) 0.083% IN NEBU: 3.0000 mL | INHALATION_SOLUTION | Freq: Four times a day (QID) | RESPIRATORY_TRACT | Status: DC | PRN

## 2015-01-26 MED ORDER — SERTRALINE HCL 50 MG PO TABS
50.0000 mg | ORAL_TABLET | Freq: Every day | ORAL | Status: DC
  Administered 2015-01-27 – 2015-01-30 (×4): 50 mg via ORAL
  Filled 2015-01-26 (×4): qty 1

## 2015-01-26 MED ORDER — HYDROXYUREA 500 MG PO CAPS
500.0000 mg | ORAL_CAPSULE | Freq: Two times a day (BID) | ORAL | Status: DC
  Administered 2015-01-26 – 2015-01-30 (×8): 500 mg via ORAL
  Filled 2015-01-26 (×10): qty 1

## 2015-01-26 MED ORDER — ONDANSETRON HCL 4 MG/2ML IJ SOLN: 4.0000 mg | Freq: Four times a day (QID) | INTRAMUSCULAR | Status: DC | PRN

## 2015-01-26 MED ORDER — ATORVASTATIN CALCIUM 40 MG PO TABS
40.0000 mg | ORAL_TABLET | Freq: Every day | ORAL | Status: DC
  Administered 2015-01-26 – 2015-01-29 (×4): 40 mg via ORAL
  Filled 2015-01-26 (×4): qty 1

## 2015-01-26 MED ORDER — INSULIN ASPART 100 UNIT/ML ~~LOC~~ SOLN
0.0000 [IU] | Freq: Three times a day (TID) | SUBCUTANEOUS | Status: DC
  Administered 2015-01-27: 3 [IU] via SUBCUTANEOUS
  Administered 2015-01-27: 5 [IU] via SUBCUTANEOUS
  Administered 2015-01-27: 3 [IU] via SUBCUTANEOUS
  Administered 2015-01-28: 5 [IU] via SUBCUTANEOUS
  Administered 2015-01-28: 2 [IU] via SUBCUTANEOUS
  Administered 2015-01-28: 7 [IU] via SUBCUTANEOUS
  Administered 2015-01-29: 3 [IU] via SUBCUTANEOUS
  Administered 2015-01-29: 5 [IU] via SUBCUTANEOUS
  Administered 2015-01-29 – 2015-01-30 (×2): 3 [IU] via SUBCUTANEOUS
  Administered 2015-01-30: 5 [IU] via SUBCUTANEOUS

## 2015-01-26 MED ORDER — RIVAROXABAN 20 MG PO TABS
20.0000 mg | ORAL_TABLET | Freq: Every day | ORAL | Status: DC
  Administered 2015-01-26 – 2015-01-29 (×4): 20 mg via ORAL
  Filled 2015-01-26 (×4): qty 1

## 2015-01-26 MED ORDER — DILTIAZEM HCL 100 MG IV SOLR
INTRAVENOUS | Status: AC
  Administered 2015-01-26: 5 mg/h via INTRAVENOUS
  Filled 2015-01-26: qty 100

## 2015-01-26 MED ORDER — DILTIAZEM HCL 25 MG/5ML IV SOLN
10.0000 mg | Freq: Once | INTRAVENOUS | Status: AC
  Administered 2015-01-26: 10 mg via INTRAVENOUS

## 2015-01-26 MED ORDER — ACETAMINOPHEN 650 MG RE SUPP: 650.0000 mg | Freq: Four times a day (QID) | RECTAL | Status: DC | PRN

## 2015-01-26 NOTE — H&P (Signed)
[facility] Admission History and Physical Service Pager: [phone]  Patient name: Theresa Bird Medical record number: [id number] Date of birth: [birth year] Age: 81 y.o. Gender: female  Primary Care Provider: Celedonio Savage, MD Consultants: Neurology Code Status: FULL CODE  Chief Complaint: Witnessed Seizure-Like Event  Assessment and Plan: Theresa Bird is a 81 y.o. female who presents following seizure-like event. PMH is significant for seizure disorder, paroxysmal atrial fibrillation on Xarelto, essential thrombocytosis, COPD, and CAD s/p CABG.  Seizure-Like Event: Patient with reported history of unspecified seizure disorder and witnessed event consistent with report of true seizure. Unfortunately, limited information is available at present regarding seizure disorder and patient's current medication regimen. Patient appears to be post-ictal in the ED; will continue to monitor for repeat seizure activity and plan to discuss medication with patient once she is no longer post-ictal. PCP unaware of seizure disorder or use of antiepileptics. Considering report by patient's daughter that she is often noncompliant with her medications, likely medication non-adherence as cause for seizure episode. Labs notable for WBC 32 with platelets 1095. Will evaluate for infection as trigger for seizure activity. - Per Neurology, MRI brain and EEG. Recommend stroke workup only if MRI is positive. - RN swallow eval, okay to add diet if passes - 1 dose Keppra given. Will evaluate chronic AED therapy pending workup. - CXR to evaluate for pneumonia as infectious source - Urine toxicology - UA - Daily CBC, BMP  Paroxysmal Atrial Fibrillation: Per EMR, patient with first episode of PAF with successful cardioversion in 2014. Most recently, she was admitted 09/07/14 at [facility] and found to be in atrial fibrillation with RVR requiring cardioversion; started on amiodarone IV at that time  and subsequently transitioned to 200 mg BID (with no reported smoking history, wonder if amiodarone is contributing to her respiratory decline). Found to be in atrial fibrillation again during outpatient follow-up and underwent successful DCCV 10/03/14. Symptoms when in atrial fibrillation are fatigue and dyspnea, no sensation of palpations. Pt is on diltiazem 120mg  24hr tablet at home. - Diltiazem 20 mg IV given once. Will continue diltiazem continuous drip overnight. - Continuous cardiac monitoring - continue home xarelto  Essential Thrombocytosis: Baseline platelets 500-600, currently increased to 1095. Per EMR, patient on hydroxyurea 500 mg daily. Suspect nonadherence as labs reviewed in care everywhere from last onc note were better. - Continue hydroxyurea 500 mg daily  COPD: Patient denies history of tobacco use. Per EMR and patient report, has been wearing continuous O2 via nasal cannula at home for the past two years. - Continue O2 via [place] with target O2 saturations >94% - albuterol PRN  Hypertension: Per EMR, patient on lisinopril 20 mg daily, lasix 20mg  daily and metoprolol 50 mg BID. Will hold antihypertensives pending results of MRI brain.  Diabetes Mellitus: Per EMR, patient on Novolin 70-30 15 BID at home. - Transition to SSI while NPO  Depression: Per EMR, patient on sertraline 50 mg daily. Will discuss with patient once no longer post-ictal.  Multivessel CAD s/p CABG: Continue to monitor.  FEN/GI: NPO while post-ictal. Will advance diet as tolerated. Prophylaxis: Continue home Xarelto as above.  Disposition: Admit for further inpatient management.  History of Present Illness: Theresa Bird is a 81 y.o. female with history of unspecified seizure disorder, paroxysmal atrial fibrillation s/p cardioversion x3 on Xarelto, essential thrombocytosis, COPD, DM, and CAD s/p CABG who presents following single seizure-like event today at 11:55 AM. History obtained from friend who  witnessed  the event. Friend reports she had been in her normal state this morning while visiting her significant other who is currently admitted to [facility]. While being transported outside via wheelchair, she suddenly extended both upper extremities, her eyes rolled backward, and she began to shake throughout her entire body. Friend also reports patient began to scream. Event also witnessed by 2 RNs. Per RN in ED, patient also lost control of bladder during this event. Patient transported to the ED, where she was found to be lethargic with difficulty concentrating and following commands.  While recovering in the ED, she was found to be in atrial fibrillation with rates in the 170s and was given Diltiazem 20 mg bolus followed by an infusion started at 5 mg/hr. No further seizure activity witnessed in the ED. Patient subsequently found to be aphasic and Code Stroke was activated.  Per friend at bedside who spoke with daughter in [place], patient's daughter reports that patient has a long history of seizures. Also reports that patient is on antiepileptics at home, though not always compliant. Patient reports taking these medicines, however cannot specify when last took. Friend unable to report on onset or frequency of seizure events. PCP contacted and unaware of any seizure disorder or use of antiepileptics.  Review Of Systems: Per HPI. Otherwise, 12 point review of systems was performed and was unremarkable.  Patient Active Problem List   Diagnosis Date Noted  . Seizure 01/26/2015  . Phlebitis   . Screen for STD (sexually transmitted disease)   . Essential thrombocythemia   . Myelofibrosis   . UTI (urinary tract infection) 09/08/2014  . Atrial fibrillation with RVR 09/07/2014  . Cellulitis of left upper arm 09/07/2014  . Paroxysmal atrial fibrillation 04/08/2013  . Essential hypertension 04/08/2013  . CAFL (chronic airflow limitation) 05/11/2012  . Essential hemorrhagic  thrombocythemia 05/11/2012  . Clinical depression 07/13/2011  . Diabetes 07/13/2011  . DM2 (diabetes mellitus, type 2) 06/19/2009  . Dyslipidemia 06/19/2009  . CORONARY ATHEROSCLEROSIS NATIVE CORONARY ARTERY 06/19/2009   Past Medical History: Past Medical History  Diagnosis Date  . Depression   . Essential hypertension, benign   . Hypercholesteremia   . Insulin dependent diabetes mellitus   . 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 Owens Loffler  . Coronary atherosclerosis of native coronary artery     Multivessel status post CABG in [place]  . Atrial fibrillation   . Myelofibrosis    Past Surgical History: Past Surgical History  Procedure Laterality Date  . Abdominal hysterectomy    . Cataract extraction    . Coronary artery bypass graft      Possibly 2002 in [place]  . Cardioversion N/A 09/09/2014    Procedure: CARDIOVERSION;  Surgeon: Josue Hector, MD;  Location: [facility] ENDOSCOPY;  Service: Cardiovascular;  Laterality: N/A;  . Cardioversion N/A 10/03/2014    Procedure: CARDIOVERSION;  Surgeon: Arnoldo Lenis, MD;  Location: [facility] ORS;  Service: Endoscopy;  Laterality: N/A;   Social History: Social History  Substance Use Topics  . Smoking status: Never Smoker   . Smokeless tobacco: Never Used  . Alcohol Use: 0.0 oz/week    0 Standard drinks or equivalent per week     Comment: Socially   Additional social history: Patient typically lives at home with significant other; however, he has been admitted to [facility] for the past month and she has been living alone. Friends present report that she is typically self sufficient at  home, able to fulfill ADLs and IADLs appropriately.  Please also refer to relevant sections of EMR.  Family History: Family History  Problem Relation Age of Onset  . Coronary artery disease     Allergies and Medications: No Known Allergies No current facility-administered medications on file prior to encounter.    Current Outpatient Prescriptions on File Prior to Encounter  Medication Sig Dispense Refill  . amiodarone (PACERONE) 200 MG tablet Take 1 tablet (200 mg total) by mouth daily. 60 tablet 3  . atorvastatin (LIPITOR) 20 MG tablet Take 1 tablet (20 mg total) by mouth daily at 6 PM. 30 tablet 11  . furosemide (LASIX) 20 MG tablet Take 1 tablet (20 mg total) by mouth daily. 90 tablet 0  . hydroxyurea (HYDREA) 500 MG capsule Take 500 mg by mouth 2 (two) times daily.    . hydrOXYzine (ATARAX/VISTARIL) 25 MG tablet Take 25 mg by mouth 4 (four) times daily.    . insulin NPH Human (HUMULIN N,NOVOLIN N) 100 UNIT/ML injection Inject 15-20 Units into the skin 2 (two) times daily before a meal. Inject 15 units before breakfast and 20 units before supper    . lisinopril (PRINIVIL,ZESTRIL) 20 MG tablet Take 1 tablet (20 mg total) by mouth at bedtime. 30 tablet 6  . metoprolol (LOPRESSOR) 25 MG tablet Take 1 tablet (25 mg total) by mouth 2 (two) times daily. 60 tablet 11  . OXYGEN Inhale 2 L into the lungs.    . rivaroxaban (XARELTO) 20 MG TABS tablet Take 1 tablet (20 mg total) by mouth daily after supper. 30 tablet 6  . sertraline (ZOLOFT) 50 MG tablet Take 50 mg by mouth daily.    Marland Kitchen sulfamethoxazole-trimethoprim (BACTRIM DS,SEPTRA DS) 800-160 MG per tablet Take 1 tablet by mouth every 12 (twelve) hours. 13 tablet 0  . traMADol (ULTRAM) 50 MG tablet Take 50 mg by mouth every 6 (six) hours as needed (pain).       Objective: BP 138/89 mmHg  Pulse 159  Temp(Src) 97.9 F (36.6 C) (Oral)  Resp 16  SpO2 96% Exam: General: Patient is thin and apparently malnourished. Appears to be in generally poor health at baseline.  Eyes: PERRL, EOMI. Sclera clear with pink conjunctiva. ENTM: Oropharynx clear. Notably poor dentition. Neck: Neck supple with full range of motion. Cardiovascular: Tachycardic with irregular rhythm. No murmurs, rubs, or gallops. 2+ radial, PT pulses bilaterally. No LE edema. Respiratory:  Normal work of breathing on 2L [place]. Patient breathing comfortably while lying flat; however, began to cough upon sitting forward. Lungs with mild crackles diffusely, though no wheezes or rhonchi. MSK: Appropriate tone and range of motion in the extremities bilaterally. Skin: No visible rashes or lesions. Neuro: Patient awake and alert but with difficulty concentrating and following commands during exam. Oriented only to person; oriented to neither time nor place. Patient with difficulty concentrating throughout exam. PERRLA. EOMI. Facial sensation intact and symmetric. Facial movement appropriate and symmetric. Hearing intact. Symmetric palate rise. Shoulder shrug intact. Tongue midline. Normal strength and sensation in the extremities bilaterally. No pronator drift. Downgoing babinski bilaterally  Labs and Imaging: Recent Results (from the past 2160 hour(s))  CBG monitoring, ED     Status: Abnormal   Collection Time: 01/26/15 12:29 PM  Result Value Ref Range   Glucose-Capillary 163 (H) 65 - 99 mg/dL  Ethanol     Status: None   Collection Time: 01/26/15 12:48 PM  Result Value Ref Range   Alcohol, Ethyl (B) <5 <5 mg/dL  Comment:        LOWEST DETECTABLE LIMIT FOR SERUM ALCOHOL IS 5 mg/dL FOR MEDICAL PURPOSES ONLY   Protime-INR     Status: Abnormal   Collection Time: 01/26/15 12:48 PM  Result Value Ref Range   Prothrombin Time 24.8 (H) 11.6 - 15.2 seconds   INR 2.27 (H) 0.00 - 1.49  CBC WITH DIFFERENTIAL     Status: Abnormal   Collection Time: 01/26/15 12:48 PM  Result Value Ref Range   WBC 32.0 (H) 4.0 - 10.5 K/uL   RBC 3.52 (L) 3.87 - 5.11 MIL/uL   Hemoglobin 14.0 12.0 - 15.0 g/dL   HCT 39.5 36.0 - 46.0 %   MCV 112.2 (H) 78.0 - 100.0 fL   MCH 39.8 (H) 26.0 - 34.0 pg   MCHC 35.4 30.0 - 36.0 g/dL   RDW 17.9 (H) 11.5 - 15.5 %   Platelets 1095 (HH) 150 - 400 K/uL    Comment: REPEATED TO VERIFY CRITICAL RESULT CALLED TO, READ BACK BY AND VERIFIED WITH: ROBERSON,H RN @ 1325 01/26/15  LEONARD,A    Neutrophils Relative % 82 (H) 43 - 77 %   Lymphocytes Relative 9 (L) 12 - 46 %   Monocytes Relative 8 3 - 12 %   Eosinophils Relative 1 0 - 5 %   Basophils Relative 0 0 - 1 %   Neutro Abs 26.2 (H) 1.7 - 7.7 K/uL   Lymphs Abs 2.9 0.7 - 4.0 K/uL   Monocytes Absolute 2.6 (H) 0.1 - 1.0 K/uL   Eosinophils Absolute 0.3 0.0 - 0.7 K/uL   Basophils Absolute 0.0 0.0 - 0.1 K/uL   RBC Morphology POLYCHROMASIA PRESENT    WBC Morphology TOXIC GRANULATION     Comment: MILD LEFT SHIFT (1-5% METAS, OCC MYELO, OCC BANDS)   Smear Review PLATELETS APPEAR INCREASED     Comment: LARGE PLATELETS PRESENT  I-stat troponin, ED (not at [facility], [facility])     Status: None   Collection Time: 01/26/15  1:17 PM  Result Value Ref Range   Troponin i, poc 0.01 0.00 - 0.08 ng/mL   Comment 3            Comment: Due to the release kinetics of cTnI, a negative result within the first hours of the onset of symptoms does not rule out myocardial infarction with certainty. If myocardial infarction is still suspected, repeat the test at appropriate intervals.   I-Stat Chem 8, ED  (not at [facility], [facility])     Status: Abnormal   Collection Time: 01/26/15  1:18 PM  Result Value Ref Range   Sodium 134 (L) 135 - 145 mmol/L   Potassium 3.4 (L) 3.5 - 5.1 mmol/L   Chloride 89 (L) 101 - 111 mmol/L   BUN 26 (H) 6 - 20 mg/dL   Creatinine, Ser 1.00 0.44 - 1.00 mg/dL   Glucose, Bld 161 (H) 65 - 99 mg/dL   Calcium, Ion 1.06 (L) 1.13 - 1.30 mmol/L   TCO2 33 0 - 100 mmol/L   Hemoglobin 16.3 (H) 12.0 - 15.0 g/dL   HCT 48.0 (H) 36.0 - 46.0 %   CT HEAD WITHOUT CONTRAST: FINDINGS: Fluid level and some calcified material in the right maxillary sinus. Other Visualized paranasal sinuses and mastoids are clear. Osteopenia. No acute osseous abnormality identified. No acute orbit or scalp soft tissue findings identified.  Calcified atherosclerosis at the skull base. Cerebral volume is within normal limits for age. Patchy bilateral  cerebral white matter hypodensity. Confluent hyperdensity  in the right deep gray matter nuclei and nearest the caudate (series 21, image 18). No acute cortically based infarct identified. No suspicious intracranial vascular hyperdensity. No acute intracranial hemorrhage identified. No midline shift, mass effect, or evidence of intracranial mass lesion. No ventriculomegaly. No cortical encephalomalacia identified.  IMPRESSION: 1. Cerebral white matter disease and changes in the right deep gray matter in nuclei compatible with age indeterminate small vessel ischemia. 2. No acute cortically based infarct or intracranial hemorrhage.  EKG: Atrial fibrillation, rate 162 BPM, with RVR. Borderline R axis deviation.  Orion Crook, Med Student 01/26/2015, 3:26 PM MS4, [place] Intern pager: [phone], text pages welcome  RESIDENT ADDENDUM I have separately seen and examined the patient. I have discussed the findings and exam with the medical student and agree with the above note. Additionally I have outlined my exam and assessment/plan below:  PE: General: NAD, laying flat with eyes open HEENT: PERRL, EOMI, no oropharyngeal lesions CV: Tachycardic, irregular rhythm, no murmurs appreciated. No LE edema Resp: coarse breath sounds bilaterally with cough on deep inspiration, normal effort Abd: soft, nontender, nondistended, normal bowel sounds Extremities: no LE edema. No deformities Neuro: alert, oriented only to person. No deficits noted. Pt is only somewhat cooperative with exam, most testing is limited by patient effort. Skin: no rashes appreciated  A/P: Theresa Bird is a 81 y.o. female admitted for suspected seizure and afib with RVR. PMH significant for CAD s/p CABG, HTN, HLD, T2DM, Essential thrombocytosis,   # Seizure: Apparently has a history of this but not known by current healthcare providers in [place]. DDx would also include stroke, infection. WBC  significantly elevated at 32 as well as platelets at 1095, however could be related to ET or seizure. No other evidence of  - appreciate neuro recs - will get MRI brain and EEG - s/p keppra in ED  # Afib with RVR: on xarelto and 120mg  diltiazem at home as well as metoprolol and amiodarone... Concern about respiratory side effects with amiodarone.  - diltiazem drip - cardiac monitoring - continue xarelto - hold amiodarone  # COPD - continue O2 by [place] - albuterol PRN - CXR  # HTN:  - hold anti-HTN until MRI shows no evidence of stroke  # FEN/GI: - NPO pending RN swallow screen, then heart healthy diet - on full dose anticoagulation  Dispo: admit to SDU for dilt drip  Tawanna Sat, MD 01/26/2015, 5:02 PM PGY-3, [place] Intern Pager: [phone], text pages welcome

## 2015-01-26 NOTE — Consult Note (Signed)
Neurology Consultation Reason for Consult: Aphasia Referring Physician: Alyson Locket  CC: Stroke  History is obtained from:Nursing, patient  HPI: Theresa Bird is a 81 y.o. female with a history of afib on xarelto who presents with sudden change in mental status. Seh was in her normal state earlier then was seen to have what was suspected to be a seizure in the ER. She was found to be in afib with rates in the 170s and was started on dilt. As she was recovering, it became apparent that she was aphasic and a code stroke was called.   Head CT negative for acute stroke.    LKW: 12 pm  tpa given?: no, anticoagulation.     ROS:  Unable to obtain due to altered mental status.   Past Medical History  Diagnosis Date  . Depression   . Essential hypertension, benign   . Hypercholesteremia   . Insulin dependent diabetes mellitus   . 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 Owens Loffler  . Coronary atherosclerosis of native coronary artery     Multivessel status post CABG in [place]  . Atrial fibrillation   . Myelofibrosis      Family History  Problem Relation Age of Onset  . Coronary artery disease       Social History:  reports that she has never smoked. She has never used smokeless tobacco. She reports that she drinks alcohol. She reports that she does not use illicit drugs.   Exam: Current vital signs: BP 120/71 mmHg  Pulse 151  Temp(Src) 97.9 F (36.6 C) (Oral)  Resp 19  SpO2 96% Vital signs in last 24 hours: Temp:  [97.9 F (36.6 C)] 97.9 F (36.6 C) (08/22 1240) Pulse Rate:  [151-175] 151 (08/22 1323) Resp:  [19-20] 19 (08/22 1323) BP: (112-169)/(69-84) 120/71 mmHg (08/22 1323) SpO2:  [96 %-100 %] 96 % (08/22 1323)   Physical Exam  Constitutional: Appears well-developed and well-nourished.  Psych: Affect appropriate to situation Eyes: No scleral injection HENT: No OP obstrucion Head: Normocephalic.  Cardiovascular: rapid rate Respiratory:  Effort normal  GI: Soft.  No distension. There is no tenderness.  Skin: WDI  Neuro: Mental Status: Patient is awake, alert, She has a moderate aphasia, is able to tell me her name and follow some commands. She did show improvement over the course of the evaluation  Cranial Nerves: II: Visual Fields are full. Pupils are equal, round, and reactive to light.   III,IV, VI: EOMI without ptosis or diploplia.  V: Facial sensation is symmetric to temperature VII: Facial movement is symmetric.  VIII: hearing is intact to voice X: Uvula elevates symmetrically XI: Shoulder shrug is symmetric. XII: tongue is midline without atrophy or fasciculations.  Motor: Tone is normal. Bulk is normal. 5/5 strength was present in all four extremities.  Sensory: Sensation is symmetric to light touch  Cerebellar: No clear ataxia         I have reviewed labs in epic and the results pertinent to this consultation are: Elevated INR  I have reviewed the images obtained: CT head - no acute findings.   Impression: 81 yo F with new onset seizure. Possibilities include small infarct causing seizure vs unprovoked seizure. Seizure is a significant physiological stressor and can cause people to flip in to afib. Alternatively, even though she is anticoagulated she could have had a small ischemic infarct due to the afib and caused a seizure. Further investigation is required.   Recommendations:  1) MRI brain 2) EEG 3) stroke workup only if MRI is positive.  4) Will give one time dose of IV keppra, however will need to decide about chronic AED therapy pending workup.  5) Will continue to follow.    Roland Rack, MD [facility] [phone]  If 7pm- 7am, please page neurology on call as listed in [place].

## 2015-01-26 NOTE — ED Provider Notes (Signed)
CSN: [id number]     Arrival date & time 01/26/15  1225 History   First MD Initiated Contact with Patient 01/26/15 1241     Chief Complaint  Patient presents with  . Code Stroke  . Seizures    An emergency department physician performed an initial assessment on this suspected stroke patient at 1255.  HPI  Patient presents for evaluation of a possible seizure. No reported history of seizure. Patient is visiting her husband who is admitted to the hospital. She was being wheeled to a car that was a Dance movement psychotherapist. She became stiff and unresponsive with stereo typical seizure activity and a wheelchair. Was evaluated by the nurse rapid response team and brought to emergency room for evaluation. Noted to be in rapid atrial fibrillation upon arrival.  Her chart and ONLY per patient family has history of paroxysmal atrial fibrillation. Generally cardioversion in April. Isn't acquainted with Xarelto. Rate control with metoprolol. Amiodarone his antiarrhythmics. Denies history of seizure.  Past Medical History  Diagnosis Date  . Depression   . Essential hypertension, benign   . Hypercholesteremia   . Insulin dependent diabetes mellitus   . 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 Owens Loffler  . Coronary atherosclerosis of native coronary artery     Multivessel status post CABG in [place]  . Atrial fibrillation   . Myelofibrosis    Past Surgical History  Procedure Laterality Date  . Abdominal hysterectomy    . Cataract extraction    . Coronary artery bypass graft      Possibly 2002 in [place]  . Cardioversion N/A 09/09/2014    Procedure: CARDIOVERSION;  Surgeon: Josue Hector, MD;  Location: [facility] ENDOSCOPY;  Service: Cardiovascular;  Laterality: N/A;  . Cardioversion N/A 10/03/2014    Procedure: CARDIOVERSION;  Surgeon: Arnoldo Lenis, MD;  Location: [facility] ORS;  Service: Endoscopy;  Laterality: N/A;   Family History  Problem Relation Age of Onset  . Coronary artery disease      Social History  Substance Use Topics  . Smoking status: Never Smoker   . Smokeless tobacco: Never Used  . Alcohol Use: 0.0 oz/week    0 Standard drinks or equivalent per week     Comment: Socially   OB History    No data available     Review of Systems  Unable to perform ROS: Other   On arrival patient only able to respond with her name. Also is able answer some simple questions. Complains of mild headache. No focal weakness.   Allergies  Review of patient's allergies indicates no known allergies.  Home Medications   Prior to Admission medications   Medication Sig Start Date End Date Taking? Authorizing Provider  amiodarone (PACERONE) 200 MG tablet Take 1 tablet (200 mg total) by mouth daily. 10/14/14   Sherran Needs, NP  atorvastatin (LIPITOR) 20 MG tablet Take 1 tablet (20 mg total) by mouth daily at 6 PM. 09/11/14   Evelene Croon Barrett, PA-C  furosemide (LASIX) 20 MG tablet Take 1 tablet (20 mg total) by mouth daily. 12/12/14   Satira Sark, MD  hydroxyurea (HYDREA) 500 MG capsule Take 500 mg by mouth 2 (two) times daily. 04/14/14   Historical Provider, MD  hydrOXYzine (ATARAX/VISTARIL) 25 MG tablet Take 25 mg by mouth 4 (four) times daily.    Historical Provider, MD  insulin NPH Human (HUMULIN N,NOVOLIN N) 100 UNIT/ML injection Inject 15-20 Units into the skin 2 (two) times daily before a  meal. Inject 15 units before breakfast and 20 units before supper    Historical Provider, MD  lisinopril (PRINIVIL,ZESTRIL) 20 MG tablet Take 1 tablet (20 mg total) by mouth at bedtime. 09/11/14   Rhonda G Barrett, PA-C  metoprolol (LOPRESSOR) 25 MG tablet Take 1 tablet (25 mg total) by mouth 2 (two) times daily. 09/11/14   Rhonda G Barrett, PA-C  OXYGEN Inhale 2 L into the lungs.    Historical Provider, MD  rivaroxaban (XARELTO) 20 MG TABS tablet Take 1 tablet (20 mg total) by mouth daily after supper. 09/11/14   Rhonda G Barrett, PA-C  sertraline (ZOLOFT) 50 MG tablet Take 50 mg by mouth daily.     Historical Provider, MD  traMADol (ULTRAM) 50 MG tablet Take 50 mg by mouth every 6 (six) hours as needed (pain).     Historical Provider, MD   BP 138/89 mmHg  Pulse 159  Temp(Src) 97.9 F (36.6 C) (Oral)  Resp 16  SpO2 96% Physical Exam  Constitutional: She is oriented to person, place, and time. She appears well-developed and well-nourished. No distress.  HENT:  Head: Normocephalic.  Eyes: Conjunctivae are normal. Pupils are equal, round, and reactive to light. No scleral icterus.  Neck: Normal range of motion. Neck supple. No thyromegaly present.  Cardiovascular: Normal rate and regular rhythm.  Exam reveals no gallop and no friction rub.   No murmur heard. Pulmonary/Chest: Effort normal and breath sounds normal. No respiratory distress. She has no wheezes. She has no rales.  Abdominal: Soft. Bowel sounds are normal. She exhibits no distension. There is no tenderness. There is no rebound.  Musculoskeletal: Normal range of motion.  Neurological: She is alert and oriented to person, place, and time.  Only able to answer with her name. Otherwise aphasic.  No pronator drift. No leg drift. Able to follow commands with 4 extremities. No facial droop.  Skin: Skin is warm and dry. No rash noted.  Psychiatric: She has a normal mood and affect. Her behavior is normal.    ED Course  Procedures (including critical care time) Labs Review Labs Reviewed  PROTIME-INR - Abnormal; Notable for the following:    Prothrombin Time 24.8 (*)    INR 2.27 (*)    All other components within normal limits  CBC WITH DIFFERENTIAL/PLATELET - Abnormal; Notable for the following:    WBC 32.0 (*)    RBC 3.52 (*)    MCV 112.2 (*)    MCH 39.8 (*)    RDW 17.9 (*)    Platelets 1095 (*)    Neutrophils Relative % 82 (*)    Lymphocytes Relative 9 (*)    Neutro Abs 26.2 (*)    Monocytes Absolute 2.6 (*)    All other components within normal limits  I-STAT CHEM 8, ED - Abnormal; Notable for the following:     Sodium 134 (*)    Potassium 3.4 (*)    Chloride 89 (*)    BUN 26 (*)    Glucose, Bld 161 (*)    Calcium, Ion 1.06 (*)    Hemoglobin 16.3 (*)    HCT 48.0 (*)    All other components within normal limits  CBG MONITORING, ED - Abnormal; Notable for the following:    Glucose-Capillary 163 (*)    All other components within normal limits  ETHANOL  DIFFERENTIAL  URINE RAPID DRUG SCREEN, HOSP PERFORMED  URINALYSIS, ROUTINE W REFLEX MICROSCOPIC (NOT AT [facility])  PATHOLOGIST SMEAR REVIEW  Randolm Idol, ED  Randolm Idol, ED    Imaging Review Ct Head Wo Contrast  01/26/2015   ADDENDUM REPORT: 01/26/2015 13:38  ADDENDUM: Study discussed by telephone with Dr. Roland Rack on 01/26/2015 at 1325 hr.   Electronically Signed   By: Genevie Ann M.D.   On: 01/26/2015 13:38   01/26/2015   CLINICAL DATA:  81 year old female code stroke. Found having seizure and lobby. Diffuse headache. Initial encounter.  EXAM: CT HEAD WITHOUT CONTRAST  TECHNIQUE: Contiguous axial images were obtained from the base of the skull through the vertex without intravenous contrast.  COMPARISON:  None.  FINDINGS: Fluid level and some calcified material in the right maxillary sinus. Other Visualized paranasal sinuses and mastoids are clear. Osteopenia. No acute osseous abnormality identified. No acute orbit or scalp soft tissue findings identified.  Calcified atherosclerosis at the skull base. Cerebral volume is within normal limits for age. Patchy bilateral cerebral white matter hypodensity. Confluent hyperdensity in the right deep gray matter nuclei and nearest the caudate (series 21, image 18). No acute cortically based infarct identified. No suspicious intracranial vascular hyperdensity. No acute intracranial hemorrhage identified. No midline shift, mass effect, or evidence of intracranial mass lesion. No ventriculomegaly. No cortical encephalomalacia identified.  IMPRESSION: 1. Cerebral white matter disease and changes in  the right deep gray matter in nuclei compatible with age indeterminate small vessel ischemia. 2. No acute cortically based infarct or intracranial hemorrhage.  Electronically Signed: By: Genevie Ann M.D. On: 01/26/2015 13:24   I have personally reviewed and evaluated these images and lab results as part of my medical decision-making.   EKG Interpretation None      MDM   Final diagnoses:  Seizure    CRITICAL CARE Performed by: Tanna Furry JOSEPH   Total critical care time: 90 min  Initiation of present bolus and drip. Multiple consultations. I left the care of another patient to assume the care of this patient upon her arrival  Critical care time was exclusive of separately billable procedures and treating other patients.  Critical care was necessary to treat or prevent imminent or life-threatening deterioration.  Critical care was time spent personally by me on the following activities: development of treatment plan with patient and/or surrogate as well as nursing, discussions with consultants, evaluation of patient's response to treatment, examination of patient, obtaining history from patient or surrogate, ordering and performing treatments and interventions, ordering and review of laboratory studies, ordering and review of radiographic studies, pulse oximetry and re-evaluation of patient's condition. Care   Heart rate improves. Care discussed with Dr. Roland Rack. He is inpatient evaluation and consultation. No acute findings on CT.  Admission. Rate control. Continued anticoagulation. We'll require EEG and MRI. No antiepileptics at this time.  Tanna Furry, MD 01/26/15 [phone]

## 2015-01-26 NOTE — ED Notes (Signed)
Pt placed on bedpan. Pt unable to urinate at this time

## 2015-01-26 NOTE — ED Notes (Signed)
Pt family member to call - number [phone]) [address]

## 2015-01-26 NOTE — Progress Notes (Signed)
Patient's Daughter is Proofreader. She lives in [place]. Her number is [phone].

## 2015-01-26 NOTE — ED Notes (Signed)
Activated CODE Stroke

## 2015-01-26 NOTE — ED Notes (Signed)
Pt to ED from [facility] due to witnessed seizure activity by two nurses and a family friend. Pt was here visiting husband when being wheeled out to car began "shaking all over and screaming" per family friend. Pt brought to ED, very lethargic and having difficulty following commands, not answering questions. Pt placed on monitor and HR @ 175, atrial fibrillation. MD Jeneen Rinks notified, EKG given to him. Diltiazem 20 mg bolus followed by infusion starting at 1m/hr ordered. IV accessed obtained. After assessing pt, pt met criteria for Code Stroke activation. Code Stroke activated and pt was taken to CT 1 immediately with stroke team at the bedside. After CT pt still having difficulty answering some questions, pt is aphasic (expressive and receptive). Pt ability to follow commands is improving. Pt has hx of diabetes and atrial fibrillation, which she is on Xarelto for. Pt does not meet criteria for TPA treatment. Will continue to monitor. Pt is alert and oriented to self and place, but not time.

## 2015-01-27 ENCOUNTER — Inpatient Hospital Stay (HOSPITAL_COMMUNITY): Payer: Medicare Other

## 2015-01-27 LAB — CBC
HEMATOCRIT: 38.3 % (ref 36.0–46.0)
Hemoglobin: 13.1 g/dL (ref 12.0–15.0)
MCH: 38.9 pg — AB (ref 26.0–34.0)
MCHC: 34.2 g/dL (ref 30.0–36.0)
MCV: 113.6 fL — AB (ref 78.0–100.0)
PLATELETS: 953 10*3/uL — AB (ref 150–400)
RBC: 3.37 MIL/uL — ABNORMAL LOW (ref 3.87–5.11)
RDW: 17.8 % — AB (ref 11.5–15.5)
WBC: 23.2 10*3/uL — AB (ref 4.0–10.5)

## 2015-01-27 LAB — GLUCOSE, CAPILLARY
GLUCOSE-CAPILLARY: 235 mg/dL — AB (ref 65–99)
GLUCOSE-CAPILLARY: 213 mg/dL — AB (ref 65–99)
GLUCOSE-CAPILLARY: 258 mg/dL — AB (ref 65–99)
GLUCOSE-CAPILLARY: 181 mg/dL — AB (ref 65–99)

## 2015-01-27 LAB — MRSA PCR SCREENING: MRSA by PCR: NEGATIVE

## 2015-01-27 LAB — PATHOLOGIST SMEAR REVIEW

## 2015-01-27 LAB — PHENYTOIN LEVEL, TOTAL: Phenytoin Lvl: <2.5 ug/mL — ABNORMAL LOW (ref 10.0–20.0)

## 2015-01-27 MED ORDER — METOPROLOL TARTRATE 25 MG PO TABS
25.0000 mg | ORAL_TABLET | Freq: Two times a day (BID) | ORAL | Status: DC
  Administered 2015-01-27: 25 mg via ORAL
  Filled 2015-01-27: qty 1

## 2015-01-27 MED ORDER — METOPROLOL TARTRATE 50 MG PO TABS
50.0000 mg | ORAL_TABLET | Freq: Two times a day (BID) | ORAL | Status: DC
  Administered 2015-01-27: 50 mg via ORAL
  Filled 2015-01-27: qty 1

## 2015-01-27 MED ORDER — LORAZEPAM 2 MG/ML IJ SOLN
INTRAMUSCULAR | Status: AC
  Filled 2015-01-27: qty 1

## 2015-01-27 MED ORDER — LORAZEPAM 2 MG/ML IJ SOLN: 2.0000 mg | Freq: Once | INTRAMUSCULAR | Status: AC

## 2015-01-27 MED ORDER — LEVETIRACETAM 500 MG/5ML IV SOLN
1000.0000 mg | Freq: Two times a day (BID) | INTRAVENOUS | Status: DC
  Administered 2015-01-27 – 2015-01-29 (×4): 1000 mg via INTRAVENOUS
  Filled 2015-01-27 (×6): qty 10

## 2015-01-27 MED ORDER — SODIUM CHLORIDE 0.9 % IV SOLN: 1000.0000 mg | Freq: Two times a day (BID) | INTRAVENOUS | Status: DC

## 2015-01-27 MED ORDER — CEPHALEXIN 500 MG PO CAPS
500.0000 mg | ORAL_CAPSULE | Freq: Three times a day (TID) | ORAL | Status: DC
  Administered 2015-01-27: 500 mg via ORAL
  Filled 2015-01-27: qty 1

## 2015-01-27 MED ORDER — CEPHALEXIN 500 MG PO CAPS
500.0000 mg | ORAL_CAPSULE | Freq: Four times a day (QID) | ORAL | Status: AC
  Administered 2015-01-27 – 2015-01-30 (×10): 500 mg via ORAL
  Filled 2015-01-27 (×11): qty 1

## 2015-01-27 MED ORDER — LORAZEPAM 2 MG/ML IJ SOLN
1.0000 mg | Freq: Once | INTRAMUSCULAR | Status: AC
  Administered 2015-01-27: 1 mg via INTRAVENOUS
  Filled 2015-01-27: qty 1

## 2015-01-27 MED ORDER — LORAZEPAM 2 MG/ML IJ SOLN
INTRAMUSCULAR | Status: AC
  Administered 2015-01-27: 2 mg
  Filled 2015-01-27: qty 1

## 2015-01-27 NOTE — Progress Notes (Signed)
Subjective: No further seizures.  patient currently has a HA but states it is a 4/10 and very much like her common HA.   Exam: Filed Vitals:   01/27/15 0820  BP: 97/43  Pulse:   Temp:   Resp: 17    HEENT-  Normocephalic, no lesions, without obvious abnormality.  Normal external eye and conjunctiva.  Normal TM's bilaterally.  Normal auditory canals and external ears. Normal external nose, mucus membranes and septum.  Normal pharynx. Cardiovascular- irregularly irregular rhythm, pulses palpable throughout   Lungs- chest clear, no wheezing, rales, normal symmetric air entry Abdomen- normal findings: bowel sounds normal Extremities- no edema     Gen: In bed, NAD MS: Alert oriented to [facility] hospital and year is 2016.  She is not very talkative but will answer questions if asked.  Names watch and repeats words with no difficulty. Follows commands with no difficulty. No dysarthria or aphasia noted.  CN: PERRLA, EOMI, TML, FACE symmetrical Motor: 5/5 throughout Sensory: intact throughout DTR:2+ in UE and KJ no AJ  Pertinent Labs: WBC 23.2 NA 134 K 3.4 BUN 26 UA (+) leukocytes with UA pending PLTS 953  CXR negative for pna MRI shows no acute stroke EEG Pending  Etta Quill PA-C [facility] [phone]  Impression:  81 yo F with history of seizures and oost-ictal aphasia, though none in 20 years. An EEG is pending.   Recommendations: 1) EEG, further recs following this.    Roland Rack, MD [facility] [phone]  If 7pm- 7am, please page neurology on call as listed in [place].  01/27/2015, 9:01 AM

## 2015-01-27 NOTE — Progress Notes (Signed)
EEG Completed; Results Pending  

## 2015-01-27 NOTE — Progress Notes (Signed)
OT Cancellation Note  Patient Details Name: Theresa Bird MRN: [id number] DOB: [birth year]   Cancelled Treatment:    Reason Eval/Treat Not Completed: Patient not medically ready. Pt on strict bedrest. Please update activity orders when appropriate for OT evaluation.   Benito Mccreedy OTR/L [phone] 01/27/2015, 3:24 PM
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
## 2015-01-27 NOTE — Progress Notes (Signed)
Utilization review completed. Iona Stay, RN, BSN. 

## 2015-01-27 NOTE — Progress Notes (Signed)
EEG concerning for ongoing seizure. She is able to speak and tell me where she is and has some mild word finding difficulty, but otherwise appears very interactive. In this situation, I would not favor aggressive(e.g. Intubation or anesthesia) treatment. I have given ativan with some improvement in EEG. I will load her again with keppra and repeat EEG tomorrow. If there is still activity at that time, likely load with trileptal.   Roland Rack, MD [facility] [phone]  If 7pm- 7am, please page neurology on call as listed in [place].

## 2015-01-27 NOTE — Progress Notes (Signed)
PT Cancellation Note  Patient Details Name: Theresa Bird MRN: [id number] DOB: [birth year]   Cancelled Treatment:    Reason Eval/Treat Not Completed: Patient not medically ready.  Per RN pt is actively having a seizure.  PT will follow up tomorrow.  Thanks,    Barbarann Ehlers. Anderson, Ernest, DPT [phone]   01/27/2015, 4:02 PM

## 2015-01-27 NOTE — Procedures (Signed)
ELECTROENCEPHALOGRAM REPORT  Patient: Theresa Bird       Room #: 5A30 EEG No. ID: [id number] Age: 81 y.o.        Sex: female Referring Physician: Christianne Borrow Report Date:  01/27/2015        Interpreting Physician: Anthony Sar  History: Theresa Bird is an 81 y.o. female history diabetes mellitus, hypertension, atrial fibrillation on Xarelto who was admitted following a change in mental status and generalized seizure. Patient was a phasic postictally and was thought to possibly have an acute stroke. MRI showed no acute abnormality.  Indications for study:  Rule out seizure disorder.  Technique: This is an 18 channel routine scalp EEG performed at the bedside with bipolar and monopolar montages arranged in accordance to the international 10/20 system of electrode placement.   Description: This EEG recording was performed during wakefulness. Patient was noted to be conversant and appropriate with responses. Predominant activity consisted of a background of low to moderate amplitude diffuse mixed delta and theta activity, as well as centrally continuous diffuse moderate to high amplitude sharp wave discharges which were of highest amplitude in the frontal and temporal regions. Considerable superimposed bilateral muscle artifact was also recorded. Photic stimulation was not performed. No clinical seizure activity was observed.  Interpretation: This EEG is abnormal with moderate generalized nonspecific background slowing of cerebral activity with continuous diffuse sharp wave activity as described above, the significance of which is unclear, particularly with patient remaining awake and communicative. Generalized status epilepticus is 1 unlikely but cannot be completely ruled out. Clinical correlation is recommended.   Rush Farmer M.D. [facility] [phone]

## 2015-01-28 ENCOUNTER — Inpatient Hospital Stay (HOSPITAL_COMMUNITY): Payer: Medicare Other

## 2015-01-28 LAB — BASIC METABOLIC PANEL
Anion gap: 8 (ref 5–15)
BUN: 13 mg/dL (ref 6–20)
CHLORIDE: 90 mmol/L — AB (ref 101–111)
CO2: 34 mmol/L — AB (ref 22–32)
CREATININE: 0.85 mg/dL (ref 0.44–1.00)
Calcium: 8.7 mg/dL — ABNORMAL LOW (ref 8.9–10.3)
GFR calc Af Amer: >60 mL/min (ref >60)
GFR calc non Af Amer: >60 mL/min (ref >60)
GLUCOSE: 319 mg/dL — AB (ref 65–99)
Potassium: 3.6 mmol/L (ref 3.5–5.1)
SODIUM: 132 mmol/L — AB (ref 135–145)

## 2015-01-28 LAB — CBC
HEMATOCRIT: 39.1 % (ref 36.0–46.0)
HEMOGLOBIN: 13.1 g/dL (ref 12.0–15.0)
MCH: 38.2 pg — ABNORMAL HIGH (ref 26.0–34.0)
MCHC: 33.5 g/dL (ref 30.0–36.0)
MCV: 114 fL — AB (ref 78.0–100.0)
PLATELETS: 867 10*3/uL — AB (ref 150–400)
RBC: 3.43 MIL/uL — AB (ref 3.87–5.11)
RDW: 17.4 % — ABNORMAL HIGH (ref 11.5–15.5)
WBC: 27.9 10*3/uL — AB (ref 4.0–10.5)

## 2015-01-28 LAB — URINE CULTURE: CULTURE: NO GROWTH

## 2015-01-28 LAB — GLUCOSE, CAPILLARY
GLUCOSE-CAPILLARY: 184 mg/dL — AB (ref 65–99)
GLUCOSE-CAPILLARY: 218 mg/dL — AB (ref 65–99)
Glucose-Capillary: 332 mg/dL — ABNORMAL HIGH (ref 65–99)
Glucose-Capillary: 258 mg/dL — ABNORMAL HIGH (ref 65–99)

## 2015-01-28 MED ORDER — DILTIAZEM HCL 60 MG PO TABS
90.0000 mg | ORAL_TABLET | Freq: Four times a day (QID) | ORAL | Status: DC
  Administered 2015-01-28 – 2015-01-29 (×3): 90 mg via ORAL
  Filled 2015-01-28 (×6): qty 1

## 2015-01-28 MED ORDER — TIOTROPIUM BROMIDE MONOHYDRATE 18 MCG IN CAPS
18.0000 ug | ORAL_CAPSULE | Freq: Every day | RESPIRATORY_TRACT | Status: DC
  Filled 2015-01-28: qty 5

## 2015-01-28 MED ORDER — TIOTROPIUM BROMIDE MONOHYDRATE 18 MCG IN CAPS
18.0000 ug | ORAL_CAPSULE | RESPIRATORY_TRACT | Status: DC
  Administered 2015-01-30: 18 ug via RESPIRATORY_TRACT
  Filled 2015-01-28 (×2): qty 5

## 2015-01-28 MED ORDER — TIOTROPIUM BROMIDE MONOHYDRATE 18 MCG IN CAPS: 18.0000 ug | ORAL_CAPSULE | Freq: Two times a day (BID) | RESPIRATORY_TRACT | Status: DC

## 2015-01-28 MED ORDER — METOPROLOL TARTRATE 50 MG PO TABS
75.0000 mg | ORAL_TABLET | Freq: Two times a day (BID) | ORAL | Status: DC
  Administered 2015-01-28 – 2015-01-29 (×2): 75 mg via ORAL
  Filled 2015-01-28 (×4): qty 1

## 2015-01-28 NOTE — Progress Notes (Signed)
Family Medicine Teaching Service Daily Progress Note Intern Pager: [phone]  Patient name: Theresa Bird Medical record number: [id number] Date of birth: [birth year] Age: 81 y.o. Gender: female  Primary Care Provider: Celedonio Savage, MD Consultants: Neurology Code Status: FULL  Pt Overview and Major Events to Date:  8/22: Patient admitted following apparent seizure, loaded with Keppra. Noted to be in afib w/ RVR, given diltiazem 20 mg IV and started on diltiazem drip with persistent atrial fibrillation. 8/23: EEG with continued seizure activity, received 1 dose Ativan and repeat Keppra loading dose. Persistent afib w/ RVR, continued on diltiazem drip 15 mg/hr and restarted home metoprolol which was titrated up to 50 mg BID with persistent afib.  Assessment and Plan: Theresa Bird is a 81 yo female who presents following witnessed seizure and was found to be in atrial fibrillation with RVR. PMH significant for unspecified seizure disorder, PAF s/p cardioversion x3, essential thrombocytosis, COPD, HTN, HLD, and CAD s/p CABG.  Seizure: Patient with history of seizure disorder and admitted following single seizure event. Following 1 dose of Keppra on day of admission, patient with general improvement but significant word finding difficulties paired with waxing and waning alertness. EEG (8/23) concerning for ongoing seizure activity and patient received Ativan with improvement in EEG activity; subsequently received repeat loading dose of Keppra. Per patient's daughter, patient on Dilantin at home; however, Dilantin level subtherapeutic (<2.5). Seizure activity likely secondary to medication non-adherence. Friends of patient also produced home medications with multiple old pill bottles, further concerning for medication non-compliance. - Per Neuro recommendations, will repeat EEG today with plan to load with trileptal if persistent seizure activity. - F/U Neuro recommendations - Will continue to trend  WBC and platelets with daily CBC  Afib with RVR: Patient s/p bolus diltiazem (8/22) with persistent atrial fibrillation on diltiazem drip throughout the day yesterday. Not sufficiently rate controlled with rates 110s-140s throughout the day. Restarted home metoprolol 25 g PO BID, increased to 50 mg PO BID with rates persistently in 110s-120s overnight. - Increase metoprolol to 75 mg PO BID and continue to titrate up as appropriate; will consider digoxin if unable to control afib with BB and diltiazem dip. - Continue diltiazem drip - Continuous cardiac monitoring - Continue home Xarelto - Holding amiodarone considering concern for respiratory side effects  Altered Mental Status: Since admission, patient with waxing and waning difficulties with attention, memory, orientation, and language. Potential for delirium in this elderly patient with significant medical issues at baseline. Dementia is also a concern in this patient with difficulties with medication compliance who has been living at home with some difficulty in IADLs following the hospitalization of her significant other. AMS may also be secondary to continued status epilepticus, and seizure activity must be controlled prior to further evaluation. - Continue to monitor - OT/PT/SLP as condition improves  COPD: 3-4 year history of COPD on home O2 continuously. Per daughter's report, patient with recent "chest cold" that was treated with steroids, inhaler, and antibiotics; likely COPD exacerbation. Respiratory status stable; however, breath sounds increasingly course and patient endorses SOB and cough productive of yellow sputum different from baseline. - Continue O2 by [place], monitoring O2 sats and clinical status appropriately. - Albuterol PRN  UTI: Patient reports dysuria and UA on admission +leuks. Patient taking Keflex upon admission; per EMR, 10 day course started 8/16. Urine culture now NGTD x24 hours. - Continue Keflex 500 mg PO TID to  complete 10 day course (8/16-8/25)  HTN: Stable, BP tolerating increase  in metoprolol well. - Continue to hold lisinopril - Titrating metoprolol as above  DM: BGs elevated (160s-230s) overnight. - SSI  FEN/GI: Heart healthy PPx: On full dose anticoagulation  Disposition: SDU for diltiazem drip, d/c pending rate control  Subjective:  Patient reports general decline this morning, and states that she "hurts all over," specifically in her back and feet. Continues to endorse mild dysuria. Also reports shortness of breath and development of cough with yellow phlegm that is atypical from her baseline.  Objective: Temp:  [97.7 F (36.5 C)-98 F (36.7 C)] 97.7 F (36.5 C) (08/24 0433) Pulse Rate:  [118-127] 125 (08/24 0433) Resp:  [17-23] 18 (08/24 0433) BP: (97-123)/(43-103) 110/66 mmHg (08/24 0433) SpO2:  [93 %-96 %] 96 % (08/24 0433) Weight:  [152 lb 9.6 oz (69.219 kg)] 152 lb 9.6 oz (69.219 kg) (08/24 0433)  Physical Exam: General: Patient lying back in bed throughout exam with eyes clenched shut. In no acute distress, though visibly distraught. Psych: Alert and oriented to person and place, not oriented to time. Difficulty with attention throughout exam. Occasionally responding to external stimuli with visual hallucinations. Cardiovascular: Irregularly irregular without murmur. Respiratory: Normal work of breathing on 2L [place]. Lungs with breath sounds that are increasingly course and mild expiratory wheezes present bilaterally, more pronounced at the apices. Patient with nonproductive cough during interview. Abdomen: Soft, non-tender and non-distended. Denies suprapubic tenderness. Extremities: Warm and well-perfused without edema or erythema.  Laboratory:  Recent Labs Lab 01/26/15 1248 01/26/15 1318 01/27/15 0726  WBC 32.0*  --  23.2*  HGB 14.0 16.3* 13.1  HCT 39.5 48.0* 38.3  PLT 1095*  --  953*    Recent Labs Lab 01/26/15 1318  NA 134*  K 3.4*  CL 89*  BUN 26*   CREATININE 1.00  GLUCOSE 161*    Phenytoin Level (8/23): <2.5 Urine Culture (8/22): NGTD  Imaging/Diagnostic Tests:  EEG (8/23, 16:55): Description: This EEG recording was performed during wakefulness. Patient was noted to be conversant and appropriate with responses. Predominant activity consisted of a background of low to moderate amplitude diffuse mixed delta and theta activity, as well as centrally continuous diffuse moderate to high amplitude sharp wave discharges which were of highest amplitude in the frontal and temporal regions. Considerable superimposed bilateral muscle artifact was also recorded. Photic stimulation was not performed. No clinical seizure activity was observed.  Interpretation: This EEG is abnormal with moderate generalized nonspecific background slowing of cerebral activity with continuous diffuse sharp wave activity as described above, the significance of which is unclear, particularly with patient remaining awake and communicative. Generalized status epilepticus is 1 unlikely but cannot be completely ruled out. Clinical correlation is recommended.   Orion Crook, Med Student 01/28/2015, 7:08 AM MS4, [place] Intern pager: [phone], text pages welcome  RESIDENT ADDENDUM I have separately seen and examined the patient. I have discussed the findings and exam with the medical student and agree with the above note. Additionally I have outlined my exam and assessment/plan below:  PE: General: NAD HEENT: PERRL EOMI.  CV: tachycardic, regular rhythm, no mrg, no LE edema Resp: coarse breath sounds bilaterally but normal effort Abd: soft, NTND, normal bowel sounds Extremities: No LE edema Neuro: alert, not oriented. Converses and answers some questions. Follows simple commands. Skin: no rashes  A/P: ADARA KITTLE is a 81 y.o. female admitted for AMS/seizure and afib with RVR. PMH significant for CAD s/p CABG, HTN, HLD, T2DM, Essential  thrombocytosis.   # Seizure:  EEG 8/23 concerning for status epilepticus in frontal/temporal regions - appreciate neuro recs - on keppra currently - repeat EEG 8/24  # Afib with RVR: - transition diltiazem gtt to oral (has been on max dose of dilt drip) - increase metoprolol to 75mg  BID - continue cardiac monitoring - continue xarelto  # COPD: Resp status stable despite worsened lung exam 8/24 - continue O2 by [place] - will d/c PRN albuterol and if respiratory status worsens can re-add (holding for possible tachycardia if it is given) - spiriva added today  # HTN: Stable - Continue to hold lisinopril - on metoprolol  # Prior UTI: UA with +leuks and suprapubic tenderness. Urine culture was negative. - continue home Keflex through 8/25 to complete outpatient course  # Expressive aphasia: Likely from remote stroke vs status epilepticus (this symptom appears to be intermittent) - SP cognitive and speech eval recommending SNF due to cognitive impairments - PT/OT c/s for evaluation of possible placement needs - SW consulted  # FEN/GI: - heart healthy diet, SLIV - on full dose anticoagulation  Dispo: pending clinical improvement  Tawanna Sat, MD 01/28/2015, 12:11 PM PGY-3, [place] Intern Pager: [phone], text pages welcome

## 2015-01-28 NOTE — Procedures (Signed)
ELECTROENCEPHALOGRAM REPORT  Patient: Theresa Bird       Room #: 2V95 EEG No. ID: [id number] Age: 81 y.o.        Sex: female Referring Physician: Christianne Borrow Report Date:  01/28/2015        Interpreting Physician: Anthony Sar  History: [name] is an 82 y.o. female with a history diabetes mellitus, hypertension, atrial fibrillation on Xarelto who was admitted following a change in mental status and generalized seizure. Patient was aphasic postictally and was thought to possibly have an acute stroke. MRI showed no acute abnormality. EEG on 01/27/2015 showed diffuse moderate to high amplitude continuous sharp wave activity, in the absence of clinical seizure activity. Patient was given Ativan and also started on Keppra.   Indications for study:  Rule out seizure activity.  Technique: This is an 18 channel routine scalp EEG performed at the bedside with bipolar and monopolar montages arranged in accordance to the international 10/20 system of electrode placement.   Description: This EEG recording was performed during wakefulness. Predominant background activity consisted of low amplitude diffuse mixed delta and theta activity, with intermittent sharp wave discharges recorded from the right and left frontal regions simultaneously. Photic stimulation was not performed.  Interpretation: This EEG abnormal with generalized nonspecific continuous slowing of seizure activity as well as intermittent bifrontal sharp wave discharges, indicative of possible epileptogenic foci. Compared to the previous study on 01/27/2015, background slowing is less pronounced, and there is a marked reduction in amount and distribution of sharp wave activity.   Rush Farmer M.D. [facility] [phone]

## 2015-01-28 NOTE — Progress Notes (Signed)
Inpatient Diabetes Program Recommendations  AACE/ADA: New Consensus Statement on Inpatient Glycemic Control (2013)  Target Ranges:  Prepandial:   less than 140 mg/dL      Peak postprandial:   less than 180 mg/dL (1-2 hours)      Critically ill patients:  140 - 180 mg/dL   Results for CASEY, MAXFIELD (MRN [id number]) as of 01/28/2015 09:09  Ref. Range 01/27/2015 07:40 01/27/2015 11:29 01/27/2015 16:22 01/27/2015 21:45  Glucose-Capillary Latest Ref Range: 65-99 mg/dL 235 (H) 213 (H) 258 (H) 181 (H)    Results for SELMA, MINK (MRN [id number]) as of 01/28/2015 09:09  Ref. Range 01/28/2015 07:54  Glucose-Capillary Latest Ref Range: 65-99 mg/dL 332 (H)    Admit with: Seizures/ Afib w/ RVR  History: DM, COPD, CABG  Home DM Meds: 70/30 insulin- 10 units bidwc  Current DM Orders: Novolog Sensitive SSI (0-9 units) TID AC + HS     -Unsure of present PO Intake.  -Glucose levels elevated.    MD- Please consider starting basal insulin for this patient.  Unsure of PO intake and not sure that restarting 70/30 insulin would be the best option at this point during hospitalization.  Please consider starting Levemir 13 units daily (0.2 units/kg dosing)    Will follow Wyn Quaker RN, MSN, CDE Diabetes Coordinator Inpatient Glycemic Control Team Team Pager: [phone] (8a-5p)

## 2015-01-28 NOTE — Progress Notes (Signed)
Subjective: patient has no complaints.  She is confused and thinks she is her room but quickly acknowledges she is in a hospital room.    Exam: Filed Vitals:   01/28/15 0433  BP: 110/66  Pulse: 125  Temp: 97.7 F (36.5 C)  Resp: 18    HEENT-  Normocephalic, no lesions, without obvious abnormality.  Normal external eye and conjunctiva.  Normal TM's bilaterally.  Normal auditory canals and external ears. Normal external nose, mucus membranes and septum.  Normal pharynx. Cardiovascular- irregularly irregular rhythm, pulses palpable throughout   Lungs- no tachypnea, retractions or cyanosis, Heart exam - S1, S2 normal, no murmur, no gallop, rate regular Abdomen- normal findings: bowel sounds normal Extremities- no edema     Gen: In bed, NAD MS: alert, not oriented to date, year or place. Follows all commands without difficulty and shows no dysarthria or aphasia.  FY:TWKMQK, EOMI, visual fields intact. TML, Face equal and symmetric.  Motor: 5/5 throughout Sensory: intact throughout DTR:2+ in UE and KJ no AJ  Pertinent Labs: NONE  Etta Quill PA-C [facility] [phone]  Impression: 81 yo F presented with focal status epilepticus. Now much improved on EEG. Keppra may be contributing to her continued encephalopathy, but would continue at the current dose for now. She does have a history of similar presentations with previous seizures, though has not had a seizure in some time.    Recommendations: 1) Continue keppra at current dose.    Roland Rack, MD [facility] [phone]  If 7pm- 7am, please page neurology on call as listed in [place].  01/28/2015, 9:11 AM

## 2015-01-28 NOTE — Progress Notes (Signed)
EEG completed; results pending.  L. Daryl Meghana Tullo, R.EEGT.  

## 2015-01-28 NOTE — Evaluation (Signed)
Speech Language Pathology Evaluation Patient Details Name: Theresa Bird MRN: [id number] DOB: [birth year] Today's Date: 01/28/2015 Time: 4097-3532 SLP Time Calculation (min) (ACUTE ONLY): 13 min  Problem List:  Patient Active Problem List   Diagnosis Date Noted  . Seizure 01/26/2015  . Atrial fibrillation with rapid ventricular response 01/26/2015  . Phlebitis   . Screen for STD (sexually transmitted disease)   . Essential thrombocythemia   . Myelofibrosis   . UTI (urinary tract infection) 09/08/2014  . Atrial fibrillation with RVR 09/07/2014  . Cellulitis of left upper arm 09/07/2014  . Paroxysmal atrial fibrillation 04/08/2013  . Essential hypertension 04/08/2013  . CAFL (chronic airflow limitation) 05/11/2012  . Essential hemorrhagic thrombocythemia 05/11/2012  . Clinical depression 07/13/2011  . Diabetes 07/13/2011  . DM2 (diabetes mellitus, type 2) 06/19/2009  . Dyslipidemia 06/19/2009  . CORONARY ATHEROSCLEROSIS NATIVE CORONARY ARTERY 06/19/2009   Past Medical History:  Past Medical History  Diagnosis Date  . Depression   . Essential hypertension, benign   . Hypercholesteremia   . Insulin dependent diabetes mellitus   . 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 Owens Loffler  . Coronary atherosclerosis of native coronary artery     Multivessel status post CABG in [place]  . Atrial fibrillation   . Myelofibrosis    Past Surgical History:  Past Surgical History  Procedure Laterality Date  . Abdominal hysterectomy    . Cataract extraction    . Coronary artery bypass graft      Possibly 2002 in [place]  . Cardioversion N/A 09/09/2014    Procedure: CARDIOVERSION;  Surgeon: Josue Hector, MD;  Location: [facility] ENDOSCOPY;  Service: Cardiovascular;  Laterality: N/A;  . Cardioversion N/A 10/03/2014    Procedure: CARDIOVERSION;  Surgeon: Arnoldo Lenis, MD;  Location: [facility];  Service: Endoscopy;  Laterality: N/A;   HPI:  81 yo female with PMH history  diabetes mellitus, hypertension, atrial fibrillation on Xarelto who was admitted following a change in mental status and generalized seizure. Patient was aphasic postictally and was thought to possibly have an acute stroke. MRI showed no acute abnormality.  Cognitive linguistic eval ordered.     Assessment / Plan / Recommendation Clinical Impression  Pt currently presents with severe cognitive linguistic deficits and moderate dysarthria impairing her ability to participate in her medical care.   Pt is agitated and dysarthria impacts intelligibility.  Impairments in areas of attention, memory, judgement and problem solving noted.  Expressive languge contains adequate grammatical form however confusion present.  Semantic paraphasia and perseveration noted with naming items - stating fountain for watch and alcohol for water despite sentence completion cues.  Pt able to repeat single words only.   Attention and agitation impact her language significantly.    SLP follow up for skilled SLP treatment to maximize pt's rehab potential.  Hopeful for significant cognitive improvement as pt medically improves.   No family present during evaluation.     SLP Assessment  Patient needs continued Speech Lanaguage Pathology Services    Follow Up Recommendations  Skilled Nursing facility    Frequency and Duration min 2x/week  2 weeks   Pertinent Vitals/Pain Pain Assessment: No/denies pain   SLP Goals  Patient/Family Stated Goal: "That girl gave my dad insulin" Potential to Achieve Goals (ACUTE ONLY): Fair Potential Considerations (ACUTE ONLY): Cooperation/participation level;Severity of impairments;Ability to learn/carryover information  SLP Evaluation Prior Functioning  Cognitive/Linguistic Baseline: Information not available (pt states she was independent, cooked her own food and  managed home herself- no family present)   Cognition  Overall Cognitive Status: No family/caregiver present to determine  baseline cognitive functioning Arousal/Alertness: Awake/alert Orientation Level: Oriented to person;Disoriented to place;Disoriented to time;Disoriented to situation Attention: Sustained Sustained Attention: Impaired Memory: Impaired Memory Impairment: Storage deficit;Retrieval deficit;Decreased recall of new information (did not recall she was in the hospital within a few minutes) Awareness: Impaired Awareness Impairment: Intellectual impairment (pt states she can get up independently) Problem Solving: Impaired Problem Solving Impairment: Functional basic (pt has sitter in the room for safety) Behaviors: Restless;Impulsive;Verbal agitation Safety/Judgment: Impaired    Comprehension  Auditory Comprehension Yes/No Questions: Not tested Commands: Impaired One Step Basic Commands: 75-100% accurate Two Step Basic Commands: 25-49% accurate Conversation: Simple Interfering Components: Attention;Processing speed;Working memory EffectiveTechniques: Extra processing time;Repetition;Slowed speech Visual Recognition/Discrimination Discrimination: Not tested Reading Comprehension Reading Status: Impaired Word level: Impaired (reading her correct zip code was difficult but able to id from choice of 2) Sentence Level: Not tested Paragraph Level: Not tested Functional Environmental (signs, name badge): Not tested Interfering Components: Attention;Impulsivity;Eye glasses not available Effective Techniques: Large print    Expression Expression Primary Mode of Expression: Verbal Verbal Expression Overall Verbal Expression: Impaired Initiation: No impairment Level of Generative/Spontaneous Verbalization: Sentence Repetition: Impaired Level of Impairment: Phrase level Naming: Impairment Other Naming Comments: named 2 of 4 items correctly, stated fountain for watch - perseverating even with sentence cue, stated alcohol for water bottle Verbal Errors: Semantic paraphasias;Language of  confusion Pragmatics: Impairment Impairments: Abnormal affect;Eye contact;Topic maintenance;Turn Taking;Dysprosody Interfering Components: Attention   Oral / Motor Oral Motor/Sensory Function Overall Oral Motor/Sensory Function: Appears within functional limits for tasks assessed (no focal cranidal nerve deficits) Motor Speech Overall Motor Speech: Impaired Respiration: Impaired Phonation: Low vocal intensity;Breathy Articulation: Impaired Level of Impairment: Phrase Intelligibility: Intelligibility reduced Word: 75-100% accurate Phrase: 75-100% accurate Sentence: 50-74% accurate Conversation: 50-74% accurate Motor Planning: Witnin functional limits   [place], [facility] SLP [phone]

## 2015-01-28 NOTE — Progress Notes (Signed)
PT Cancellation Note  Patient Details Name: Theresa Bird MRN: [id number] DOB: 12-13-1933   Cancelled Treatment:    Reason Eval/Treat Not Completed: Medical issues which prohibited therapy (Nurse holding PT visit based on medical complications).  EEG and other testing are being conducted and will ask nursing if order can be done tomorrow.   Ramond Dial 01/28/2015, 10:44 AM   Mee Hives, PT MS Acute Rehab Dept. Number: [facility] [id number] and [place] [phone]

## 2015-01-28 NOTE — Progress Notes (Signed)
OT Cancellation Note  Patient Details Name: TONEA LEIPHART MRN: [id number] DOB: [birth year]   Cancelled Treatment:     pt currently with elevated platelets and on Cardizem drip. Ot to hold at this time and discusses with RN Melissa.   Vonita Moss   OTR/L Pager: [phone] Office: [phone] .  01/28/2015, 2:26 PM

## 2015-01-28 NOTE — Progress Notes (Addendum)
Assumed care from off going RN @ [phone]; patient alert & oriented times 3; patient sitting high fowler's in bed; suction set-up for seizures precautions; O2 @ 2L; no c/o's pain at this time; sitter @ bedside;  Patient has no c/o's at this time; daughter at bedside; gave report to on coming RN @ [phone]

## 2015-01-28 NOTE — Progress Notes (Signed)
Patient's HR has been in the 70s-90s most of the evening with some intermittent increases to the 120s with exertion. She has been on a diltiazem drip for >24hrs. Will attempt to transition to orals.  Diltiazem 90mg  q6hrs.  Spoke with RN, after 1 hour of administration, will start titrating down the dilt gtt.  Will continue to monitor for afib with RVR and contact us as needed.  Archie Patten, MD [facility] Family Medicine Resident  01/28/2015, 11:25 PM

## 2015-01-28 NOTE — Progress Notes (Signed)
**  Interval Note**  Dr Juleen China was paged by Gerald Stabs, patient's RN.  I accompanied her to examine patient.  Gerald Stabs reports that patient seems to be more confused and hallucinating.  He also notes that patient's family brought in her medication bottles today, several which were empty.  He is concerned that she may have run out vs taken too much at one time.  On exam, patient is resting in bed, with CNA at bedside.  [place] in place, NAD, normal WOB, patient is not on telemetry at this time as she is just finishing a bath.  She is AOx3, with good hand grip and independently moves all extremities.  No focal deficits appreciated on exam.  Currently, denies visual or auditory hallucination.  She does not appear to be responding to internal stimuli.    Suspect that there is a component of delirium, as her symptoms have been waxing and waning.   -Will ask Pharmacy to investigate meds further for last refill, etc.  Suspect that she likely ran out since Dilantin level was subtherapeutic -Will discuss with day team and continue to monitor -Appreciate Neuro's continued assistance with this patient  Jarrick Fjeld M. Lajuana Ripple, DO PGY-2, [place]

## 2015-01-29 ENCOUNTER — Inpatient Hospital Stay (HOSPITAL_COMMUNITY): Payer: Medicare Other

## 2015-01-29 LAB — GLUCOSE, CAPILLARY
GLUCOSE-CAPILLARY: 236 mg/dL — AB (ref 65–99)
GLUCOSE-CAPILLARY: 245 mg/dL — AB (ref 65–99)
GLUCOSE-CAPILLARY: 214 mg/dL — AB (ref 65–99)
Glucose-Capillary: 293 mg/dL — ABNORMAL HIGH (ref 65–99)

## 2015-01-29 MED ORDER — DILTIAZEM HCL ER 60 MG PO CP12
120.0000 mg | ORAL_CAPSULE | Freq: Two times a day (BID) | ORAL | Status: DC
  Administered 2015-01-29: 120 mg via ORAL
  Filled 2015-01-29 (×5): qty 2

## 2015-01-29 MED ORDER — SODIUM CHLORIDE 0.9 % IV SOLN
500.0000 mg | Freq: Two times a day (BID) | INTRAVENOUS | Status: DC
  Administered 2015-01-29 – 2015-01-30 (×2): 500 mg via INTRAVENOUS
  Filled 2015-01-29 (×3): qty 5

## 2015-01-29 MED ORDER — METOPROLOL TARTRATE 50 MG PO TABS
50.0000 mg | ORAL_TABLET | Freq: Two times a day (BID) | ORAL | Status: DC
  Administered 2015-01-29 – 2015-01-30 (×2): 50 mg via ORAL
  Filled 2015-01-29 (×2): qty 1

## 2015-01-29 NOTE — Progress Notes (Signed)
Subjective: No complaints.    Exam: Filed Vitals:   01/29/15 0540  BP: 101/54  Pulse:   Temp:   Resp: 16    HEENT-  Normocephalic, no lesions, without obvious abnormality.  Normal external eye and conjunctiva.  Normal TM's bilaterally.  Normal auditory canals and external ears. Normal external nose, mucus membranes and septum.  Normal pharynx. Cardiovascular- S1, S2 normal, pulses palpable throughout   Lungs- chest clear, no wheezing, rales, normal symmetric air entry Abdomen- normal findings: bowel sounds normal Extremities- bruising of right hand otherwise dry and intact     Gen: In bed, NAD MS: Alert and oriented to hospital, month, year and president.  Able to follow all commands without problem.  HQ:PRFFMB, EOMI, visual fields intact. TML, Face equal and symmetric.  Motor: 5/5 throughout Sensory: intact throughout DTR:2+ in UE and KJ no AJ   Pertinent Labs:  EEG: Interpretation: This EEG abnormal with generalized nonspecific continuous slowing of seizure activity as well as intermittent bifrontal sharp wave discharges, indicative of possible epileptogenic foci. Compared to the previous study on 01/27/2015, background slowing is less pronounced, and there is a marked reduction in amount and distribution of sharp wave activity  No other new labs  Etta Quill PA-C [facility] [phone]  Impression: 81 yo  F with a history of seizures who presented in focal status epilepticus. Keppra controlled the status, but is making her very sleepy. I am not sure that she needs as high a dose as she is currently on and would favor decreasing this.    Recommendations: 1) Decrease keppra to 500mg  BID 2) continue this medication and follow up as outpatient in 2 - 4 weeks.  3) No further recommendations at this time, please call with any further questions or concerns   Roland Rack, MD [facility] [phone]  If 7pm- 7am, please page neurology  on call as listed in [place]. 01/29/2015, 9:39 AM

## 2015-01-29 NOTE — Progress Notes (Signed)
Cardizem drip now off, HR in the 60s. EKG confirmed still in Atrial Fib.

## 2015-01-29 NOTE — Clinical Social Work Placement (Signed)
   CLINICAL SOCIAL WORK PLACEMENT  NOTE  Date:  01/29/2015  Patient Details  Name: TERIANNA PEGGS MRN: [id number] Date of Birth: 1933/07/24  Clinical Social Work is seeking post-discharge placement for this patient at the [place] level of care (*CSW will initial, date and re-position this form in  chart as items are completed):  Yes   Patient/family provided with [place] Work Department's list of facilities offering this level of care within the geographic area requested by the patient (or if unable, by the patient's family).  Yes   Patient/family informed of their freedom to choose among providers that offer the needed level of care, that participate in Medicare, Medicaid or managed care program needed by the patient, have an available bed and are willing to accept the patient.  Yes   Patient/family informed of [facility]'s ownership interest in [facility] and [facility], as well as of the fact that they are under no obligation to receive care at these facilities.  PASRR submitted to EDS on 01/29/15     PASRR number received on 01/29/15     Existing PASRR number confirmed on       FL2 transmitted to all facilities in geographic area requested by pt/family on 01/29/15     FL2 transmitted to all facilities within larger geographic area on       Patient informed that his/her managed care company has contracts with or will negotiate with certain facilities, including the following:            Patient/family informed of bed offers received.  Patient chooses bed at       Physician recommends and patient chooses bed at      Patient to be transferred to   on  .  Patient to be transferred to facility by       Patient family notified on   of transfer.  Name of family member notified:        PHYSICIAN Please sign FL2     Additional Comment:    _______________________________________________ Berton Mount, [place]

## 2015-01-29 NOTE — Care Management Note (Addendum)
Case Management Note  Patient Details  Name: SIBBIE FLAMMIA MRN: [id number] Date of Birth: 1934-02-22  Subjective/Objective:   81 yo female who presented in focal status epilepticus and was found to be in atrial fibrillation with RVR. PMH significant for unspecified seizure disorder, PAF s/p cardioversion x3, essential thrombocytosis, COPD, HTN, HLD, and CAD s/p CABG. Loaded with Keppra. Noted to be in afib w/ RVR, Initiated on diltiazem 20 mg IV and started on diltiazem drip with persistent atrial fibrillation..                 Action/Plan: PT/OT to evaluate. CM to continue to monitor for disposition needs. CSW has spoken to pt in reference to SNF and pt is agreeable.   Expected Discharge Date:                  Expected Discharge Plan:  Skilled Nursing Facility  In-House Referral:  Clinical Social Work  Discharge planning Services  CM Consult  Post Acute Care Choice:    Choice offered to:     DME Arranged:    DME Agency:     HH Arranged:    [place] Agency:     Status of Service:  In process, will continue to follow  Medicare Important Message Given:  Yes-second notification given Date Medicare IM Given:    Medicare IM give by:    Date Additional Medicare IM Given:    Additional Medicare Important Message give by:     If discussed at [place] of Stay Meetings, dates discussed:    Additional Comments:  Bethena Roys, RN 01/29/2015, 2:29 PM

## 2015-01-29 NOTE — Clinical Social Work Note (Signed)
Clinical Social Work Assessment  Patient Details  Name: Theresa Bird MRN: [id number] Date of Birth: 1933/07/28  Date of referral:  01/29/15               Reason for consult:  Facility Placement                Permission sought to share information with:  Facility Sport and exercise psychologist, Family Supports, Other Permission granted to share information::  Yes, Verbal Permission Granted  Name::      Theresa Bird (daughter); [facility]); Select to coordinate future dc of pt significant other)  Agency::  [place] SNFs for referral purposes  Relationship::     Contact Information:     Housing/Transportation Living arrangements for the past 2 months:  [place] of Information:  Patient, Adult Children Patient Interpreter Needed:  None Criminal Activity/Legal Involvement Pertinent to Current Situation/Hospitalization:  No - Comment as needed Significant Relationships:  Adult Children, Community Support, Significant Other Lives with:  Self Do you feel safe going back to the place where you live?  Yes Need for family participation in patient care:  Yes (Comment) (Pt with intermittent confusion)  Care giving concerns:  CSW made aware of that pt has been confused through hospital stay and family/HCPOA situation is unclear. Also, PT has not been able to assess pt yet but medical team feels pt will likely need SNF for rehab.   Social Worker assessment / plan:  CSW visited pt room and spoke with pt daughter at bedside. Pt daughter informed CSW she does not want to have any conversations away from pt. Per pt daughter, pt has been appropriate and only very mildly confused since she arrived last night. CSW impression is the same based off of discussion CSW had with pt. CSW explained that current thought is that pt will benefit from [facility] rehab at [facility] and concerns for pt home safety alone. Pt immediately refused SNF and stated she would not do this. Pt daughter and CSW spent several minutes talking to  CSW about concerns. CSW explained SNF referral process and pt and pt daughter agreeable to referral being sent to [facility] facilities but pt continued to state that she would still dc home. Pt mentioned to CSW that her significant other Theresa Bird is also in the hospital. CSW asked if pt is aware of what Woody's dc plan will include. Per pt and pt daughter, Woody's daughter has indicated that SNF will likely be necessary when pt is ready for dc. CSW asked pt if she would be more likely to consider SNF if it was a potential option for pt and Woody to be placed at the same facility. Pt did not agree or disagree at this time. Both CSW and pt daughter felt that this was the only instance that pt was not outright refusing SNF. CSW was given permission by pt and pt daughter to reach out to [facility] family as needed to help with facility choice. Pt daughter to continue SNF discussion with pt to see if pt becomes more agreeable. Pt daughter will not place pt without pt permission. Pt does have a HCPOA per pt and pt daughter, a friend named Education officer, environmental. Pt daughter unable to reach [place] while CSW was present but per pt and pt daughter he is kept updated. Pt daughter informed CSW that if pt continues to refuse SNF, pt family/friends will arrange 24 supervision/assistance at home.   Employment status:  Retired Forensic scientist:  Medicare PT Recommendations:  Not  assessed at this time Information / Referral to community resources:  [place]  Patient/Family's Response to care:  Pt unhappy with SNF recommendation at this time. Pt is willing to leave options open but did state multiple time she would likely not change her mind.  Patient/Family's Understanding of and Emotional Response to Diagnosis, Current Treatment, and Prognosis:  Unsure of pt understanding of her condition due to intermittent confusion. Pt daughter does have fair insight into pt condition. Both pt and pt daughter are upset by hospitalization but  appropriate emotional response.  Emotional Assessment Appearance:  Appears stated age, Disheveled Attitude/Demeanor/Rapport:  Other (Cooperative) Affect (typically observed):  Agitated, Frustrated Orientation:  Oriented to Self, Oriented to Place, Oriented to Situation Alcohol / Substance use:  Not Applicable Psych involvement (Current and /or in the community):  No (Comment)  Discharge Needs  Concerns to be addressed:  Discharge Planning Concerns, Home Safety Concerns Readmission within the last 30 days:  No Current discharge risk:  Dependent with Mobility, Cognitively Impaired Barriers to Discharge:  Continued Medical Work up   [employer], [place]
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
## 2015-01-29 NOTE — Progress Notes (Signed)
Family Medicine Teaching Service Daily Progress Note Intern Pager: [phone]  Patient name: Theresa Bird Medical record number: [id number] Date of birth: [birth year] Age: 81 y.o. Gender: female  Primary Care Provider: Celedonio Savage, MD Consultants: Neurology, PT, OT, SLP, CSW Code Status: FULL  Pt Overview and Major Events to Date:  8/22: Patient admitted following apparent seizure, loaded with Keppra. Noted to be in afib w/ RVR, given diltiazem 20 mg IV and started on diltiazem drip with persistent atrial fibrillation. 8/23: EEG with continued seizure activity, received 1 dose Ativan and repeat Keppra loading dose. Persistent afib w/ RVR, continued on diltiazem drip 15 mg/hr and restarted home metoprolol which was titrated up to 50 mg BID with persistent afib. 8:24: EEG with continued seizure activity, though less notable. Atrial fibrillation rate controlled on metoprolol and diltiazem, transitioned to PO diltiazem. Significant improvement in mental status.  Assessment and Plan:  Theresa Bird is a 81 yo female who presented in focal status epilepticus and was found to be in atrial fibrillation with RVR. PMH significant for unspecified seizure disorder, PAF s/p cardioversion x3, essential thrombocytosis, COPD, HTN, HLD, and CAD s/p CABG.  Focal Status Epilepticus: EEG 8/24 with continued seizure activity in frontal and temporal regions, though marked improvement from 8/23. Patient with significant improvement in general status overnight and no longer apparently post-ictal. Apparent sleepiness from Keppra dosing. Regarding mental status, patient with significant improvement in attention, alertness, and cognition overnight. No further AVH, alert and oriented on my exam this morning. - Per Neurology recommendations, will decrease Keppra to 500 mg BID. This will be continued for 2-4 weeks after discharge and she will follow up with Neurology on an outpatient basis. - SLP cognitive and speech  evaluation recommending SNF due to cognitive impairments yesterday; however, patient post-ictal during that time and with significant improvement this morning. - MMSE prior to discharge - PT, OT consult for evaluation of possible placement needs - SW consulted  Atrial Fibrillation with RVR: Patient has remained in afib overnight; however, she has been sufficiently rate controlled on diltiazem drip with 75 mg metoprolol BID. Most recent, HR in the 60s on EKG. Transitioned to diltiazem 90 mg PO q6h. - Plan to decrease metoprolol to 50 mg BID - Will transition from q6h diltiazem to diltiazem 240 mg PO daily for easier dosing. She will continue diltiazem on an outpatient basis. - Continue cardiac monitoring - Continue Xarelto  Right Hand Bruising: Patient with pain and significant bruising of the smallest finger of the right hand. Presumes injury during seizure. - XR R hand  COPD: Respiratory status stable and patient reports general improvement in shortness of breath and productive cough. Received one dose tiotropium yesterday with symptomatic improvement. - Continuous O2 by [place] - Previously held albuterol PRN due to concern for tachycardia. Will restart albuterol PRN today. - Continue tiotropium for symptom control. Consider tiotropium continuation at home for symptomatic control of COPD.  HTN: Normotensive overnight. - Continue to hold lisinopril - Titrating metoprolol as above  DM: BG elevated overnight (180s-300s). - Will transition from [place] to home Novolin 70-30 15 U BID  UTI: Patient with symptomatic improvement and negative urine culture. - Continue Keflex course, with final dose today (8/16-8/25)  FEN/GI: heart healthy diet PPx: full dose anticoagulation  Disposition: Possible discharge tomorrow pending continued afib control and placement evaluation.  Subjective:  Patient reports general improvement in cognition and energy overnight. Endorses diffuse frontal headache present  since yesterday morning, though this has improved. Still  with intermittent cough productive of mild amount of yellow sputum, but feels that this has improved significantly since her recent COPD exacerbation and is improving each day. Denies dysuria.  Objective: Temp:  [97.6 F (36.4 C)-98.4 F (36.9 C)] 97.9 F (36.6 C) (08/25 0443) Pulse Rate:  [44-126] 61 (08/25 0245) Resp:  [16-27] 16 (08/25 0540) BP: (94-119)/(42-82) 101/54 mmHg (08/25 0540) SpO2:  [95 %-98 %] 97 % (08/25 0540) Weight:  [162 lb 11.2 oz (73.8 kg)-163 lb (73.936 kg)] 162 lb 11.2 oz (73.8 kg) (08/25 0540)  Physical Exam: General: Patient sitting comfortably in bed eating. NAD. Cardiovascular: RRR without murmur/rub/gallop. Respiratory: LCTAB. Normal work of breathing on 2L [place]. Abdomen: Soft, non-tender and non-distended. No suprapubic tenderness. Extremities: Warm and well-perfused without edema or erythema. Mild R calf tenderness. Significant bruising of the pinky of the R hand. Neuro: Alert and oriented x3. Speech normal with occasional word errors. Follows commands. No focal neuro deficits appreciated.  Laboratory:  Recent Labs Lab 01/26/15 1248 01/26/15 1318 01/27/15 0726 01/28/15 1012  WBC 32.0*  --  23.2* 27.9*  HGB 14.0 16.3* 13.1 13.1  HCT 39.5 48.0* 38.3 39.1  PLT 1095*  --  953* 867*    Recent Labs Lab 01/26/15 1318 01/28/15 1012  NA 134* 132*  K 3.4* 3.6  CL 89* 90*  CO2  --  34*  BUN 26* 13  CREATININE 1.00 0.85  CALCIUM  --  8.7*  GLUCOSE 161* 319*    Imaging/Diagnostic Tests:  Telemetry: Persistent atrial fibrillation overnight, rates 70s-90s most of the evening with intermittent increases to 120s with exertion.  EEG (8/24, 11:11):  Description: This EEG recording was performed during wakefulness. Predominant background activity consisted of low amplitude diffuse mixed delta and theta activity, with intermittent sharp wave discharges recorded from the right and left frontal regions  simultaneously. Photic stimulation was not performed.  Interpretation: This EEG abnormal with generalized nonspecific continuous slowing of seizure activity as well as intermittent bifrontal sharp wave discharges, indicative of possible epileptogenic foci. Compared to the previous study on 01/27/2015, background slowing is less pronounced, and there is a marked reduction in amount and distribution of sharp wave activity.   Orion Crook, Med Student 01/29/2015, 7:39 AM MS4, [place] Intern pager: [phone], text pages welcome  RESIDENT ADDENDUM I have separately seen and examined the patient. I have discussed the findings and exam with the medical student and agree with the above note. Additionally I have outlined my exam and assessment/plan below:  PE: General: NAD HEENT: PERRL EOMI.  CV: irreg rhythm, no mrg, no LE edema Resp: normal effort, some end exp wheezes Abd: soft, NTND, normal bowel sounds Extremities: No LE edema Neuro: alert and oriented, follows commands and answers questions appropriately. Skin: no rashes  A/P: Theresa Bird is a 81 y.o. female admitted for AMS/seizure and afib with RVR. PMH significant for CAD s/p CABG, HTN, HLD, T2DM, Essential thrombocytosis.   # Seizure: EEG 8/23 concerning for status epilepticus in frontal/temporal regions - appreciate neuro recs - on keppra, concern for some sedation so decreasing dose to 500mg    # Afib with RVR: well controlled during diltiazem transition overnight - decrease diltiazem to 240mg  total daily dose - increase metoprolol to 50mg  BID - continue xarelto  # COPD: improved with spiriva - continue O2 by [place] - spiriva   # HTN: Stable - Continue to hold lisinopril - on metoprolol  # Prior UTI: UA with +leuks and suprapubic tenderness.  Urine culture was negative. - finish keflex course today  # Expressive aphasia: resolved, secondary to status epilepticus  # right hand bruising:  suspect MSK injury from seizure - obtain plain film to eval for fracture  # FEN/GI: - heart healthy diet, SLIV - on full dose anticoagulation  Dispo: likely tomorrow  Tawanna Sat, MD 01/29/2015, 1:32 PM PGY-3, [place] Intern Pager: [phone], text pages welcome

## 2015-01-29 NOTE — Progress Notes (Addendum)
Inpatient Diabetes Program Recommendations  AACE/ADA: New Consensus Statement on Inpatient Glycemic Control (2013)  Target Ranges:  Prepandial:   less than 140 mg/dL      Peak postprandial:   less than 180 mg/dL (1-2 hours)      Critically ill patients:  140 - 180 mg/dL   Results for RAVENNA, LEGORE (MRN [id number]) as of 01/29/2015 11:02  Ref. Range 01/28/2015 07:54 01/28/2015 11:18 01/28/2015 16:21 01/28/2015 20:58  Glucose-Capillary Latest Ref Range: 65-99 mg/dL 332 (H) 258 (H) 184 (H) 218 (H)   Results for AGRIPINA, GUYETTE (MRN [id number]) as of 01/29/2015 11:02  Ref. Range 01/29/2015 07:30  Glucose-Capillary Latest Ref Range: 65-99 mg/dL 293 (H)    Admit with: Seizures/ Afib w/ RVR  History: DM, COPD, CABG  Home DM Meds: 70/30 insulin- 10 units bidwc  Current DM Orders: Novolog Sensitive SSI (0-9 units) TID AC + HS     -Patient ate 50% of breakfast yesterday, refused lunch, and ate 100% of dinner last PM.  -Glucose levels elevated.    MD- Please consider starting basal insulin for this patient. Unsure patient will maintain consistent PO intake and not sure that restarting 70/30 insulin would be the best option at this point during hospitalization.  Please consider starting Levemir 13 units daily (0.2 units/kg dosing)  Can restart 70/30 insulin at time of d/c.    Will follow Wyn Quaker RN, MSN, CDE Diabetes Coordinator Inpatient Glycemic Control Team Team Pager: [phone] (8a-5p)

## 2015-01-29 NOTE — Evaluation (Signed)
Physical Therapy Evaluation Patient Details Name: Theresa Bird MRN: [id number] DOB: 1933-06-21 Today's Date: 01/29/2015   History of Present Illness  81 yo female who presented with seizure and also was found to be in atrial fibrillation with RVR. PMH significant for seizure disorder, PAF s/p cardioversion x3, COPD, HTN, and CAD s/p CABG  Clinical Impression  Pt admitted with above diagnosis and presents to PT with functional limitations due to deficits listed below (See PT problem list). Pt needs skilled PT to maximize independence and safety to allow discharge to ST-SNF.      Follow Up Recommendations SNF    Equipment Recommendations  None recommended by PT    Recommendations for Other Services       Precautions / Restrictions Precautions Precautions: Fall Restrictions Weight Bearing Restrictions: No      Mobility  Bed Mobility Overal bed mobility: Needs Assistance Bed Mobility: Supine to Sit;Sit to Supine     Supine to sit: Min assist Sit to supine: Min assist   General bed mobility comments: Assist to elevate trunk into sitting and assist to bring feet back up into bed.  Transfers Overall transfer level: Needs assistance Equipment used: Rolling walker (2 wheeled) Transfers: Sit to/from Stand Sit to Stand: Min assist         General transfer comment: Assist to bring hips up. Pt pulled up on walker to stand despite verbal cues to push up from bed.  Ambulation/Gait Ambulation/Gait assistance: Min assist Ambulation Distance (Feet): 12 Feet Assistive device: Rolling walker (2 wheeled) Gait Pattern/deviations: Step-through pattern;Decreased step length - right;Decreased step length - left;Shuffle;Trunk flexed   Gait velocity interpretation: Below normal speed for age/gender General Gait Details: Verbal cues to stand upright. Pt fatigues quickly.  Stairs            Wheelchair Mobility    Modified Rankin (Stroke Patients Only)       Balance  Overall balance assessment: Needs assistance Sitting-balance support: No upper extremity supported;Feet supported Sitting balance-Leahy Scale: Good     Standing balance support: Bilateral upper extremity supported Standing balance-Leahy Scale: Poor Standing balance comment: upper extremity support required                             Pertinent Vitals/Pain Pain Assessment: No/denies pain    Home Living Family/patient expects to be discharged to:: Private residence Living Arrangements: Spouse/significant other (boyfriend is currently at [facility])   Type of Home: House Home Access: Stairs to enter   Entrance Stairs-Number of Steps: 1 Home Layout: Able to live on main level with bedroom/bathroom;Two level Home Equipment: Walker - 2 wheels;Cane - single point Additional Comments: Home O2    Prior Function Level of Independence: Independent               Hand Dominance        Extremity/Trunk Assessment   Upper Extremity Assessment: Defer to OT evaluation           Lower Extremity Assessment: Generalized weakness         Communication   Communication: No difficulties  Cognition Arousal/Alertness: Awake/alert (Earlier was asleep but now awake) Behavior During Therapy: WFL for tasks assessed/performed Overall Cognitive Status: Impaired/Different from baseline Area of Impairment: Memory     Memory: Decreased short-term memory              General Comments      Exercises  Assessment/Plan    PT Assessment Patient needs continued PT services  PT Diagnosis Difficulty walking;Generalized weakness   PT Problem List Decreased strength;Decreased activity tolerance;Decreased balance;Decreased mobility;Decreased knowledge of use of DME  PT Treatment Interventions DME instruction;Gait training;Functional mobility training;Therapeutic activities;Therapeutic exercise;Balance training;Patient/family education   PT Goals  (Current goals can be found in the Care Plan section) Acute Rehab PT Goals Patient Stated Goal: return home PT Goal Formulation: With patient Time For Goal Achievement: 02/12/15 Potential to Achieve Goals: Good    Frequency Min 3X/week   Barriers to discharge        Co-evaluation               End of Session Equipment Utilized During Treatment: Gait belt Activity Tolerance: Patient limited by fatigue Patient left: in bed;with call bell/phone within reach;with bed alarm set;with family/visitor present Nurse Communication: Mobility status         Time: 8676-1950 PT Time Calculation (min) (ACUTE ONLY): 20 min   Charges:   PT Evaluation $Initial PT Evaluation Tier I: 1 Procedure     PT G Codes:        Tashea Othman February 09, 2015, 4:01 PM  [facility] PT [phone]

## 2015-01-29 NOTE — Care Management Important Message (Signed)
Important Message  Patient Details  Name: Theresa Bird MRN: [id number] Date of Birth: Apr 30, 1934   Medicare Important Message Given:  Yes-second notification given    Pricilla Handler 01/29/2015, 12:13 PM

## 2015-01-29 NOTE — Progress Notes (Signed)
CSW (Clinical Education officer, museum) visited pt room and provided pt with bed offers. Pt is now agreeable to dc to [facility] when medically stable.  [place], [place]

## 2015-01-30 LAB — GLUCOSE, CAPILLARY
GLUCOSE-CAPILLARY: 263 mg/dL — AB (ref 65–99)
Glucose-Capillary: 242 mg/dL — ABNORMAL HIGH (ref 65–99)

## 2015-01-30 MED ORDER — BACITRACIN-NEOMYCIN-POLYMYXIN OINTMENT TUBE: 1.0000 "application " | TOPICAL_OINTMENT | Freq: Every day | CUTANEOUS | Status: DC

## 2015-01-30 MED ORDER — BACITRACIN-NEOMYCIN-POLYMYXIN OINTMENT TUBE
TOPICAL_OINTMENT | Freq: Every day | CUTANEOUS | Status: DC
  Filled 2015-01-30: qty 15

## 2015-01-30 MED ORDER — DILTIAZEM HCL ER COATED BEADS 240 MG PO CP24: 240.0000 mg | ORAL_CAPSULE | Freq: Every day | ORAL | Status: DC

## 2015-01-30 MED ORDER — ATORVASTATIN CALCIUM 40 MG PO TABS
40.0000 mg | ORAL_TABLET | Freq: Every day | ORAL | Status: DC
Start: 2015-01-30 — End: 2015-09-14

## 2015-01-30 MED ORDER — DILTIAZEM HCL ER COATED BEADS 240 MG PO CP24
240.0000 mg | ORAL_CAPSULE | Freq: Every day | ORAL | Status: DC
  Administered 2015-01-30: 240 mg via ORAL
  Filled 2015-01-30: qty 1

## 2015-01-30 MED ORDER — TIOTROPIUM BROMIDE MONOHYDRATE 18 MCG IN CAPS: 18.0000 ug | ORAL_CAPSULE | RESPIRATORY_TRACT | Status: DC

## 2015-01-30 MED ORDER — METOPROLOL TARTRATE 50 MG PO TABS: 50.0000 mg | ORAL_TABLET | Freq: Two times a day (BID) | ORAL | Status: DC

## 2015-01-30 MED ORDER — DOUBLE ANTIBIOTIC 500-10000 UNIT/GM EX OINT
TOPICAL_OINTMENT | Freq: Two times a day (BID) | CUTANEOUS | Status: DC
  Filled 2015-01-30 (×18): qty 1

## 2015-01-30 NOTE — Progress Notes (Signed)
OT Cancellation Note  Patient Details Name: Theresa Bird MRN: [id number] DOB: 1933-07-18   Cancelled Treatment:    Reason Eval/Treat Not Completed: Other (comment) Pt is Medicare/Medicaid and current D/C plan is SNF. No apparent immediate acute care OT needs, therefore will defer OT to SNF. If OT eval is needed please call Acute Rehab Dept. at [phone] or text page OT at [phone].  [place], OTR/L  [phone] 01/30/2015 01/30/2015, 10:09 AM

## 2015-01-30 NOTE — Progress Notes (Signed)
Family Medicine Teaching Service Daily Progress Note Intern Pager: [phone]  Patient name: Theresa Bird Medical record number: [id number] Date of birth: [birth year] Age: 81 y.o. Gender: female  Primary Care Provider: Celedonio Savage, MD Consultants: Neurology, PT, OT, SLP, CSW Code Status: FULL  Pt Overview and Major Events to Date:  8/22: Patient admitted following apparent seizure, loaded with Keppra. Noted to be in afib w/ RVR, given diltiazem 20 mg IV and started on diltiazem drip with persistent atrial fibrillation. 8/23: EEG with continued seizure activity, received 1 dose Ativan and repeat Keppra loading dose. Persistent afib w/ RVR, continued on diltiazem drip 15 mg/hr and restarted home metoprolol which was titrated up to 50 mg BID with persistent afib. 8:24: EEG with continued seizure activity, though less notable. Atrial fibrillation rate controlled on metoprolol and diltiazem, transitioned to PO diltiazem. Significant improvement in mental status. 8/25: PT, SLP recommending SNF placement for deconditioning. R fifth phalanx with small angulated fracture, buddy taped.  Assessment and Plan:  Theresa Bird is a 81 yo female who presented in focal status epilepticus and was found to be in atrial fibrillation with RVR. PMH significant for unspecified seizure disorder, PAF s/p cardioversion x3, essential thrombocytosis, COPD, HTN, HLD, and CAD s/p CABG.  Focal Status Epilepticus: Patient with continued improvement in cognition, attention, and alertness and without further auditory or visual hallucinations. Some improvement in sedation after decrease in Keppra dose yesterday, though with continued fatigue. MMSE 30/30. SLP and PT recommend temporary SNF placement, as patient has experienced some deconditioning during this hospitalization. - Keppra 500 mg BID will be continued for 2-4 weeks after discharge; she will follow up with Neurology on an outpatient basis. - SW consulted and assisting  with patient placement. Appreciate their assistance in care of this patient.  Atrial Fibrillation with RVR: Patient remains in persistent atrial fibrillation, sufficiently rate-controlled overnight on 50 mg metoprolol BID and diltiazem 120 mg q12h. - Will continue metoprolol 50 mg BID following discharge - Will transition from q12h diltiazem to diltiazem 240 mg PO daily for easier dosing. She will continue diltiazem on an outpatient basis. - Continue cardiac monitoring - Continue Xarelto  Right Hand Bruising: Patient with fracture of the fifth proximal phalanx following seizure event. - Buddy taping at present, will continue on outpatient basis - Will follow up with [facility] in 1-2 weeks  R Foot Wound: Patient with wound at the edge of the nailbed of her R great toe which she reports has been present for >1 month and failing to heal. Also reports Keflex was for treatment of this wound, though she has reportedly previously that this was for her UTI. - Consult Wound Care prior to discharge  COPD: Respiratory status stable. Improvement in shortness of breath and productive cough on tiotropium yesterday. Not currently on inhalers on outpatient basis. - Continuous O2 by [place] - PRN albuterol - Continue tiotropium for symptom control. Would consider tiotropium continuation at home for symptomatic control of COPD.  HTN: Normotensive with occasional borderline low BPs overnight. - Continue to hold lisinopril, considering borderline low BPs - Titrating metoprolol as above  DM: BG elevated overnight (200s). - Will transition from [place] to home Novolin 70-30 15 U BID at discharge  UTI: Patient with symptomatic improvement and negative urine culture. Now s/p 10 day course Keflex (8/16-8/25)  FEN/GI: Heart healthy diet PPx: Lovenox  Disposition: Discharge to SNF pending placement.  Subjective:  Patient with general improvement overnight, though reports continued fatigue  and sedation. Hesitant for transfer to SNF, but understands reasons for placement. Of note, she also reports a wound on her R great toe present for >1 month, and now reports for the first time that Keflex on admission was for wound rather than UTI. Improvement in respiratory status on tiotropium inhaler yesterday.  Objective: Temp:  [97.8 F (36.6 C)-98.6 F (37 C)] 97.9 F (36.6 C) (08/26 0437) Pulse Rate:  [89-116] 89 (08/26 0437) Resp:  [17-22] 18 (08/26 0437) BP: (88-100)/(48-67) 100/63 mmHg (08/26 0437) SpO2:  [96 %-100 %] 98 % (08/26 0437) Weight:  [164 lb (74.39 kg)] 164 lb (74.39 kg) (08/26 0437)  Physical Exam: General: Patient sitting up in bed eating breakfast. NAD. Cardiovascular: RRR without murmur/rub/gallop. Respiratory: LCTAB. Normal work of breathing on [place]. Abdomen: Soft, non-tender and non-distended. No suprapubic tenderness. Extremities: Warm and well-perfused without edema or erythema. Of note, patient with purple discoloration of R great toenail. Superficial wound at lateral edge of the R great toenail that is moderately pustular. Neurological: Alert and oriented x3. No focal neuro deficits appreciated. Speech normal and without word errors. Follows commands.  Laboratory:  Recent Labs Lab 01/26/15 1248 01/26/15 1318 01/27/15 0726 01/28/15 1012  WBC 32.0*  --  23.2* 27.9*  HGB 14.0 16.3* 13.1 13.1  HCT 39.5 48.0* 38.3 39.1  PLT 1095*  --  953* 867*    Recent Labs Lab 01/26/15 1318 01/28/15 1012  NA 134* 132*  K 3.4* 3.6  CL 89* 90*  CO2  --  34*  BUN 26* 13  CREATININE 1.00 0.85  CALCIUM  --  8.7*  GLUCOSE 161* 319*    Orion Crook, Med Student 01/30/2015, 7:23 AM MS4, [place] Intern pager: [phone], text pages welcome   RESIDENT ADDENDUM  I have separately seen and examined the patient. I have discussed the findings and exam with the medical student and agree with the above note, which I have edited appropriately. I  helped develop the management plan that is described in the student's note, and I agree with the content.  Additionally I have outlined my exam and assessment/plan below:   PE:  General: Patient laying in bed. NAD. Cardiovascular: RRR, no murmur/rub/gallop. Respiratory: CTAB. Normal WOB. Abdomen: Soft, NTND, +BS Extremities: WWP, no edema. Discoloration of R great toenail. Superficial wound at medial edge of the R great toenail that is moderately pustular. Neurological: Alert and oriented x3. No focal neuro deficits appreciated. Speech normal. Follows commands.  A/P:  Theresa Bird is a 82 y.o. female admitted for AMS/seizure and afib with RVR. PMH significant for CAD s/p CABG, HTN, HLD, T2DM, Essential thrombocytosis.   # Seizure: EEG 8/23 concerning for status epilepticus in frontal/temporal regions - appreciate neuro recs - on keppra 500mg  BID   # Afib with RVR: Rate controlled - Continue diltiazem to 240mg  total daily dose and metoprolol 50mg  BID - continue xarelto  # COPD: improved with spiriva - continue O2 by [place] - spiriva   # HTN: Stable - Continue to hold lisinopril - on metoprolol  # Prior UTI: UA with +leuks and suprapubic tenderness. Urine culture was negative. - finish keflex course 8/25  # Expressive aphasia: resolved, secondary to status epilepticus  # right 5th phalange fracture: suspect injury from seizure - Buddy taped - f/u with Ortho OP  # FEN/GI: - heart healthy diet, SLIV - on full dose anticoagulation  Dispo: SNF later today   Virginia Crews, MD PGY-2,  [facility] 01/30/2015  9:50 AM

## 2015-01-30 NOTE — Consult Note (Signed)
WOC wound consult note Reason for Consult: toe wound Pt reports she had "toe nail fungus" and her doctor gave her antibiotic and told her "after a week it will be all better" Wound type: Right great toe onychomycosis, with some loosening of the toenail and associated paronychia at the right side of the toe nail. The area is painful to the touch Wound RJJ:OACZYS with some maceration of the tissue Drainage (amount, consistency, odor) minimal, not purulent, however some odor Periwound: mild erythema  Dressing procedure/placement/frequency: Antibiotic ointment for now and dry dressings.  Will need follow up with podiatry at DC.  Discussed POC with patient  Re consult if needed, will not follow at this time. Thanks  Kenry Daubert [place], [place] [phone])

## 2015-01-30 NOTE — Progress Notes (Signed)
Discharged to skilled to facility via stretcher per EMS, condition stable, education and discharge instructions complete. Edward Qualia RN

## 2015-01-30 NOTE — Clinical Social Work Placement (Signed)
   CLINICAL SOCIAL WORK PLACEMENT  NOTE  Date:  01/30/2015  Patient Details  Name: Theresa Bird MRN: [id number] Date of Birth: July 06, 1933  Clinical Social Work is seeking post-discharge placement for this patient at the [place] level of care (*CSW will initial, date and re-position this form in  chart as items are completed):  Yes   Patient/family provided with [place] Work Department's list of facilities offering this level of care within the geographic area requested by the patient (or if unable, by the patient's family).  Yes   Patient/family informed of their freedom to choose among providers that offer the needed level of care, that participate in Medicare, Medicaid or managed care program needed by the patient, have an available bed and are willing to accept the patient.  Yes   Patient/family informed of [facility]'s ownership interest in [facility] and [facility], as well as of the fact that they are under no obligation to receive care at these facilities.  PASRR submitted to EDS on 01/29/15     PASRR number received on 01/29/15     Existing PASRR number confirmed on       FL2 transmitted to all facilities in geographic area requested by pt/family on 01/29/15     FL2 transmitted to all facilities within larger geographic area on       Patient informed that his/her managed care company has contracts with or will negotiate with certain facilities, including the following:        Yes   Patient/family informed of bed offers received.  Patient chooses bed at [facility]     Physician recommends and patient chooses bed at      Patient to be transferred to [facility] on 01/30/15.  Patient to be transferred to facility by [facility]      Patient family notified on 01/30/15 of transfer.  Name of family member notified:  Darrick Penna, daughter and the pt      PHYSICIAN Please sign FL2     Additional Comment:     _______________________________________________ Greta Doom, LCSW 01/30/2015, 1:04 PM

## 2015-01-30 NOTE — Discharge Instructions (Signed)
WE have made some medication changes.  You are now taking Keppra 500mg  twice daily to control seizures. You will need to see a neurologist after leaving the hospital.  You are also taking Diltiazem 240mg  daily and Metoprolol 50mg  twice daily. You should also stop taking your lisinopril.  You should see an orthopedist after leaving the hospital about your finger fracture.

## 2015-01-30 NOTE — Care Management Important Message (Signed)
Important Message  Patient Details  Name: Theresa Bird MRN: [id number] Date of Birth: [birth year]   Medicare Important Message Given:  Yes-second notification given    Delrae Sawyers, RN 01/30/2015, 11:10 AM

## 2015-02-10 ENCOUNTER — Ambulatory Visit: Payer: Self-pay | Admitting: Neurology

## 2015-02-24 ENCOUNTER — Ambulatory Visit: Payer: Self-pay | Admitting: Neurology

## 2015-03-03 ENCOUNTER — Ambulatory Visit: Payer: Self-pay | Admitting: Neurology

## 2015-03-03 ENCOUNTER — Telehealth: Payer: Self-pay | Admitting: *Deleted

## 2015-03-03 NOTE — Telephone Encounter (Signed)
No showed new patient appointment. 

## 2015-03-04 ENCOUNTER — Encounter: Payer: Self-pay | Admitting: Neurology

## 2015-07-16 ENCOUNTER — Encounter: Payer: Self-pay | Admitting: Cardiology

## 2015-07-16 ENCOUNTER — Ambulatory Visit (INDEPENDENT_AMBULATORY_CARE_PROVIDER_SITE_OTHER): Payer: Medicare Other | Admitting: Cardiology

## 2015-07-16 ENCOUNTER — Encounter: Payer: Self-pay | Admitting: *Deleted

## 2015-07-16 VITALS — BP 119/75 | HR 85 | Ht 69.0 in | Wt 151.2 lb

## 2015-07-16 DIAGNOSIS — I481 Persistent atrial fibrillation: Secondary | ICD-10-CM

## 2015-07-16 DIAGNOSIS — I2510 Atherosclerotic heart disease of native coronary artery without angina pectoris: Secondary | ICD-10-CM

## 2015-07-16 DIAGNOSIS — I4819 Other persistent atrial fibrillation: Secondary | ICD-10-CM

## 2015-07-16 DIAGNOSIS — I10 Essential (primary) hypertension: Secondary | ICD-10-CM | POA: Diagnosis not present

## 2015-07-16 NOTE — Progress Notes (Signed)
Cardiology Office Note  Date: 07/16/2015   ID: Theresa Bird, DOB 19-Mar-1934, MRN [id number]  PCP: Celedonio Savage, MD  Primary Cardiologist: Rozann Lesches, MD   Chief Complaint  Patient presents with  . Atrial Fibrillation  . Coronary Artery Disease    History of Present Illness: Theresa Bird is an 82 y.o. female last seen in June 2016. She is here today with her daughter for a follow-up visit. Record review finds hospitalization in August 2016 after seizure with status epilepticus. She was evaluated by neurology and antiepileptic medications were adjusted. She was also noted to be back in atrial fibrillation at that time and managed with strategy of heart rate control. She has had a general health decline over the last few months following the death of her husband.  She is in a wheelchair today, states that she does not feel any chest pain or palpitations, and is pleased with her current cardiac regimen. She is on Cardizem CD, Lopressor, and Xarelto. Reports no bleeding problems. She just had recent lab work with Dr. Wenda Overland by report.  Blood pressure and heart rate are well controlled today. We discussed continuing a plan for heart rate control and anticoagulation, no further pursuit of cardioversion unless absolutely indicated. There is no clear indication for antiarrhythmic therapy at this time.  Past Medical History  Diagnosis Date  . Depression   . Essential hypertension, benign   . Hypercholesteremia   . Insulin dependent diabetes mellitus ([place])   . Essential thrombocytosis (HCC)     JAK2 negative - followed by Dr. Owens Loffler  . Coronary atherosclerosis of native coronary artery     Multivessel status post CABG in [place]  . Atrial fibrillation ([place])   . Myelofibrosis ([place])     Current Outpatient Prescriptions  Medication Sig Dispense Refill  . albuterol (PROAIR HFA) 108 (90 BASE) MCG/ACT inhaler Inhale 2 puffs into the lungs every 6 (six) hours as needed for wheezing or  shortness of breath.    Marland Kitchen atorvastatin (LIPITOR) 40 MG tablet Take 1 tablet (40 mg total) by mouth daily at 6 PM. 30 tablet 0  . diltiazem (CARDIZEM CD) 240 MG 24 hr capsule Take 1 capsule (240 mg total) by mouth daily. 30 capsule 0  . hydroxyurea (HYDREA) 500 MG capsule Take 500 mg by mouth 2 (two) times daily.    . insulin NPH-regular Human (NOVOLIN 70/30) (70-30) 100 UNIT/ML injection Inject 10 Units into the skin 2 (two) times daily with a meal.    . metoprolol tartrate (LOPRESSOR) 50 MG tablet Take 1 tablet (50 mg total) by mouth 2 (two) times daily. 60 tablet 0  . neomycin-bacitracin-polymyxin (NEOSPORIN) OINT Apply 1 application topically daily. 30 g 0  . OXYGEN Inhale 2 L into the lungs.    . rivaroxaban (XARELTO) 20 MG TABS tablet Take 1 tablet (20 mg total) by mouth daily after supper. 30 tablet 6  . sertraline (ZOLOFT) 25 MG tablet Take 50 mg by mouth daily.    Marland Kitchen tiotropium (SPIRIVA) 18 MCG inhalation capsule Place 1 capsule (18 mcg total) into inhaler and inhale daily. 30 capsule 0   No current facility-administered medications for this visit.   Allergies:  Review of patient's allergies indicates no known allergies.   Social History: The patient  reports that she has never smoked. She has never used smokeless tobacco. She reports that she drinks alcohol. She reports that she does not use illicit drugs.   ROS:  Please see the history  of present illness. Otherwise, complete review of systems is positive for chronic fatigue, difficulty ambulating, bedsores.  All other systems are reviewed and negative.   Physical Exam: VS:  BP 119/75 mmHg  Pulse 85  Ht 5\' 9"  (1.753 m)  Wt 151 lb 3.2 oz (68.584 kg)  BMI 22.32 kg/m2  SpO2 94%, BMI Body mass index is 22.32 kg/(m^2).  Wt Readings from Last 3 Encounters:  07/16/15 151 lb 3.2 oz (68.584 kg)  01/30/15 164 lb (74.39 kg)  11/05/14 166 lb 1.9 oz (75.352 kg)    Chronically ill-appearing elderly woman in no distress, sitting in  wheelchair. HEENT: Conjunctiva and lids normal, oropharynx clear.  Neck: Supple, no elevated JVP or carotid bruits, no thyromegaly.  Lungs: Diminished breath sounds, clear, nonlabored breathing at rest.  Cardiac: Irregular, no S3 or significant systolic murmur, no pericardial rub.  Abdomen: Soft, nontender, bowel sounds present.  Extremities: No pitting edema, distal pulses 1-2+.   ECG: I personally reviewed her tracing from 02/01/2015 which shows atrial flutter with 4:1 block.  Recent Labwork: 09/07/2014: B Natriuretic Peptide 452.5* 10/14/2014: ALT 15; AST 22; TSH 1.906 01/28/2015: BUN 13; Creatinine, Ser 0.85; Hemoglobin 13.1; Platelets 867*; Potassium 3.6; Sodium 132*     Component Value Date/Time   CHOL 145 09/09/2014 1350   TRIG 79 09/09/2014 1350   HDL 29* 09/09/2014 1350   CHOLHDL 5.0 09/09/2014 1350   VLDL 16 09/09/2014 1350   LDLCALC 100* 09/09/2014 1350    Other Studies Reviewed Today:  Echocardiogram 02/27/2014 [facility]): Mild LVH with LVEF 123456, diastolic dysfunction, mild left atrial enlargement, sclerotic aortic valve without stenosis, MAC with mild mitral regurgitation, RVSP 36 mmHg.  Assessment and Plan:  1. Persistent atrial fibrillation/flutter. She is not overly symptomatic at this point and plan will be to continue strategy of heart rate control and anticoagulation. We are requesting records from Dr. Wenda Overland to clarify her recent lab work. She does not report any bleeding problems.  2. Essential hypertension, blood pressure is well controlled today.  3. CAD status post CABG. No active angina symptoms.  Current medicines were reviewed with the patient today.  Disposition: FU with me in 6 months.   Signed, Satira Sark, MD, [facility] 07/16/2015 2:38 PM    [facility] [place], [place], [place] [address] Phone: [phone]; Fax: [phone]

## 2015-07-16 NOTE — Patient Instructions (Signed)
Your physician recommends that you continue on your current medications as directed. Please refer to the Current Medication list given to you today. Your physician recommends that you schedule a follow-up appointment in: 6 months. You will receive a reminder letter in the mail in about 4 months reminding you to call and schedule your appointment. If you don't receive this letter, please contact our office. 

## 2015-07-17 ENCOUNTER — Telehealth: Payer: Self-pay | Admitting: *Deleted

## 2015-07-17 NOTE — Telephone Encounter (Signed)
Pt dropped off medication list. Updated list and will forward to provider FYI

## 2015-07-17 NOTE — Telephone Encounter (Signed)
Pt voiced understanding to stop amiodarone. Will forward to Dr. Irene Shipper office as well.

## 2015-07-17 NOTE — Telephone Encounter (Signed)
When did she get on amiodarone?  This was not on her discharge summary list, and in fact amiodarone was listed as a medicine to be stopped.  Needs to be clarified. Since we are going to treat her with a strategy of heart rate control, I would prefer to get her off amiodarone and focus on Cardizem and metoprolol if we can. Things may need to be titrated.

## 2015-07-17 NOTE — Telephone Encounter (Signed)
Spoke with pharm at [facility] who verified Dr. Irene Shipper name was on Amiodarone 400 mg bid. LM with Tammy at Dr. Irene Shipper office to verify when pt starting taking medication.

## 2015-07-17 NOTE — Telephone Encounter (Signed)
Stop amiodarone. Continue current doses of Cardizem CD and metoprolol. If heart rate increases, we made need to adjust these doses.

## 2015-07-17 NOTE — Telephone Encounter (Signed)
Verified with Tammy ( Dr. Irene Shipper nurse) that pt started amiodarone 03/2014. 01/2015 d/c summary instructed pt to stop amiodarone, but Dr. Irene Shipper office has been refilling this.

## 2015-07-24 ENCOUNTER — Telehealth: Payer: Self-pay | Admitting: Cardiology

## 2015-07-24 NOTE — Telephone Encounter (Signed)
I spoke with Dr. Ronne Binning directly.

## 2015-07-24 NOTE — Telephone Encounter (Signed)
Dr. Ronne Binning called for Dr. Domenic Polite regarding mutual patient that is inpatient at [facility].  Please call Dr. Ronne Binning [phone].

## 2015-08-10 ENCOUNTER — Encounter: Payer: Self-pay | Admitting: Surgery

## 2015-08-11 ENCOUNTER — Other Ambulatory Visit: Payer: Self-pay

## 2015-08-11 DIAGNOSIS — L97909 Non-pressure chronic ulcer of unspecified part of unspecified lower leg with unspecified severity: Principal | ICD-10-CM

## 2015-08-11 DIAGNOSIS — I83009 Varicose veins of unspecified lower extremity with ulcer of unspecified site: Secondary | ICD-10-CM

## 2015-08-17 ENCOUNTER — Other Ambulatory Visit: Payer: Self-pay

## 2015-08-17 ENCOUNTER — Ambulatory Visit (INDEPENDENT_AMBULATORY_CARE_PROVIDER_SITE_OTHER): Payer: Medicare Other | Admitting: Surgery

## 2015-08-17 ENCOUNTER — Encounter: Payer: Self-pay | Admitting: Surgery

## 2015-08-17 ENCOUNTER — Ambulatory Visit (HOSPITAL_COMMUNITY)
Admission: RE | Admit: 2015-08-17 | Discharge: 2015-08-17 | Disposition: A | Discharge status: Home / Self Care | Payer: No Typology Code available for payment source | Source: Ambulatory Visit | Attending: Surgery | Admitting: Surgery

## 2015-08-17 VITALS — BP 100/60 | HR 99 | Ht 69.0 in | Wt 155.0 lb

## 2015-08-17 DIAGNOSIS — I70234 Atherosclerosis of native arteries of right leg with ulceration of heel and midfoot: Secondary | ICD-10-CM

## 2015-08-17 DIAGNOSIS — R0989 Other specified symptoms and signs involving the circulatory and respiratory systems: Secondary | ICD-10-CM | POA: Diagnosis present

## 2015-08-17 DIAGNOSIS — I10 Essential (primary) hypertension: Secondary | ICD-10-CM | POA: Insufficient documentation

## 2015-08-17 DIAGNOSIS — I771 Stricture of artery: Secondary | ICD-10-CM | POA: Diagnosis not present

## 2015-08-17 DIAGNOSIS — E7800 Pure hypercholesterolemia, unspecified: Secondary | ICD-10-CM | POA: Diagnosis not present

## 2015-08-17 DIAGNOSIS — E119 Type 2 diabetes mellitus without complications: Secondary | ICD-10-CM | POA: Insufficient documentation

## 2015-08-17 DIAGNOSIS — I2510 Atherosclerotic heart disease of native coronary artery without angina pectoris: Secondary | ICD-10-CM | POA: Diagnosis not present

## 2015-08-17 DIAGNOSIS — I83009 Varicose veins of unspecified lower extremity with ulcer of unspecified site: Secondary | ICD-10-CM | POA: Diagnosis not present

## 2015-08-17 DIAGNOSIS — L97909 Non-pressure chronic ulcer of unspecified part of unspecified lower leg with unspecified severity: Secondary | ICD-10-CM

## 2015-08-17 NOTE — Progress Notes (Signed)
Patient name: Theresa Bird MRN: [id number] DOB: 1933/10/14 Sex: female   Referred by: Dr. Nils Pyle  Reason for referral:  Chief Complaint  Patient presents with  . New Evaluation    eval abd abi's - [place], R foot ulcerations     HISTORY OF PRESENT ILLNESS: This is an 82 year old female who comes in today for evaluation of right foot ulcer.  She states they have been present for approximately 5 months.  She has undergone treatment at the wound center who is referring her to [place].  She is currently getting topical ointments.  She has had several ulcers heal particular on the toes.  She has 5 existing ulcers.  She is just doing topical ointment to them.  She also had a sore on her left great toe which has improved.  The patient suffers from diabetes which she states is under good control.  She is on a statin for hypercholesterolemia.  She is also treated for hypertension.  These medications have been manipulated recently secondary to hypotension.  She has a history of atrial fibrillation and has been on anticoagulation.  This has been discontinued because of frequent falls.  The patient recently suffered a fall where she broke multiple ribs.  She continued to have several falls which has been thought to be secondary to narcotic medication to treat her rib fractures.  There is also concern that this may be hypotension.  She has a sacral decubitus and is currently in the rehabilitation facility at a skilled nursing facility.  She does ambulate on her own but this has been limited recently with her frequent falls.  Prior to being in the nursing facility she lived by herself.  Her husband died back in 2022-12-05.  Past Medical History  Diagnosis Date  . Depression   . Essential hypertension, benign   . Hypercholesteremia   . Insulin dependent diabetes mellitus ([place])   . Essential thrombocytosis (HCC)     JAK2 negative - followed by Dr. Owens Loffler  . Coronary atherosclerosis of native coronary  artery     Multivessel status post CABG in [place]  . Atrial fibrillation ([place])   . Myelofibrosis [facility])     Past Surgical History  Procedure Laterality Date  . Abdominal hysterectomy    . Cataract extraction    . Coronary artery bypass graft      Possibly 2002 in [place]  . Cardioversion N/A 09/09/2014    Procedure: CARDIOVERSION;  Surgeon: Josue Hector, MD;  Location: [facility] ENDOSCOPY;  Service: Cardiovascular;  Laterality: N/A;  . Cardioversion N/A 10/03/2014    Procedure: CARDIOVERSION;  Surgeon: Arnoldo Lenis, MD;  Location: [facility] ORS;  Service: Endoscopy;  Laterality: N/A;    Social History   Social History  . Marital Status: Widowed    Spouse Name: N/A  . Number of Children: N/A  . Years of Education: N/A   Occupational History  . Retired     Environmental manager   Social History Main Topics  . Smoking status: Never Smoker   . Smokeless tobacco: Never Used  . Alcohol Use: 0.0 oz/week    0 Standard drinks or equivalent per week     Comment: Socially  . Drug Use: No  . Sexual Activity: Not on file   Other Topics Concern  . Not on file   Social History Narrative    Family History  Problem Relation Age of Onset  . Coronary artery disease      Allergies as of  08/17/2015  . (No Known Allergies)    Current Outpatient Prescriptions on File Prior to Visit  Medication Sig Dispense Refill  . albuterol (PROAIR HFA) 108 (90 BASE) MCG/ACT inhaler Inhale 2 puffs into the lungs every 6 (six) hours as needed for wheezing or shortness of breath.    Marland Kitchen atorvastatin (LIPITOR) 40 MG tablet Take 1 tablet (40 mg total) by mouth daily at 6 PM. 30 tablet 0  . diltiazem (CARTIA XT) 120 MG 24 hr capsule Take 120 mg by mouth daily.    . fentaNYL (DURAGESIC - DOSED MCG/HR) 25 MCG/HR patch Place 25 mcg onto the skin every 3 (three) days.    . furosemide (LASIX) 20 MG tablet Take 20 mg by mouth.    . hydroxyurea (HYDREA) 500 MG capsule Take 500 mg by mouth daily.     . insulin NPH-regular Human  (NOVOLIN 70/30) (70-30) 100 UNIT/ML injection Inject 10 Units into the skin 2 (two) times daily with a meal.    . metoprolol succinate (TOPROL-XL) 50 MG 24 hr tablet Take 50 mg by mouth daily. Take with or immediately following a meal.    . neomycin-bacitracin-polymyxin (NEOSPORIN) OINT Apply 1 application topically daily. 30 g 0  . oxyCODONE-acetaminophen (PERCOCET/ROXICET) 5-325 MG tablet Take 1 tablet by mouth every 4 (four) hours as needed for severe pain.    . OXYGEN Inhale 2 L into the lungs.    . rivaroxaban (XARELTO) 20 MG TABS tablet Take 1 tablet (20 mg total) by mouth daily after supper. 30 tablet 6  . sertraline (ZOLOFT) 100 MG tablet Take 100 mg by mouth daily.    Marland Kitchen tiotropium (SPIRIVA) 18 MCG inhalation capsule Place 1 capsule (18 mcg total) into inhaler and inhale daily. 30 capsule 0   No current facility-administered medications on file prior to visit.     REVIEW OF SYSTEMS: See history of present illness, otherwise negative  PHYSICAL EXAMINATION:  Filed Vitals:   08/17/15 0954  BP: 100/60  Pulse: 99  Height: 5\' 9"  (1.753 m)  Weight: 155 lb (70.308 kg)  SpO2: 94%   Body mass index is 22.88 kg/(m^2). General: The patient appears their stated age.   HEENT:  No gross abnormalities Pulmonary: Respirations are non-labored Abdomen: Soft and non-tender .  Sacral decubitus Musculoskeletal: There are no major deformities.   Neurologic: No focal weakness or paresthesias are detected, Skin: 5 subcentimeter ulcers on the right foot. Psychiatric: The patient has normal affect. Cardiovascular: There is a regular rate and rhythm without significant murmur appreciated.  Palpable femoral pulses, nonpalpable pedal on the right  Diagnostic Studies: The patient had arterial duplex today which shows an occluded right superficial femoral artery as well as single-vessel runoff through the anterior tibial.  Outside ultrasound shows a right ABI of 0.45 and a left of 1.3.  No significant  stenosis was identified on the left    Assessment:  Atherosclerosis with right leg ulcer Plan: I discussed with the patient that given her ultrasound findings of a right superficial femoral artery occlusion, the next step is to proceed with angiography.  This will be done through a left femoral approach and intervention on the right leg if felt to be indicated.  This is been scheduled for Wednesday, March 22.  She will need to be offers her Jennye Moccasin.  She understands this is a limb threatening situation.  All of her questions were answered today.     Eldridge Abrahams, M.D. [facility]  Office: [phone] Pager:  [phone]

## 2015-08-19 ENCOUNTER — Encounter: Payer: Self-pay | Admitting: Internal Medicine

## 2015-08-26 ENCOUNTER — Encounter (HOSPITAL_COMMUNITY)
Admission: RE | Disposition: A | Discharge status: Skilled Nursing Facility | Payer: Self-pay | Source: Ambulatory Visit | Attending: Surgery

## 2015-08-26 ENCOUNTER — Observation Stay (HOSPITAL_COMMUNITY)
Admission: RE | Admit: 2015-08-26 | Discharge: 2015-08-27 | Disposition: A | Discharge status: Skilled Nursing Facility | Payer: Medicare Other | Source: Ambulatory Visit | Attending: Surgery | Admitting: Surgery

## 2015-08-26 DIAGNOSIS — Z79899 Other long term (current) drug therapy: Secondary | ICD-10-CM | POA: Insufficient documentation

## 2015-08-26 DIAGNOSIS — Z794 Long term (current) use of insulin: Secondary | ICD-10-CM | POA: Diagnosis not present

## 2015-08-26 DIAGNOSIS — I70235 Atherosclerosis of native arteries of right leg with ulceration of other part of foot: Principal | ICD-10-CM | POA: Insufficient documentation

## 2015-08-26 DIAGNOSIS — L97519 Non-pressure chronic ulcer of other part of right foot with unspecified severity: Secondary | ICD-10-CM | POA: Insufficient documentation

## 2015-08-26 DIAGNOSIS — Z9981 Dependence on supplemental oxygen: Secondary | ICD-10-CM | POA: Diagnosis not present

## 2015-08-26 DIAGNOSIS — L89159 Pressure ulcer of sacral region, unspecified stage: Secondary | ICD-10-CM | POA: Insufficient documentation

## 2015-08-26 DIAGNOSIS — Z951 Presence of aortocoronary bypass graft: Secondary | ICD-10-CM | POA: Diagnosis not present

## 2015-08-26 DIAGNOSIS — J449 Chronic obstructive pulmonary disease, unspecified: Secondary | ICD-10-CM | POA: Diagnosis not present

## 2015-08-26 DIAGNOSIS — I739 Peripheral vascular disease, unspecified: Secondary | ICD-10-CM | POA: Diagnosis present

## 2015-08-26 DIAGNOSIS — E119 Type 2 diabetes mellitus without complications: Secondary | ICD-10-CM | POA: Insufficient documentation

## 2015-08-26 DIAGNOSIS — I10 Essential (primary) hypertension: Secondary | ICD-10-CM | POA: Insufficient documentation

## 2015-08-26 DIAGNOSIS — Z7901 Long term (current) use of anticoagulants: Secondary | ICD-10-CM | POA: Insufficient documentation

## 2015-08-26 DIAGNOSIS — N186 End stage renal disease: Secondary | ICD-10-CM

## 2015-08-26 DIAGNOSIS — Z792 Long term (current) use of antibiotics: Secondary | ICD-10-CM | POA: Insufficient documentation

## 2015-08-26 DIAGNOSIS — F329 Major depressive disorder, single episode, unspecified: Secondary | ICD-10-CM | POA: Insufficient documentation

## 2015-08-26 DIAGNOSIS — D7581 Myelofibrosis: Secondary | ICD-10-CM | POA: Diagnosis not present

## 2015-08-26 DIAGNOSIS — D473 Essential (hemorrhagic) thrombocythemia: Secondary | ICD-10-CM | POA: Insufficient documentation

## 2015-08-26 DIAGNOSIS — E7800 Pure hypercholesterolemia, unspecified: Secondary | ICD-10-CM | POA: Diagnosis not present

## 2015-08-26 DIAGNOSIS — L8990 Pressure ulcer of unspecified site, unspecified stage: Secondary | ICD-10-CM | POA: Insufficient documentation

## 2015-08-26 DIAGNOSIS — R296 Repeated falls: Secondary | ICD-10-CM | POA: Diagnosis not present

## 2015-08-26 DIAGNOSIS — I480 Paroxysmal atrial fibrillation: Secondary | ICD-10-CM | POA: Insufficient documentation

## 2015-08-26 DIAGNOSIS — R569 Unspecified convulsions: Secondary | ICD-10-CM | POA: Insufficient documentation

## 2015-08-26 DIAGNOSIS — Z992 Dependence on renal dialysis: Secondary | ICD-10-CM

## 2015-08-26 DIAGNOSIS — Z7982 Long term (current) use of aspirin: Secondary | ICD-10-CM | POA: Insufficient documentation

## 2015-08-26 DIAGNOSIS — I2510 Atherosclerotic heart disease of native coronary artery without angina pectoris: Secondary | ICD-10-CM | POA: Diagnosis not present

## 2015-08-26 LAB — BASIC METABOLIC PANEL
Anion gap: 8 (ref 5–15)
BUN: 11 mg/dL (ref 6–20)
CHLORIDE: 95 mmol/L — AB (ref 101–111)
CO2: 31 mmol/L (ref 22–32)
CREATININE: 0.84 mg/dL (ref 0.44–1.00)
Calcium: 8.7 mg/dL — ABNORMAL LOW (ref 8.9–10.3)
GFR calc non Af Amer: >60 mL/min (ref >60)
Glucose, Bld: 229 mg/dL — ABNORMAL HIGH (ref 65–99)
Potassium: 4.4 mmol/L (ref 3.5–5.1)
Sodium: 134 mmol/L — ABNORMAL LOW (ref 135–145)

## 2015-08-26 LAB — URINALYSIS, ROUTINE W REFLEX MICROSCOPIC
BILIRUBIN URINE: NEGATIVE
Glucose, UA: NEGATIVE mg/dL
Ketones, ur: NEGATIVE mg/dL
NITRITE: NEGATIVE
PH: 7.5 (ref 5.0–8.0)
Protein, ur: 30 mg/dL — AB
SPECIFIC GRAVITY, URINE: 1.011 (ref 1.005–1.030)

## 2015-08-26 LAB — CREATININE, SERUM
CREATININE: 0.77 mg/dL (ref 0.44–1.00)
GFR calc Af Amer: >60 mL/min (ref >60)
GFR calc non Af Amer: >60 mL/min (ref >60)

## 2015-08-26 LAB — CBC
HCT: 38.4 % (ref 36.0–46.0)
HEMOGLOBIN: 12.7 g/dL (ref 12.0–15.0)
MCH: 36.3 pg — AB (ref 26.0–34.0)
MCHC: 33.1 g/dL (ref 30.0–36.0)
MCV: 109.7 fL — ABNORMAL HIGH (ref 78.0–100.0)
PLATELETS: 859 10*3/uL — AB (ref 150–400)
RBC: 3.5 MIL/uL — ABNORMAL LOW (ref 3.87–5.11)
RDW: 23.6 % — ABNORMAL HIGH (ref 11.5–15.5)
WBC: 15 10*3/uL — ABNORMAL HIGH (ref 4.0–10.5)

## 2015-08-26 LAB — URINE MICROSCOPIC-ADD ON

## 2015-08-26 LAB — GLUCOSE, CAPILLARY
Glucose-Capillary: 146 mg/dL — ABNORMAL HIGH (ref 65–99)
Glucose-Capillary: 210 mg/dL — ABNORMAL HIGH (ref 65–99)

## 2015-08-26 SURGERY — ABDOMINAL AORTOGRAM W/LOWER EXTREMITY

## 2015-08-26 MED ORDER — FENTANYL 25 MCG/HR TD PT72: 25.0000 ug | MEDICATED_PATCH | TRANSDERMAL | Status: DC

## 2015-08-26 MED ORDER — SERTRALINE HCL 50 MG PO TABS
50.0000 mg | ORAL_TABLET | Freq: Every day | ORAL | Status: DC
  Administered 2015-08-26 – 2015-08-27 (×2): 50 mg via ORAL
  Filled 2015-08-26 (×2): qty 1

## 2015-08-26 MED ORDER — SERTRALINE HCL 100 MG PO TABS: 100.0000 mg | ORAL_TABLET | Freq: Every day | ORAL | Status: DC

## 2015-08-26 MED ORDER — INSULIN ASPART 100 UNIT/ML ~~LOC~~ SOLN
0.0000 [IU] | Freq: Three times a day (TID) | SUBCUTANEOUS | Status: DC
  Administered 2015-08-26: 1 [IU] via SUBCUTANEOUS
  Administered 2015-08-27 (×2): 2 [IU] via SUBCUTANEOUS

## 2015-08-26 MED ORDER — ENOXAPARIN SODIUM 30 MG/0.3ML ~~LOC~~ SOLN
30.0000 mg | SUBCUTANEOUS | Status: DC
  Administered 2015-08-26: 30 mg via SUBCUTANEOUS
  Filled 2015-08-26: qty 0.3

## 2015-08-26 MED ORDER — TIOTROPIUM BROMIDE MONOHYDRATE 18 MCG IN CAPS
18.0000 ug | ORAL_CAPSULE | Freq: Every day | RESPIRATORY_TRACT | Status: DC
  Filled 2015-08-26: qty 5

## 2015-08-26 MED ORDER — METOPROLOL SUCCINATE ER 50 MG PO TB24: 50.0000 mg | ORAL_TABLET | Freq: Every day | ORAL | Status: DC

## 2015-08-26 MED ORDER — ACETAMINOPHEN 325 MG PO TABS: 650.0000 mg | ORAL_TABLET | ORAL | Status: DC | PRN

## 2015-08-26 MED ORDER — ALBUTEROL SULFATE (2.5 MG/3ML) 0.083% IN NEBU: 2.5000 mg | INHALATION_SOLUTION | Freq: Four times a day (QID) | RESPIRATORY_TRACT | Status: DC | PRN

## 2015-08-26 MED ORDER — OXYCODONE-ACETAMINOPHEN 5-325 MG PO TABS
1.0000 | ORAL_TABLET | ORAL | Status: DC | PRN
  Administered 2015-08-27: 1 via ORAL
  Filled 2015-08-26: qty 1

## 2015-08-26 MED ORDER — MELATONIN-PYRIDOXINE 3-1 MG PO TABS: ORAL_TABLET | Freq: Every day | ORAL | Status: DC

## 2015-08-26 MED ORDER — ALUM & MAG HYDROXIDE-SIMETH 200-200-20 MG/5ML PO SUSP: 15.0000 mL | ORAL | Status: DC | PRN

## 2015-08-26 MED ORDER — SODIUM CHLORIDE 0.9 % IV SOLN
INTRAVENOUS | Status: DC
  Administered 2015-08-26: 08:00:00 via INTRAVENOUS

## 2015-08-26 MED ORDER — GUAIFENESIN-DM 100-10 MG/5ML PO SYRP: 15.0000 mL | ORAL_SOLUTION | ORAL | Status: DC | PRN

## 2015-08-26 MED ORDER — PANTOPRAZOLE SODIUM 40 MG PO TBEC: 40.0000 mg | DELAYED_RELEASE_TABLET | Freq: Every day | ORAL | Status: DC

## 2015-08-26 MED ORDER — BACITRACIN-NEOMYCIN-POLYMYXIN OINTMENT TUBE: 1.0000 "application " | TOPICAL_OINTMENT | Freq: Every day | CUTANEOUS | Status: DC

## 2015-08-26 MED ORDER — METOPROLOL SUCCINATE ER 25 MG PO TB24
25.0000 mg | ORAL_TABLET | Freq: Every day | ORAL | Status: DC
  Administered 2015-08-26 – 2015-08-27 (×2): 25 mg via ORAL
  Filled 2015-08-26 (×2): qty 1

## 2015-08-26 MED ORDER — ASPIRIN EC 81 MG PO TBEC
81.0000 mg | DELAYED_RELEASE_TABLET | Freq: Every day | ORAL | Status: DC
  Administered 2015-08-26 – 2015-08-27 (×2): 81 mg via ORAL
  Filled 2015-08-26 (×2): qty 1

## 2015-08-26 MED ORDER — ATORVASTATIN CALCIUM 40 MG PO TABS
40.0000 mg | ORAL_TABLET | Freq: Every day | ORAL | Status: DC
  Administered 2015-08-26 – 2015-08-27 (×2): 40 mg via ORAL
  Filled 2015-08-26 (×2): qty 1

## 2015-08-26 MED ORDER — ONDANSETRON HCL 4 MG/2ML IJ SOLN: 4.0000 mg | Freq: Four times a day (QID) | INTRAMUSCULAR | Status: DC | PRN

## 2015-08-26 MED ORDER — METOPROLOL TARTRATE 1 MG/ML IV SOLN: 2.0000 mg | INTRAVENOUS | Status: DC | PRN

## 2015-08-26 MED ORDER — HYDROXYUREA 500 MG PO CAPS
500.0000 mg | ORAL_CAPSULE | Freq: Every day | ORAL | Status: DC
  Administered 2015-08-26 – 2015-08-27 (×2): 500 mg via ORAL
  Filled 2015-08-26 (×3): qty 1

## 2015-08-26 MED ORDER — LABETALOL HCL 5 MG/ML IV SOLN: 10.0000 mg | INTRAVENOUS | Status: DC | PRN

## 2015-08-26 MED ORDER — POTASSIUM CHLORIDE CRYS ER 20 MEQ PO TBCR: 20.0000 meq | EXTENDED_RELEASE_TABLET | Freq: Once | ORAL | Status: DC

## 2015-08-26 MED ORDER — DILTIAZEM HCL ER COATED BEADS 120 MG PO CP24
120.0000 mg | ORAL_CAPSULE | Freq: Every day | ORAL | Status: DC
  Administered 2015-08-26 – 2015-08-27 (×2): 120 mg via ORAL
  Filled 2015-08-26 (×2): qty 1

## 2015-08-26 MED ORDER — TRAMADOL HCL 50 MG PO TABS: 50.0000 mg | ORAL_TABLET | Freq: Four times a day (QID) | ORAL | Status: DC | PRN

## 2015-08-26 MED ORDER — HYDRALAZINE HCL 20 MG/ML IJ SOLN: 5.0000 mg | INTRAMUSCULAR | Status: DC | PRN

## 2015-08-26 MED ORDER — PHENOL 1.4 % MT LIQD: 1.0000 | OROMUCOSAL | Status: DC | PRN

## 2015-08-26 MED ORDER — PIPERACILLIN-TAZOBACTAM IN DEX 2-0.25 GM/50ML IV SOLN
2.2500 g | Freq: Three times a day (TID) | INTRAVENOUS | Status: DC
  Administered 2015-08-26 – 2015-08-27 (×3): 2.25 g via INTRAVENOUS
  Filled 2015-08-26 (×7): qty 50

## 2015-08-26 MED ORDER — SODIUM CHLORIDE 0.9 % IV SOLN
INTRAVENOUS | Status: DC
  Administered 2015-08-26: 19:00:00 via INTRAVENOUS

## 2015-08-26 NOTE — Progress Notes (Signed)
Pharmacy Antibiotic Note  Theresa Bird is a 82 y.o. female admitted on 08/26/2015 with wound infection.  Pharmacy has been consulted for Zosyn dosing.  Taken to OR this am for angiography to evaluate femoral artery occlusion. She is currently afebrile, wbc is elevated at 15.6, does not appear to have received any antibiotics this morning.   AKI noted with scr of 2.8, previously normal back in 8/16. Will dose adjust abx appropriately. No cultures appear to have been done or have not crossed over.  Plan: Will start zosyn 2.25 g IV q8 hours Follow up LOT, s/s of infection, culture data   Height: 5\' 9"  (175.3 cm) Weight: 152 lb (68.947 kg) IBW/kg (Calculated) : 66.2  Temp (24hrs), Avg:98 F (36.7 C), Min:98 F (36.7 C), Max:98 F (36.7 C)   Recent Labs Lab 08/26/15 0739  CREATININE 2.80*    Estimated Creatinine Clearance: 16.7 mL/min (by C-G formula based on Cr of 2.8).    No Known Allergies  Antimicrobials this admission: Zosyn 3/22 >>    Microbiology results: None   Thank you for allowing pharmacy to be a part of this patient's care.  Erin Hearing PharmD., BCPS Clinical Pharmacist Pager [phone] 08/26/2015 10:50 AM

## 2015-08-26 NOTE — Progress Notes (Signed)
Patient admitted to 2W. She is resting with the call light within reach and educated on calling before she gets up.   Patient does have an existing pressure ulcer on her bottom, stage 2. She has 5 open wounds on her right foot with one on her heel. She has one on her left foot. Wound Consult has been ordered for tomorrow.

## 2015-08-26 NOTE — Op Note (Deleted)
2

## 2015-08-26 NOTE — Progress Notes (Signed)
Report given to Lattie Haw, RN on 2W. Pt transported via wheelchair for admission

## 2015-08-26 NOTE — H&P (Signed)
Patient name: Theresa Bird: [id number] DOB: November 14, 1935Sex: female   Referred by: Dr. Nils Pyle  Reason for referral:  Chief Complaint  Patient presents with  . New Evaluation    eval abd abi's - [place], R foot ulcerations     HISTORY OF PRESENT ILLNESS: This is an 82 year old female who comes in today for evaluation of right foot ulcer. She states they have been present for approximately 5 months. She has undergone treatment at the wound center who is referring her to [place]. She is currently getting topical ointments. She has had several ulcers heal particular on the toes. She has 5 existing ulcers. She is just doing topical ointment to them. She also had a sore on her left great toe which has improved.  The patient suffers from diabetes which she states is under good control. She is on a statin for hypercholesterolemia. She is also treated for hypertension. These medications have been manipulated recently secondary to hypotension. She has a history of atrial fibrillation and has been on anticoagulation. This has been discontinued because of frequent falls.  The patient recently suffered a fall where she broke multiple ribs. She continued to have several falls which has been thought to be secondary to narcotic medication to treat her rib fractures. There is also concern that this may be hypotension. She has a sacral decubitus and is currently in the rehabilitation facility at a skilled nursing facility. She does ambulate on her own but this has been limited recently with her frequent falls. Prior to being in the nursing facility she lived by herself. Her husband died back in 09-Nov-2022.  Past Medical History  Diagnosis Date  . Depression   . Essential hypertension, benign   . Hypercholesteremia   . Insulin dependent diabetes mellitus ([place])   . Essential thrombocytosis (HCC)     JAK2 negative - followed by Dr.  Owens Loffler  . Coronary atherosclerosis of native coronary artery     Multivessel status post CABG in [place]  . Atrial fibrillation ([place])   . Myelofibrosis [facility])     Past Surgical History  Procedure Laterality Date  . Abdominal hysterectomy    . Cataract extraction    . Coronary artery bypass graft      Possibly 2002 in [place]  . Cardioversion N/A 09/09/2014    Procedure: CARDIOVERSION; Surgeon: Josue Hector, MD; Location: [facility] ENDOSCOPY; Service: Cardiovascular; Laterality: N/A;  . Cardioversion N/A 10/03/2014    Procedure: CARDIOVERSION; Surgeon: Arnoldo Lenis, MD; Location: [facility] ORS; Service: Endoscopy; Laterality: N/A;    Social History   Social History  . Marital Status: Widowed    Spouse Name: N/A  . Number of Children: N/A  . Years of Education: N/A   Occupational History  . Retired     Environmental manager   Social History Main Topics  . Smoking status: Never Smoker   . Smokeless tobacco: Never Used  . Alcohol Use: 0.0 oz/week    0 Standard drinks or equivalent per week     Comment: Socially  . Drug Use: No  . Sexual Activity: Not on file   Other Topics Concern  . Not on file   Social History Narrative    Family History  Problem Relation Age of Onset  . Coronary artery disease      Allergies as of 08/17/2015  . (No Known Allergies)    Current Outpatient Prescriptions on File Prior to Visit  Medication Sig Dispense Refill  . albuterol (PROAIR HFA) 108 (90  BASE) MCG/ACT inhaler Inhale 2 puffs into the lungs every 6 (six) hours as needed for wheezing or shortness of breath.    Marland Kitchen atorvastatin (LIPITOR) 40 MG tablet Take 1 tablet (40 mg total) by mouth daily at 6 PM. 30 tablet 0  . diltiazem (CARTIA XT) 120 MG 24 hr capsule Take 120 mg by mouth daily.    . fentaNYL (DURAGESIC - DOSED MCG/HR) 25 MCG/HR patch Place  25 mcg onto the skin every 3 (three) days.    . furosemide (LASIX) 20 MG tablet Take 20 mg by mouth.    . hydroxyurea (HYDREA) 500 MG capsule Take 500 mg by mouth daily.     . insulin NPH-regular Human (NOVOLIN 70/30) (70-30) 100 UNIT/ML injection Inject 10 Units into the skin 2 (two) times daily with a meal.    . metoprolol succinate (TOPROL-XL) 50 MG 24 hr tablet Take 50 mg by mouth daily. Take with or immediately following a meal.    . neomycin-bacitracin-polymyxin (NEOSPORIN) OINT Apply 1 application topically daily. 30 g 0  . oxyCODONE-acetaminophen (PERCOCET/ROXICET) 5-325 MG tablet Take 1 tablet by mouth every 4 (four) hours as needed for severe pain.    . OXYGEN Inhale 2 L into the lungs.    . rivaroxaban (XARELTO) 20 MG TABS tablet Take 1 tablet (20 mg total) by mouth daily after supper. 30 tablet 6  . sertraline (ZOLOFT) 100 MG tablet Take 100 mg by mouth daily.    Marland Kitchen tiotropium (SPIRIVA) 18 MCG inhalation capsule Place 1 capsule (18 mcg total) into inhaler and inhale daily. 30 capsule 0   No current facility-administered medications on file prior to visit.     REVIEW OF SYSTEMS: See history of present illness, otherwise negative  PHYSICAL EXAMINATION:  Filed Vitals:   08/17/15 0954  BP: 100/60  Pulse: 99  Height: 5\' 9"  (1.753 m)  Weight: 155 lb (70.308 kg)  SpO2: 94%   Body mass index is 22.88 kg/(m^2). General: The patient appears their stated age.  HEENT: No gross abnormalities Pulmonary: Respirations are non-labored Abdomen: Soft and non-tender . Sacral decubitus Musculoskeletal: There are no major deformities.  Neurologic: No focal weakness or paresthesias are detected, Skin: 5 subcentimeter ulcers on the right foot. Psychiatric: The patient has normal affect. Cardiovascular: There is a regular rate and rhythm without significant murmur appreciated. Palpable femoral pulses, nonpalpable  pedal on the right  Diagnostic Studies: The patient had arterial duplex today which shows an occluded right superficial femoral artery as well as single-vessel runoff through the anterior tibial. Outside ultrasound shows a right ABI of 0.45 and a left of 1.3. No significant stenosis was identified on the left    Assessment:  Atherosclerosis with right leg ulcer Plan: I discussed with the patient that given her ultrasound findings of a right superficial femoral artery occlusion, the next step is to proceed with angiography. This will be done through a left femoral approach and intervention on the right leg if felt to be indicated. This is been scheduled for Wednesday, March 22. She will need to be offers her Jennye Moccasin. She understands this is a limb threatening situation. All of her questions were answered today.     Eldridge Abrahams, M.D. [facility] [place] Office: [phone] Pager: [phone]       Procedure cancelled as her creatinine is elevated.  She reports a new foot wound with erythema.  Will admit to hospital for IV hydration and IV abx  Wells Brabham

## 2015-08-27 ENCOUNTER — Encounter (HOSPITAL_COMMUNITY)
Admission: RE | Disposition: A | Discharge status: Skilled Nursing Facility | Payer: Self-pay | Source: Ambulatory Visit | Attending: Surgery

## 2015-08-27 DIAGNOSIS — I70235 Atherosclerosis of native arteries of right leg with ulceration of other part of foot: Secondary | ICD-10-CM | POA: Diagnosis not present

## 2015-08-27 DIAGNOSIS — L8990 Pressure ulcer of unspecified site, unspecified stage: Secondary | ICD-10-CM | POA: Insufficient documentation

## 2015-08-27 DIAGNOSIS — I70234 Atherosclerosis of native arteries of right leg with ulceration of heel and midfoot: Secondary | ICD-10-CM | POA: Diagnosis not present

## 2015-08-27 HISTORY — PX: PERIPHERAL VASCULAR CATHETERIZATION: SHX172C

## 2015-08-27 LAB — CREATININE, SERUM: CREATININE: 0.76 mg/dL (ref 0.44–1.00)

## 2015-08-27 LAB — POCT I-STAT, CHEM 8
BUN: 19 mg/dL (ref 6–20)
CALCIUM ION: 1.04 mmol/L — AB (ref 1.13–1.30)
CREATININE: 2.8 mg/dL — AB (ref 0.44–1.00)
Chloride: 91 mmol/L — ABNORMAL LOW (ref 101–111)
Glucose, Bld: 154 mg/dL — ABNORMAL HIGH (ref 65–99)
HEMATOCRIT: 46 % (ref 36.0–46.0)
Hemoglobin: 15.6 g/dL — ABNORMAL HIGH (ref 12.0–15.0)
Potassium: 4.8 mmol/L (ref 3.5–5.1)
Sodium: 130 mmol/L — ABNORMAL LOW (ref 135–145)
TCO2: 33 mmol/L (ref 0–100)

## 2015-08-27 LAB — CBC
HCT: 36.1 % (ref 36.0–46.0)
HCT: 38 % (ref 36.0–46.0)
HEMOGLOBIN: 11.7 g/dL — AB (ref 12.0–15.0)
Hemoglobin: 12.2 g/dL (ref 12.0–15.0)
MCH: 36 pg — AB (ref 26.0–34.0)
MCH: 35.3 pg — ABNORMAL HIGH (ref 26.0–34.0)
MCHC: 32.4 g/dL (ref 30.0–36.0)
MCHC: 32.1 g/dL (ref 30.0–36.0)
MCV: 111.1 fL — AB (ref 78.0–100.0)
MCV: 109.8 fL — AB (ref 78.0–100.0)
PLATELETS: 845 10*3/uL — AB (ref 150–400)
PLATELETS: 900 10*3/uL — AB (ref 150–400)
RBC: 3.25 MIL/uL — AB (ref 3.87–5.11)
RBC: 3.46 MIL/uL — AB (ref 3.87–5.11)
RDW: 23.2 % — ABNORMAL HIGH (ref 11.5–15.5)
RDW: 23.5 % — AB (ref 11.5–15.5)
WBC: 15 10*3/uL — ABNORMAL HIGH (ref 4.0–10.5)
WBC: 16.3 10*3/uL — AB (ref 4.0–10.5)

## 2015-08-27 LAB — PROTIME-INR
INR: 1.34 (ref 0.00–1.49)
PROTHROMBIN TIME: 16.7 s — AB (ref 11.6–15.2)

## 2015-08-27 LAB — GLUCOSE, CAPILLARY
GLUCOSE-CAPILLARY: 149 mg/dL — AB (ref 65–99)
Glucose-Capillary: 175 mg/dL — ABNORMAL HIGH (ref 65–99)

## 2015-08-27 LAB — BASIC METABOLIC PANEL
ANION GAP: 8 (ref 5–15)
BUN: 7 mg/dL (ref 6–20)
CALCIUM: 8.7 mg/dL — AB (ref 8.9–10.3)
CO2: 28 mmol/L (ref 22–32)
CREATININE: 0.84 mg/dL (ref 0.44–1.00)
Chloride: 97 mmol/L — ABNORMAL LOW (ref 101–111)
GFR calc non Af Amer: >60 mL/min (ref >60)
Glucose, Bld: 159 mg/dL — ABNORMAL HIGH (ref 65–99)
Potassium: 3.9 mmol/L (ref 3.5–5.1)
Sodium: 133 mmol/L — ABNORMAL LOW (ref 135–145)

## 2015-08-27 LAB — HEMOGLOBIN A1C
Hgb A1c MFr Bld: 5.9 % — ABNORMAL HIGH (ref 4.8–5.6)
Mean Plasma Glucose: 123 mg/dL

## 2015-08-27 SURGERY — ABDOMINAL AORTOGRAM W/LOWER EXTREMITY

## 2015-08-27 MED ORDER — OXYCODONE-ACETAMINOPHEN 5-325 MG PO TABS: 1.0000 | ORAL_TABLET | ORAL | Status: DC | PRN

## 2015-08-27 MED ORDER — COLLAGENASE 250 UNIT/GM EX OINT
TOPICAL_OINTMENT | Freq: Every day | CUTANEOUS | Status: DC
  Administered 2015-08-27: 12:00:00 via TOPICAL
  Filled 2015-08-27: qty 30

## 2015-08-27 MED ORDER — PIPERACILLIN-TAZOBACTAM 3.375 G IVPB
3.3750 g | Freq: Three times a day (TID) | INTRAVENOUS | Status: DC
  Filled 2015-08-27 (×3): qty 50

## 2015-08-27 MED ORDER — LIDOCAINE HCL (PF) 1 % IJ SOLN
INTRAMUSCULAR | Status: DC | PRN
  Administered 2015-08-27: 13 mL via SUBCUTANEOUS

## 2015-08-27 MED ORDER — CEPHALEXIN 500 MG PO CAPS: 500.0000 mg | ORAL_CAPSULE | Freq: Three times a day (TID) | ORAL | Status: DC

## 2015-08-27 MED ORDER — HEPARIN SODIUM (PORCINE) 1000 UNIT/ML IJ SOLN
INTRAMUSCULAR | Status: DC | PRN
  Administered 2015-08-27: 1000 [IU] via INTRAVENOUS

## 2015-08-27 MED ORDER — LIDOCAINE HCL (PF) 1 % IJ SOLN
INTRAMUSCULAR | Status: AC
  Filled 2015-08-27: qty 30

## 2015-08-27 MED ORDER — SODIUM CHLORIDE 0.9 % IV SOLN: 1.0000 mL/kg/h | INTRAVENOUS | Status: AC

## 2015-08-27 MED ORDER — HEPARIN (PORCINE) IN NACL 2-0.9 UNIT/ML-% IJ SOLN
INTRAMUSCULAR | Status: AC
  Filled 2015-08-27: qty 1000

## 2015-08-27 MED ORDER — ENOXAPARIN SODIUM 40 MG/0.4ML ~~LOC~~ SOLN: 40.0000 mg | SUBCUTANEOUS | Status: DC

## 2015-08-27 MED ORDER — IODIXANOL 320 MG/ML IV SOLN
INTRAVENOUS | Status: DC | PRN
  Administered 2015-08-27: 90 mL via INTRA_ARTERIAL

## 2015-08-27 SURGICAL SUPPLY — 10 items
CATH OMNI FLUSH 5F 65CM (CATHETERS) ×3 IMPLANT
COVER PRB 48X5XTLSCP FOLD TPE (BAG) ×1 IMPLANT
COVER PROBE 5X48 (BAG) ×2
KIT MICROINTRODUCER STIFF 5F (SHEATH) ×3 IMPLANT
KIT PV (KITS) ×3 IMPLANT
SHEATH PINNACLE 5F 10CM (SHEATH) ×3 IMPLANT
SYR MEDRAD MARK V 150ML (SYRINGE) ×3 IMPLANT
TRANSDUCER W/STOPCOCK (MISCELLANEOUS) ×3 IMPLANT
TRAY PV CATH (CUSTOM PROCEDURE TRAY) ×3 IMPLANT
WIRE BENTSON .035X145CM (WIRE) ×3 IMPLANT

## 2015-08-27 NOTE — Progress Notes (Signed)
Pharmacy Antibiotic Note  BEETA DINIS is a 82 y.o. female admitted on 08/26/2015 with wound infection.  Pharmacy has been consulted for Zosyn dosing. SCr of 2.8 on 3/22 is likely error. SCr= 0.77 today and CrCl ~ 55. Noted plans for po kelfex  Plan: -Change zosyn to 3.375gm IV q8h -Will sign off. Please contact pharmacy with any other needs.  Thank you   Height: 5\' 9"  (175.3 cm) Weight: 152 lb (68.947 kg) IBW/kg (Calculated) : 66.2  Temp (24hrs), Avg:98.1 F (36.7 C), Min:97.6 F (36.4 C), Max:98.8 F (37.1 C)   Recent Labs Lab 08/26/15 0739 08/26/15 1201 08/26/15 1426 08/27/15 0330 08/27/15 1146  WBC  --  15.0*  --  15.0* 16.3*  CREATININE 2.80* 0.77 0.84 0.84  --     Estimated Creatinine Clearance: 55.8 mL/min (by C-G formula based on Cr of 0.84).    No Known Allergies  Antimicrobials this admission: Zosyn 3/22 >>    Microbiology results: None   Thank you for allowing pharmacy to be a part of this patient's care.  Erin Hearing PharmD., BCPS Clinical Pharmacist Pager [phone] 08/27/2015 12:30 PM

## 2015-08-27 NOTE — Discharge Summary (Signed)
Discharge Summary    Theresa Bird 27-Dec-1933 82 y.o. female  [id number]  Admission Date: 08/26/2015  Discharge Date: 08/27/15  Physician: Serafina Mitchell, MD  Admission Diagnosis: pvd w right foot ulcer claudication   HPI:   This is a 82 y.o. female who comes in today for evaluation of right foot ulcer. She states they have been present for approximately 5 months. She has undergone treatment at the wound center who is referring her to [place]. She is currently getting topical ointments. She has had several ulcers heal particular on the toes. She has 5 existing ulcers. She is just doing topical ointment to them. She also had a sore on her left great toe which has improved.  The patient suffers from diabetes which she states is under good control. She is on a statin for hypercholesterolemia. She is also treated for hypertension. These medications have been manipulated recently secondary to hypotension. She has a history of atrial fibrillation and has been on anticoagulation. This has been discontinued because of frequent falls.  The patient recently suffered a fall where she broke multiple ribs. She continued to have several falls which has been thought to be secondary to narcotic medication to treat her rib fractures. There is also concern that this may be hypotension. She has a sacral decubitus and is currently in the rehabilitation facility at a skilled nursing facility. She does ambulate on her own but this has been limited recently with her frequent falls. Prior to being in the nursing facility she lived by herself. Her husband died back in 11/25/22.  Hospital Course:  The patient was admitted to the hospital and initial labs revealed an elevated creatinine of 2.8.  Repeat labs that afternoon were 0.77 and 0.84.  She was admitted to the hospital to undergo aortogram with runoff the next day.  Her creatinine remained normal the next morning, which the initial creatinine was  a false reading.    She was taken to the [facility] on 08/27/2015 and underwent: 1. Left common femoral artery cannulation under ultrasound guidance 2. Placement of catheter in aorta 3. Aortogram 4. Second order arterial selection 5. Right leg runoff via catheter 6. Left leg runoff via sheath    The pt tolerated the procedure well and was transported to the PACU in good condition.   She is discharged back to the SNF with plans for cardiology clearance for possible surgical intervention.  The remainder of the hospital course consisted of increasing mobilization and increasing intake of solids without difficulty.  CBC    Component Value Date/Time   WBC 16.3* 08/27/2015 1146   RBC 3.46* 08/27/2015 1146   HGB 12.2 08/27/2015 1146   HCT 38.0 08/27/2015 1146   PLT 900* 08/27/2015 1146   MCV 109.8* 08/27/2015 1146   MCH 35.3* 08/27/2015 1146   MCHC 32.1 08/27/2015 1146   RDW 23.5* 08/27/2015 1146   LYMPHSABS 2.9 01/26/2015 1248   MONOABS 2.6* 01/26/2015 1248   EOSABS 0.3 01/26/2015 1248   BASOSABS 0.0 01/26/2015 1248    BMET    Component Value Date/Time   NA 133* 08/27/2015 0330   K 3.9 08/27/2015 0330   CL 97* 08/27/2015 0330   CO2 28 08/27/2015 0330   GLUCOSE 159* 08/27/2015 0330   BUN 7 08/27/2015 0330   CREATININE 0.76 08/27/2015 1146   CALCIUM 8.7* 08/27/2015 0330   GFRNONAA >60 08/27/2015 1146   GFRAA >60 08/27/2015 1146      Discharge Instructions    Call  MD for:  redness, tenderness, or signs of infection (pain, swelling, bleeding, redness, odor or green/yellow discharge around incision site)    Complete by:  As directed      Call MD for:  severe or increased pain, loss or decreased feeling  in affected limb(s)    Complete by:  As directed      Call MD for:  temperature >100.5    Complete by:  As directed      Discharge patient    Complete by:  As directed      Discharge wound care:    Complete by:  As directed   Dry dressing to right foot wrapped with  kerlix daily     Resume previous diet    Complete by:  As directed            Discharge Diagnosis:  pvd w right foot ulcer claudication  Secondary Diagnosis: Patient Active Problem List   Diagnosis Date Noted  . Pressure ulcer 08/27/2015  . PAD (peripheral artery disease) ([place]) 08/26/2015  . Seizure ([place]) 01/26/2015  . Atrial fibrillation with rapid ventricular response ([place]) 01/26/2015  . Phlebitis   . Screen for STD (sexually transmitted disease)   . Essential thrombocythemia ([place])   . Myelofibrosis ([place])   . UTI (urinary tract infection) 09/08/2014  . Atrial fibrillation with RVR ([place]) 09/07/2014  . Cellulitis of left upper arm 09/07/2014  . Paroxysmal atrial fibrillation ([place]) 04/08/2013  . Essential hypertension 04/08/2013  . CAFL (chronic airflow limitation) ([place]) 05/11/2012  . Essential hemorrhagic thrombocythemia ([place]) 05/11/2012  . Clinical depression 07/13/2011  . Diabetes ([place]) 07/13/2011  . DM2 (diabetes mellitus, type 2) ([place]) 06/19/2009  . Dyslipidemia 06/19/2009  . CORONARY ATHEROSCLEROSIS NATIVE CORONARY ARTERY 06/19/2009   Past Medical History  Diagnosis Date  . Depression   . Essential hypertension, benign   . Hypercholesteremia   . Insulin dependent diabetes mellitus ([place])   . Essential thrombocytosis ([facility])     JAK2 negative - followed by Dr. Owens Loffler  . Coronary atherosclerosis of native coronary artery     Multivessel status post CABG in [place]  . Atrial fibrillation ([place])   . Myelofibrosis ([place])        Medication List    TAKE these medications        acetaminophen 325 MG tablet  Commonly known as:  TYLENOL  Take 650 mg by mouth every 4 (four) hours as needed for mild pain.     aspirin EC 81 MG tablet  Take 81 mg by mouth daily.     atorvastatin 40 MG tablet  Commonly known as:  LIPITOR  Take 1 tablet (40 mg total) by mouth daily at 6 PM.     CARTIA XT 120 MG 24 hr capsule  Generic drug:  diltiazem  Take 120 mg by mouth daily.       cephALEXin 500 MG capsule  Commonly known as:  KEFLEX  Take 1 capsule (500 mg total) by mouth 3 (three) times daily.     fentaNYL 25 MCG/HR patch  Commonly known as:  DURAGESIC - dosed mcg/hr  Place 25 mcg onto the skin every 3 (three) days.     furosemide 20 MG tablet  Commonly known as:  LASIX  Take 20 mg by mouth.     hydroxyurea 500 MG capsule  Commonly known as:  HYDREA  Take 500 mg by mouth daily.     insulin NPH-regular Human (70-30) 100 UNIT/ML injection  Commonly known as:  NOVOLIN 70/30  Inject 10 Units into the skin 2 (two) times daily with a meal.     MELATIN PO  Take 3 mg by mouth at bedtime.     metoprolol succinate 25 MG 24 hr tablet  Commonly known as:  TOPROL-XL  Take 25 mg by mouth daily.     NOVOLOG MIX 70/30 FLEXPEN (70-30) 100 UNIT/ML FlexPen  Generic drug:  insulin aspart protamine - aspart  Inject 15 Units into the skin 2 (two) times daily.     oxyCODONE-acetaminophen 5-325 MG tablet  Commonly known as:  PERCOCET/ROXICET  Take 1 tablet by mouth every 4 (four) hours as needed for severe pain.     OXYGEN  Inhale 2 L into the lungs.     PROAIR HFA 108 (90 Base) MCG/ACT inhaler  Generic drug:  albuterol  Inhale 2 puffs into the lungs every 6 (six) hours as needed for wheezing or shortness of breath.     rivaroxaban 20 MG Tabs tablet  Commonly known as:  XARELTO  Take 1 tablet (20 mg total) by mouth daily after supper.     sertraline 50 MG tablet  Commonly known as:  ZOLOFT  Take 50 mg by mouth daily.     traMADol 50 MG tablet  Commonly known as:  ULTRAM  Take 50 mg by mouth every 6 (six) hours as needed for moderate pain.        New Meds: Keflex 500mg  tid  Rx given: Percocet 5/325 #30 (thirty) NF  Instructions: 1.  Dry dressing and kerlix to right foot daily.  Disposition: SNF  Patient's condition: is Good  Follow up: 1.  Pt will need cardiac clearance for possible surgical intervention RLE. 2.  Our office will coordinate  this.   Leontine Locket, PA-C [facility] [phone] 08/27/2015  1:12 PM

## 2015-08-27 NOTE — Progress Notes (Signed)
CSW received consult that pt is ready to DC back to SNF today- confirmed that pt is from [facility] and they can accept her back today  CSW will continue to follow.  Domenica Reamer, [place] Social Worker [phone]

## 2015-08-27 NOTE — Interval H&P Note (Signed)
History and Physical Interval Note:  08/27/2015 9:22 AM  Theresa Bird  has presented today for surgery, with the diagnosis of claudication  The various methods of treatment have been discussed with the patient and family. After consideration of risks, benefits and other options for treatment, the patient has consented to  Procedure(s): Abdominal Aortogram w/Lower Extremity (N/A) as a surgical intervention .  The patient's history has been reviewed, patient examined, no change in status, stable for surgery.  I have reviewed the patient's chart and labs.  Questions were answered to the patient's satisfaction.     Adele Barthel

## 2015-08-27 NOTE — Op Note (Signed)
OPERATIVE NOTE   PROCEDURE: 1.  Left common femoral artery cannulation under ultrasound guidance 2.  Placement of catheter in aorta 3.  Aortogram 4.  Second order arterial selection 5.  Right leg runoff via catheter 6.  Left leg runoff via sheath  PRE-OPERATIVE DIAGNOSIS: right foot ulcer  POST-OPERATIVE DIAGNOSIS: same as above   SURGEON: Adele Barthel, MD  ANESTHESIA: conscious sedation  ESTIMATED BLOOD LOSS: 50 cc  CONTRAST: 90 cc  FINDING(S):  Aorta: patent, calcified throughout  Superior mesenteric artery: patent Celiac artery: patent   Right Left  RA patent patent  CIA Patent, heavily calcified Patent, heavily calcified  EIA Patent, heavily calcified Patent, heavily calcified  IIA Patent, heavily calcified Patent, heavily calcified  CFA Patent, heavily calcified Patent, heavily calcified  SFA Patent proximally, heavily calcified, occluded distally Patent, heavily calcified, distally 50% stenosis  PFA Patent, extensive collaterals  Patent  Pop Reconstitutes at level of knee joint Patent  Trif Patent Patent  AT Patent, proximal stenosis >90%, only runoff to foot Patent briefly at takeoff, then occludes  Pero Patent briefly at takeoff, then occludes Patent, only runoff to foot  PT Patent briefly at takeoff, then occludes Patent briefly at takeoff, then occludes   SPECIMEN(S):  none  INDICATIONS:   Theresa Bird is a 82 y.o. female who presents with right foot ulcer.  The patient presents for: aortogram, bilateral leg runoff, and possible right leg intervention.  I discussed with the patient the nature of angiographic procedures, especially the limited patencies of any endovascular intervention.  The patient is aware of that the risks of an angiographic procedure include but are not limited to: bleeding, infection, access site complications, renal failure, embolization, rupture of vessel, dissection, possible need for emergent surgical intervention, possible need  for surgical procedures to treat the patient's pathology, and stroke and death.  The patient is aware of the risks and agrees to proceed.  DESCRIPTION: After full informed consent was obtained from the patient, the patient was brought back to the angiography suite.  The patient was placed supine upon the angiography table and connected to cardiopulmonary monitoring equipment.  The patient was then given conscious sedation, the amounts of which are documented in the patient's chart.  The patient was prepped and drape in the standard fashion for an angiographic procedure.  At this point, attention was turned to the left groin.  Under ultrasound guidance, the subcutaneous tissue surrounding the left common femoral artery was anesthesized with 1% lidocaine with epinephrine.  The artery was then cannulated with a micropuncture needle.  The microwire was advanced into the iliac arterial system.  The needle was exchanged for a microsheath, which was loaded into the common femoral artery over the wire.  The microwire was exchanged for a [facility] wire which was advanced into the aorta.  The microsheath was then exchanged for a 5-Fr sheath which was loaded into the common femoral artery.  The Omniflush catheter was then loaded over the wire up to the level of L1.  The catheter was connected to the power injector circuit.  After de-airring and de-clotting the circuit, a powerinjector aortogram was completed.  The findings are listed above.   The [facility] wire was replaced in the catheter, and using the Bentson and Omniflush catheter, the right common iliac artery was selected.  The catheter and wire were advanced into the external iliac artery.  An automated right leg runoff was completed after de-airring and de-clotting the circuit.  The findings are  as listed above.  The Bentson wire was replaced in the catheter to straighten the crook of the catheter.  Both were removed together from the sheath.  The left sheath was  aspirated and no clot was present.  The sheath was connected to the power injector circuit.  An automated left leg runoff was completed after de-airring and de-clotting the circuit.  The findings are as listed above.  The sheath was aspirated.  No clots were present and the sheath was reloaded with heparinized saline.    Given the severity of calcification in this patient, I doubt a subintimal technique is possible.  As the distal re-entry would be at the level of knee, this is not favorable in the event stenting is necessary.  Additionally, the severity of calcification may make a femoropopliteal bypass impossible.  The right greater saphenous vein has likely been harvested given the numerous clips present on the study of the right leg.  The proximal anterior tibial artery stenosis would also need to be addressed even if a femoropopliteal bypass was completed.   COMPLICATIONS: none  CONDITION: stable   Adele Barthel, MD [facility] [place] Office: [phone] Pager: [phone]  08/27/2015, 10:40 AM

## 2015-08-27 NOTE — Consult Note (Addendum)
WOC wound consult note Reason for Consult: Consult requested for sacrum and right foot wounds.  Pt states she is followed by the outpatient wound care center prior to admission for both of these sites, and they have ordered batadine to be applied to right foot wounds Q day.  Her ABI to right foot is inadequate and she is planning for revascularization procedure soon, according to the EMR and is followed by the vascular team. Wound type: Sacrum with unstageable pressure injury; 1X1X.2cm, 90% yellow slough, 10% red interspersed throughout.  No odor, mod amt tan drainage.  Located to sacrum/inner gluteal fold in an area which is difficult to keep from becoming constantly moist.  Surrounded by 3 cm generalized erythremia; appearance consistent with moisture associated skin damage. Pt wears adult incontinence briefs brought from home, educated that wearing these does not allow airflow to promote healing to the affected location. Pressure Ulcer POA: Yes Measurement: Left inner foot with dry callous; .3X.3cm, raised above skin level; no open wound, fluctuance or drainage.  No topical treatment required at this time. Right outer heel with full thickness wound; 2.5X1X.1cm, dry yellow wound bed, no odor or drainage.  Right outer foot with other wounds with same appearance; ankle .1X.1X.1cm, .8X.8X.1cm, anterior foot .3X.3X.1cm  Dressing procedure/placement/frequency: Continue present plan of care with betadine and kerlex to protect from further injury.  Santyl for enzymatic debridement of nonviable tissue to sacrum.  Pt can resume follow up at the outpatient wound care center after discharge. Discussed plan of care with family member and patient. Please re-consult if further assistance is needed.  Thank-you,  Julien Girt MSN, [place], [place], [place], [place]

## 2015-08-27 NOTE — Progress Notes (Signed)
  Progress Note    08/27/2015 7:36 AM Hospital Day 1  Subjective:  States she had a good night  Afebrile HR  70's-90's NSR [id number] systolic [id number] RA  Filed Vitals:   08/26/15 2027 08/27/15 0656  BP: 127/69 126/67  Pulse: 93 91  Temp: 98.8 F (37.1 C) 98.1 F (36.7 C)  Resp: 18 18    Physical Exam: Lungs:  Non labored Extremities:  Right foot with several ulcers without drainage.  Right great toe per pt is less red today.  No drainage from right great toe today.  CBC    Component Value Date/Time   WBC 15.0* 08/27/2015 0330   RBC 3.25* 08/27/2015 0330   HGB 11.7* 08/27/2015 0330   HCT 36.1 08/27/2015 0330   PLT 845* 08/27/2015 0330   MCV 111.1* 08/27/2015 0330   MCH 36.0* 08/27/2015 0330   MCHC 32.4 08/27/2015 0330   RDW 23.2* 08/27/2015 0330   LYMPHSABS 2.9 01/26/2015 1248   MONOABS 2.6* 01/26/2015 1248   EOSABS 0.3 01/26/2015 1248   BASOSABS 0.0 01/26/2015 1248    BMET    Component Value Date/Time   NA 133* 08/27/2015 0330   K 3.9 08/27/2015 0330   CL 97* 08/27/2015 0330   CO2 28 08/27/2015 0330   GLUCOSE 159* 08/27/2015 0330   BUN 7 08/27/2015 0330   CREATININE 0.84 08/27/2015 0330   CALCIUM 8.7* 08/27/2015 0330   GFRNONAA >60 08/27/2015 0330   GFRAA >60 08/27/2015 0330    INR    Component Value Date/Time   INR 1.34 08/27/2015 0330     Intake/Output Summary (Last 24 hours) at 08/27/15 0736 Last data filed at 08/27/15 0657  Gross per 24 hour  Intake      0 ml  Output   1950 ml  Net  -1950 ml     Assessment/Plan:  82 y.o. female who was supposed to undergo arteriography yesterday, however, her creatinine had jumped.  Repeat labs revealed normal creatinine.  Repeat labs this morning revealed a normal creatinine. Hospital Day 1  -Given the repeat labs, the elevated creatinine of 2.8 was inaccurate -will send her back to the SNF today with po Keflex and do her aortogram early next week.  Our office will contact her to schedule this.    -her right foot ulcers are dry; she reported purulent drainage from right great toe and erythema, but that is improved this morning and there is no drainage.   -there is a possibility of one of his partner's performing the aortogram today-talked to pt and she has only had a couple of sips of water this morning.  She understands not to have anything else in case this can be done.   Leontine Locket, PA-C [facility] [phone] 08/27/2015 7:36 AM

## 2015-08-28 ENCOUNTER — Other Ambulatory Visit: Payer: Self-pay

## 2015-08-28 ENCOUNTER — Encounter (HOSPITAL_COMMUNITY): Payer: Self-pay | Admitting: Vascular Surgery

## 2015-08-28 MED FILL — Heparin Sodium (Porcine) 2 Unit/ML in Sodium Chloride 0.9%: INTRAMUSCULAR | Qty: 1000 | Status: AC

## 2015-09-02 ENCOUNTER — Encounter: Payer: Self-pay | Admitting: Surgery

## 2015-09-04 ENCOUNTER — Encounter (HOSPITAL_COMMUNITY): Payer: Self-pay | Admitting: Neurology

## 2015-09-04 ENCOUNTER — Inpatient Hospital Stay (HOSPITAL_COMMUNITY)
Admission: EM | Admit: 2015-09-04 | Discharge: 2015-09-07 | DRG: 481 | Disposition: A | Discharge status: Skilled Nursing Facility | Payer: Medicare Other | Attending: Internal Medicine | Admitting: Internal Medicine

## 2015-09-04 ENCOUNTER — Emergency Department (HOSPITAL_COMMUNITY): Payer: Medicare Other

## 2015-09-04 DIAGNOSIS — J449 Chronic obstructive pulmonary disease, unspecified: Secondary | ICD-10-CM | POA: Diagnosis present

## 2015-09-04 DIAGNOSIS — I739 Peripheral vascular disease, unspecified: Secondary | ICD-10-CM | POA: Insufficient documentation

## 2015-09-04 DIAGNOSIS — D7581 Myelofibrosis: Secondary | ICD-10-CM | POA: Diagnosis present

## 2015-09-04 DIAGNOSIS — S72092A Other fracture of head and neck of left femur, initial encounter for closed fracture: Secondary | ICD-10-CM | POA: Diagnosis not present

## 2015-09-04 DIAGNOSIS — L97522 Non-pressure chronic ulcer of other part of left foot with fat layer exposed: Secondary | ICD-10-CM | POA: Diagnosis present

## 2015-09-04 DIAGNOSIS — L97519 Non-pressure chronic ulcer of other part of right foot with unspecified severity: Secondary | ICD-10-CM

## 2015-09-04 DIAGNOSIS — W010XXA Fall on same level from slipping, tripping and stumbling without subsequent striking against object, initial encounter: Secondary | ICD-10-CM | POA: Diagnosis present

## 2015-09-04 DIAGNOSIS — E1151 Type 2 diabetes mellitus with diabetic peripheral angiopathy without gangrene: Secondary | ICD-10-CM | POA: Diagnosis present

## 2015-09-04 DIAGNOSIS — I2510 Atherosclerotic heart disease of native coronary artery without angina pectoris: Secondary | ICD-10-CM | POA: Diagnosis present

## 2015-09-04 DIAGNOSIS — F329 Major depressive disorder, single episode, unspecified: Secondary | ICD-10-CM | POA: Diagnosis present

## 2015-09-04 DIAGNOSIS — Z951 Presence of aortocoronary bypass graft: Secondary | ICD-10-CM

## 2015-09-04 DIAGNOSIS — E11621 Type 2 diabetes mellitus with foot ulcer: Secondary | ICD-10-CM | POA: Diagnosis present

## 2015-09-04 DIAGNOSIS — Z794 Long term (current) use of insulin: Secondary | ICD-10-CM | POA: Diagnosis not present

## 2015-09-04 DIAGNOSIS — I480 Paroxysmal atrial fibrillation: Secondary | ICD-10-CM | POA: Diagnosis present

## 2015-09-04 DIAGNOSIS — R0600 Dyspnea, unspecified: Secondary | ICD-10-CM

## 2015-09-04 DIAGNOSIS — L97322 Non-pressure chronic ulcer of left ankle with fat layer exposed: Secondary | ICD-10-CM | POA: Diagnosis present

## 2015-09-04 DIAGNOSIS — E785 Hyperlipidemia, unspecified: Secondary | ICD-10-CM | POA: Diagnosis present

## 2015-09-04 DIAGNOSIS — S72142A Displaced intertrochanteric fracture of left femur, initial encounter for closed fracture: Principal | ICD-10-CM | POA: Diagnosis present

## 2015-09-04 DIAGNOSIS — I10 Essential (primary) hypertension: Secondary | ICD-10-CM | POA: Diagnosis present

## 2015-09-04 DIAGNOSIS — Z7901 Long term (current) use of anticoagulants: Secondary | ICD-10-CM | POA: Diagnosis not present

## 2015-09-04 DIAGNOSIS — L89150 Pressure ulcer of sacral region, unstageable: Secondary | ICD-10-CM | POA: Diagnosis present

## 2015-09-04 DIAGNOSIS — S72002A Fracture of unspecified part of neck of left femur, initial encounter for closed fracture: Secondary | ICD-10-CM

## 2015-09-04 DIAGNOSIS — Z419 Encounter for procedure for purposes other than remedying health state, unspecified: Secondary | ICD-10-CM

## 2015-09-04 DIAGNOSIS — Z7982 Long term (current) use of aspirin: Secondary | ICD-10-CM | POA: Diagnosis not present

## 2015-09-04 DIAGNOSIS — G40109 Localization-related (focal) (partial) symptomatic epilepsy and epileptic syndromes with simple partial seizures, not intractable, without status epilepticus: Secondary | ICD-10-CM | POA: Diagnosis present

## 2015-09-04 DIAGNOSIS — E119 Type 2 diabetes mellitus without complications: Secondary | ICD-10-CM

## 2015-09-04 DIAGNOSIS — E7800 Pure hypercholesterolemia, unspecified: Secondary | ICD-10-CM | POA: Diagnosis present

## 2015-09-04 DIAGNOSIS — W1809XA Striking against other object with subsequent fall, initial encounter: Secondary | ICD-10-CM | POA: Diagnosis not present

## 2015-09-04 DIAGNOSIS — W01198D Fall on same level from slipping, tripping and stumbling with subsequent striking against other object, subsequent encounter: Secondary | ICD-10-CM | POA: Diagnosis not present

## 2015-09-04 DIAGNOSIS — L97422 Non-pressure chronic ulcer of left heel and midfoot with fat layer exposed: Secondary | ICD-10-CM | POA: Diagnosis present

## 2015-09-04 DIAGNOSIS — L8990 Pressure ulcer of unspecified site, unspecified stage: Secondary | ICD-10-CM | POA: Diagnosis present

## 2015-09-04 DIAGNOSIS — E08621 Diabetes mellitus due to underlying condition with foot ulcer: Secondary | ICD-10-CM | POA: Insufficient documentation

## 2015-09-04 DIAGNOSIS — S72092D Other fracture of head and neck of left femur, subsequent encounter for closed fracture with routine healing: Secondary | ICD-10-CM | POA: Diagnosis not present

## 2015-09-04 DIAGNOSIS — D473 Essential (hemorrhagic) thrombocythemia: Secondary | ICD-10-CM | POA: Diagnosis present

## 2015-09-04 DIAGNOSIS — Z9181 History of falling: Secondary | ICD-10-CM | POA: Diagnosis not present

## 2015-09-04 DIAGNOSIS — M25552 Pain in left hip: Secondary | ICD-10-CM | POA: Diagnosis present

## 2015-09-04 LAB — BASIC METABOLIC PANEL
Anion gap: 8 (ref 5–15)
BUN: 16 mg/dL (ref 6–20)
CO2: 31 mmol/L (ref 22–32)
CREATININE: 0.89 mg/dL (ref 0.44–1.00)
Calcium: 8.9 mg/dL (ref 8.9–10.3)
Chloride: 96 mmol/L — ABNORMAL LOW (ref 101–111)
GFR calc Af Amer: >60 mL/min (ref >60)
GFR, EST NON AFRICAN AMERICAN: 60 mL/min — AB (ref >60)
GLUCOSE: 128 mg/dL — AB (ref 65–99)
Potassium: 4.5 mmol/L (ref 3.5–5.1)
SODIUM: 135 mmol/L (ref 135–145)

## 2015-09-04 LAB — CBC WITH DIFFERENTIAL/PLATELET
BASOS ABS: 0.2 10*3/uL — AB (ref 0.0–0.1)
Basophils Relative: 1 %
Eosinophils Absolute: 0.4 10*3/uL (ref 0.0–0.7)
Eosinophils Relative: 2 %
HEMATOCRIT: 37.7 % (ref 36.0–46.0)
Hemoglobin: 12.4 g/dL (ref 12.0–15.0)
LYMPHS ABS: 1.1 10*3/uL (ref 0.7–4.0)
Lymphocytes Relative: 6 %
MCH: 37.2 pg — ABNORMAL HIGH (ref 26.0–34.0)
MCHC: 32.9 g/dL (ref 30.0–36.0)
MCV: 113.2 fL — AB (ref 78.0–100.0)
MONO ABS: 1.1 10*3/uL — AB (ref 0.1–1.0)
Monocytes Relative: 6 %
NEUTROS ABS: 15.8 10*3/uL — AB (ref 1.7–7.7)
Neutrophils Relative %: 85 %
PLATELETS: 795 10*3/uL — AB (ref 150–400)
RBC: 3.33 MIL/uL — AB (ref 3.87–5.11)
RDW: 20.9 % — AB (ref 11.5–15.5)
WBC: 18.6 10*3/uL — AB (ref 4.0–10.5)

## 2015-09-04 LAB — GLUCOSE, CAPILLARY: GLUCOSE-CAPILLARY: 212 mg/dL — AB (ref 65–99)

## 2015-09-04 LAB — PROTIME-INR
INR: 1.41 (ref 0.00–1.49)
Prothrombin Time: 17.3 seconds — ABNORMAL HIGH (ref 11.6–15.2)

## 2015-09-04 LAB — TYPE AND SCREEN
ABO/RH(D): A POS
ANTIBODY SCREEN: NEGATIVE

## 2015-09-04 LAB — ABO/RH: ABO/RH(D): A POS

## 2015-09-04 MED ORDER — ENOXAPARIN SODIUM 40 MG/0.4ML ~~LOC~~ SOLN: 40.0000 mg | SUBCUTANEOUS | Status: DC

## 2015-09-04 MED ORDER — SODIUM CHLORIDE 0.45 % IV SOLN
INTRAVENOUS | Status: DC
  Administered 2015-09-04: 22:00:00 via INTRAVENOUS

## 2015-09-04 MED ORDER — HYDROMORPHONE HCL 1 MG/ML IJ SOLN
0.5000 mg | Freq: Once | INTRAMUSCULAR | Status: AC
  Administered 2015-09-04: 0.5 mg via INTRAVENOUS
  Filled 2015-09-04: qty 1

## 2015-09-04 MED ORDER — DEXTROSE-NACL 5-0.45 % IV SOLN: INTRAVENOUS | Status: DC

## 2015-09-04 MED ORDER — INSULIN ASPART 100 UNIT/ML ~~LOC~~ SOLN
0.0000 [IU] | Freq: Three times a day (TID) | SUBCUTANEOUS | Status: DC
  Administered 2015-09-05 – 2015-09-06 (×4): 2 [IU] via SUBCUTANEOUS
  Administered 2015-09-06: 3 [IU] via SUBCUTANEOUS
  Administered 2015-09-07: 2 [IU] via SUBCUTANEOUS

## 2015-09-04 MED ORDER — CEFAZOLIN SODIUM-DEXTROSE 2-4 GM/100ML-% IV SOLN
2.0000 g | INTRAVENOUS | Status: AC
  Administered 2015-09-05: 2 g via INTRAVENOUS
  Filled 2015-09-04 (×2): qty 100

## 2015-09-04 MED ORDER — METOPROLOL SUCCINATE ER 25 MG PO TB24
25.0000 mg | ORAL_TABLET | Freq: Every day | ORAL | Status: DC
  Administered 2015-09-06 – 2015-09-07 (×2): 25 mg via ORAL
  Filled 2015-09-04 (×2): qty 1

## 2015-09-04 MED ORDER — INSULIN ASPART 100 UNIT/ML ~~LOC~~ SOLN
0.0000 [IU] | Freq: Every day | SUBCUTANEOUS | Status: DC
  Administered 2015-09-04: 2 [IU] via SUBCUTANEOUS

## 2015-09-04 MED ORDER — DILTIAZEM HCL ER COATED BEADS 120 MG PO CP24
120.0000 mg | ORAL_CAPSULE | Freq: Every day | ORAL | Status: DC
  Administered 2015-09-06 – 2015-09-07 (×2): 120 mg via ORAL
  Filled 2015-09-04 (×2): qty 1

## 2015-09-04 MED ORDER — MORPHINE SULFATE (PF) 2 MG/ML IV SOLN
1.0000 mg | INTRAVENOUS | Status: DC | PRN
  Administered 2015-09-04 – 2015-09-05 (×4): 1 mg via INTRAVENOUS
  Filled 2015-09-04 (×4): qty 1

## 2015-09-04 MED ORDER — ALBUTEROL SULFATE (2.5 MG/3ML) 0.083% IN NEBU: 2.5000 mg | INHALATION_SOLUTION | Freq: Four times a day (QID) | RESPIRATORY_TRACT | Status: DC | PRN

## 2015-09-04 NOTE — ED Notes (Signed)
Per ems- Pt comes from the [facility] in [place], tripped over wheelchair and landed on her left hip. C/o left hip pain, reports shortening and rotation of left leg. Given 6 mg morphine for pain. Pt is a x 4.

## 2015-09-04 NOTE — ED Provider Notes (Signed)
CSN: [id number]     Arrival date & time 09/04/15  30 History   First MD Initiated Contact with Patient 09/04/15 1336     Chief Complaint  Patient presents with  . Hip Pain      HPI  Expand All Collapse All   Per ems- Pt comes from the [facility] in [place], tripped over wheelchair and landed on her left hip. C/o left hip pain, reports shortening and rotation of left leg. Given 6 mg morphine for pain        Past Medical History  Diagnosis Date  . Depression   . Essential hypertension, benign   . Hypercholesteremia   . Insulin dependent diabetes mellitus ([place])   . Essential thrombocytosis (HCC)     JAK2 negative - followed by Dr. Owens Loffler  . Coronary atherosclerosis of native coronary artery     Multivessel status post CABG in [place]  . Atrial fibrillation ([place])   . Myelofibrosis [facility])    Past Surgical History  Procedure Laterality Date  . Abdominal hysterectomy    . Cataract extraction    . Coronary artery bypass graft      Possibly 2002 in [place]  . Cardioversion N/A 09/09/2014    Procedure: CARDIOVERSION;  Surgeon: Josue Hector, MD;  Location: [facility] ENDOSCOPY;  Service: Cardiovascular;  Laterality: N/A;  . Cardioversion N/A 10/03/2014    Procedure: CARDIOVERSION;  Surgeon: Arnoldo Lenis, MD;  Location: [facility] ORS;  Service: Endoscopy;  Laterality: N/A;  . Peripheral vascular catheterization N/A 08/27/2015    Procedure: Abdominal Aortogram w/Lower Extremity;  Surgeon: Conrad Leith-Hatfield, MD;  Location: [facility];  Service: Cardiovascular;  Laterality: N/A;   Family History  Problem Relation Age of Onset  . Coronary artery disease     Social History  Substance Use Topics  . Smoking status: Never Smoker   . Smokeless tobacco: Never Used  . Alcohol Use: 0.0 oz/week    0 Standard drinks or equivalent per week     Comment: Socially   OB History    No data available     Review of Systems    Allergies  Review of patient's allergies indicates no known  allergies.  Home Medications   Prior to Admission medications   Medication Sig Start Date End Date Taking? Authorizing Provider  acetaminophen (TYLENOL) 325 MG tablet Take 650 mg by mouth every 4 (four) hours as needed for mild pain.    Historical Provider, MD  albuterol (PROAIR HFA) 108 (90 BASE) MCG/ACT inhaler Inhale 2 puffs into the lungs every 6 (six) hours as needed for wheezing or shortness of breath.    Historical Provider, MD  aspirin EC 81 MG tablet Take 81 mg by mouth daily.    Historical Provider, MD  atorvastatin (LIPITOR) 40 MG tablet Take 1 tablet (40 mg total) by mouth daily at 6 PM. Patient taking differently: Take 40 mg by mouth daily.  01/30/15   Virginia Crews, MD  cephALEXin (KEFLEX) 500 MG capsule Take 1 capsule (500 mg total) by mouth 3 (three) times daily. 08/27/15   Samantha J Rhyne, PA-C  diltiazem (CARTIA XT) 120 MG 24 hr capsule Take 120 mg by mouth daily.    Historical Provider, MD  fentaNYL (DURAGESIC - DOSED MCG/HR) 25 MCG/HR patch Place 25 mcg onto the skin every 3 (three) days.    Historical Provider, MD  furosemide (LASIX) 20 MG tablet Take 20 mg by mouth.    Historical Provider, MD  hydroxyurea (HYDREA) 500 MG capsule Take 500 mg by mouth daily.  04/14/14   Historical Provider, MD  insulin aspart protamine - aspart (NOVOLOG MIX 70/30 FLEXPEN) (70-30) 100 UNIT/ML FlexPen Inject 15 Units into the skin 2 (two) times daily.    Historical Provider, MD  insulin NPH-regular Human (NOVOLIN 70/30) (70-30) 100 UNIT/ML injection Inject 10 Units into the skin 2 (two) times daily with a meal.    Historical Provider, MD  Melatonin-Pyridoxine (MELATIN PO) Take 3 mg by mouth at bedtime.    Historical Provider, MD  metoprolol succinate (TOPROL-XL) 25 MG 24 hr tablet Take 25 mg by mouth daily.    Historical Provider, MD  oxyCODONE-acetaminophen (PERCOCET/ROXICET) 5-325 MG tablet Take 1 tablet by mouth every 4 (four) hours as needed for severe pain. 08/27/15   Samantha J Rhyne,  PA-C  OXYGEN Inhale 2 L into the lungs.    Historical Provider, MD  rivaroxaban (XARELTO) 20 MG TABS tablet Take 1 tablet (20 mg total) by mouth daily after supper. Patient not taking: Reported on 08/26/2015 09/11/14   Evelene Croon Barrett, PA-C  sertraline (ZOLOFT) 50 MG tablet Take 50 mg by mouth daily.    Historical Provider, MD  traMADol (ULTRAM) 50 MG tablet Take 50 mg by mouth every 6 (six) hours as needed for moderate pain.    Historical Provider, MD   BP 115/69 mmHg  Pulse 97  Temp(Src) 98.1 F (36.7 C) (Oral)  Resp 19  SpO2 92% Physical Exam  Constitutional: She is oriented to person, place, and time. She appears well-developed and well-nourished. No distress.  HENT:  Head: Normocephalic and atraumatic.  Eyes: Pupils are equal, round, and reactive to light.  Neck: Normal range of motion.  Cardiovascular: Normal rate and intact distal pulses.   Pulmonary/Chest: No respiratory distress.  Abdominal: Normal appearance. She exhibits no distension.  Musculoskeletal: She exhibits tenderness.       Left hip: She exhibits decreased range of motion, tenderness, bony tenderness and deformity.  Neurological: She is alert and oriented to person, place, and time. No cranial nerve deficit.  Skin: Skin is warm and dry. No rash noted.  Psychiatric: She has a normal mood and affect. Her behavior is normal.  Nursing note and vitals reviewed.   ED Course  Procedures (including critical care time) Medications  HYDROmorphone (DILAUDID) injection 0.5 mg (0.5 mg Intravenous Given 09/04/15 1519)    Labs Review Labs Reviewed  BASIC METABOLIC PANEL - Abnormal; Notable for the following:    Chloride 96 (*)    Glucose, Bld 128 (*)    GFR calc non Af Amer 60 (*)    All other components within normal limits  CBC WITH DIFFERENTIAL/PLATELET - Abnormal; Notable for the following:    WBC 18.6 (*)    RBC 3.33 (*)    MCV 113.2 (*)    MCH 37.2 (*)    RDW 20.9 (*)    Platelets 795 (*)    Neutro Abs 15.8  (*)    Monocytes Absolute 1.1 (*)    Basophils Absolute 0.2 (*)    All other components within normal limits    Imaging Review Dg Chest Portable 1 View  09/04/2015  CLINICAL DATA:  Left hip fracture. Pre-op respiratory exam diabetes, atrial fibrillation, and coronary artery disease. EXAM: PORTABLE CHEST 1 VIEW COMPARISON:  07/23/2015 FINDINGS: Heart size is within normal limits. Both lungs are clear. No evidence of pneumothorax or pleural effusion. Lordotic positioning noted. Previous median sternotomy noted. IMPRESSION: No active  disease. Electronically Signed   By: Earle Gell M.D.   On: 09/04/2015 15:15   Dg Hip Unilat With Pelvis 2-3 Views Left  09/04/2015  CLINICAL DATA:  Fall with left hip pain EXAM: DG HIP (WITH OR WITHOUT PELVIS) 2-3V LEFT COMPARISON:  02/25/2010 CT abdomen/pelvis FINDINGS: There is a markedly comminuted intertrochanteric left proximal femur fracture with mild apex lateral angulation and no significant displacement. No additional fracture. No dislocation at the hip joints. Mild osteoarthritis in both hip joints. Degenerative changes in the visualized lower lumbar spine. IMPRESSION: Markedly comminuted intratrochanteric left proximal femur fracture. Electronically Signed   By: Ilona Sorrel M.D.   On: 09/04/2015 14:55   I have personally reviewed and evaluated these images and lab results as part of my medical decision-making.    MDM   Final diagnoses:  Hip fracture, left, closed, initial encounter (HCC)       Leonard Schwartz, MD 09/04/15 1624

## 2015-09-04 NOTE — ED Notes (Signed)
Pt placed on 2 L [place].  Pt sts she wear oxygen at home, mostly at night, but sometimes during the day.

## 2015-09-04 NOTE — Consult Note (Signed)
ORTHOPAEDIC CONSULTATION  REQUESTING PHYSICIAN: Annia Belt, MD  Chief Complaint: Left hip pain  HPI: Theresa Bird is a 82 y.o. female who presents with a left comminuted intertrochanteric hip fracture. Patient states that she fell while at the [facility] sustaining the left intertrochanteric hip fracture when she hit the ground.  Past Medical History  Diagnosis Date  . Depression   . Essential hypertension, benign   . Hypercholesteremia   . Insulin dependent diabetes mellitus ([place])   . Essential thrombocytosis (HCC)     JAK2 negative - followed by Dr. Owens Loffler  . Coronary atherosclerosis of native coronary artery     Multivessel status post CABG in [place]  . Atrial fibrillation ([place])   . Myelofibrosis [facility])    Past Surgical History  Procedure Laterality Date  . Abdominal hysterectomy    . Cataract extraction    . Coronary artery bypass graft      Possibly 2002 in [place]  . Cardioversion N/A 09/09/2014    Procedure: CARDIOVERSION;  Surgeon: Josue Hector, MD;  Location: [facility] ENDOSCOPY;  Service: Cardiovascular;  Laterality: N/A;  . Cardioversion N/A 10/03/2014    Procedure: CARDIOVERSION;  Surgeon: Arnoldo Lenis, MD;  Location: [facility] ORS;  Service: Endoscopy;  Laterality: N/A;  . Peripheral vascular catheterization N/A 08/27/2015    Procedure: Abdominal Aortogram w/Lower Extremity;  Surgeon: Conrad Benns Church, MD;  Location: [place] [facility] LAB;  Service: Cardiovascular;  Laterality: N/A;   Social History   Social History  . Marital Status: Widowed    Spouse Name: N/A  . Number of Children: N/A  . Years of Education: N/A   Occupational History  . Retired     Environmental manager   Social History Main Topics  . Smoking status: Never Smoker   . Smokeless tobacco: Never Used  . Alcohol Use: 0.0 oz/week    0 Standard drinks or equivalent per week     Comment: Socially  . Drug Use: No  . Sexual Activity: Not Asked   Other Topics Concern  . None   Social History  Narrative   Family History  Problem Relation Age of Onset  . Coronary artery disease     - negative except otherwise stated in the family history section No Known Allergies Prior to Admission medications   Medication Sig Start Date End Date Taking? Authorizing Provider  albuterol (PROAIR HFA) 108 (90 BASE) MCG/ACT inhaler Inhale 2 puffs into the lungs every 6 (six) hours as needed for wheezing or shortness of breath.   Yes Historical Provider, MD  aspirin EC 81 MG tablet Take 81 mg by mouth daily.   Yes Historical Provider, MD  atorvastatin (LIPITOR) 40 MG tablet Take 1 tablet (40 mg total) by mouth daily at 6 PM. Patient taking differently: Take 40 mg by mouth daily.  01/30/15  Yes Virginia Crews, MD  cephALEXin (KEFLEX) 500 MG capsule Take 1 capsule (500 mg total) by mouth 3 (three) times daily. 08/27/15  Yes Samantha J Rhyne, PA-C  diltiazem (CARTIA XT) 120 MG 24 hr capsule Take 120 mg by mouth daily.   Yes Historical Provider, MD  fentaNYL (DURAGESIC - DOSED MCG/HR) 25 MCG/HR patch Place 25 mcg onto the skin every 3 (three) days.   Yes Historical Provider, MD  furosemide (LASIX) 20 MG tablet Take 20 mg by mouth.   Yes Historical Provider, MD  hydroxyurea (HYDREA) 500 MG capsule Take 500 mg by mouth daily.  04/14/14  Yes Historical Provider,  MD  insulin aspart protamine - aspart (NOVOLOG MIX 70/30 FLEXPEN) (70-30) 100 UNIT/ML FlexPen Inject 15 Units into the skin 2 (two) times daily.   Yes Historical Provider, MD  insulin NPH-regular Human (NOVOLIN 70/30) (70-30) 100 UNIT/ML injection Inject 10 Units into the skin 2 (two) times daily with a meal.   Yes Historical Provider, MD  Melatonin-Pyridoxine (MELATIN PO) Take 3 mg by mouth at bedtime.   Yes Historical Provider, MD  metoprolol succinate (TOPROL-XL) 25 MG 24 hr tablet Take 25 mg by mouth daily.   Yes Historical Provider, MD  oxyCODONE-acetaminophen (PERCOCET/ROXICET) 5-325 MG tablet Take 1 tablet by mouth every 4 (four) hours as  needed for severe pain. 08/27/15  Yes Samantha J Rhyne, PA-C  rivaroxaban (XARELTO) 20 MG TABS tablet Take 20 mg by mouth daily with supper.   Yes Historical Provider, MD  sertraline (ZOLOFT) 50 MG tablet Take 50 mg by mouth daily.   Yes Historical Provider, MD  traMADol (ULTRAM) 50 MG tablet Take 50 mg by mouth every 6 (six) hours as needed for moderate pain.   Yes Historical Provider, MD  vitamin C (ASCORBIC ACID) 500 MG tablet Take 500 mg by mouth 2 (two) times daily.   Yes Historical Provider, MD  acetaminophen (TYLENOL) 325 MG tablet Take 650 mg by mouth every 4 (four) hours as needed for mild pain.    Historical Provider, MD  OXYGEN Inhale 2 L into the lungs.    Historical Provider, MD   Dg Chest Portable 1 View  09/04/2015  CLINICAL DATA:  Left hip fracture. Pre-op respiratory exam diabetes, atrial fibrillation, and coronary artery disease. EXAM: PORTABLE CHEST 1 VIEW COMPARISON:  07/23/2015 FINDINGS: Heart size is within normal limits. Both lungs are clear. No evidence of pneumothorax or pleural effusion. Lordotic positioning noted. Previous median sternotomy noted. IMPRESSION: No active disease. Electronically Signed   By: Earle Gell M.D.   On: 09/04/2015 15:15   Dg Hip Unilat With Pelvis 2-3 Views Left  09/04/2015  CLINICAL DATA:  Fall with left hip pain EXAM: DG HIP (WITH OR WITHOUT PELVIS) 2-3V LEFT COMPARISON:  02/25/2010 CT abdomen/pelvis FINDINGS: There is a markedly comminuted intertrochanteric left proximal femur fracture with mild apex lateral angulation and no significant displacement. No additional fracture. No dislocation at the hip joints. Mild osteoarthritis in both hip joints. Degenerative changes in the visualized lower lumbar spine. IMPRESSION: Markedly comminuted intratrochanteric left proximal femur fracture. Electronically Signed   By: Ilona Sorrel M.D.   On: 09/04/2015 14:55   - pertinent xrays, CT, MRI studies were reviewed and independently interpreted  Positive ROS:  All other systems have been reviewed and were otherwise negative with the exception of those mentioned in the HPI and as above.  Physical Exam: General: Alert, no acute distress Cardiovascular: No pedal edema Respiratory: No cyanosis, no use of accessory musculature GI: No organomegaly, abdomen is soft and non-tender Skin: No lesions in the area of chief complaint Neurologic: Sensation intact distally Psychiatric: Patient is competent for consent with normal mood and affect Lymphatic: No axillary or cervical lymphadenopathy  MUSCULOSKELETAL:  On examination patient's left lower extremity is shortened and externally rotated. Internal and external rotation is painful. Radiographs shows a comminuted intertrochanteric left hip fracture.  Assessment: Assessment: Comminuted intertrochanteric left hip fracture.  Plan: Plan: We will plan for intramedullary nail fixation of the left hip fracture. Nothing by mouth after midnight. Risk and benefits of surgery were discussed including infection neurovascular injury failure fixation need for additional  surgery. Patient states she understands wishes to proceed at this time she states that her daughter will be with her tonight in the hospital.  Thank you for the consult and the opportunity to see Ms. Clovis Pu, [place] [phone] 6:01 PM

## 2015-09-04 NOTE — H&P (Signed)
Date: 09/04/2015               Patient Name:  Theresa Bird MRN: [id number]  DOB: 1933/07/30 Age / Sex: 82 y.o., female   PCP: Neale Burly, MD         Medical Service: Internal Medicine Teaching Service         Attending Physician: Dr. Annia Belt, MD    First Contact: Dr. Marlowe Sax Pager: [phone]  Second Contact: Dr. Arcelia Jew Pager: [phone]       After Hours (After 5p/  First Contact Pager: [phone]  weekends / holidays): Second Contact Pager: [phone]   Chief Complaint: Hip pain  History of Present Illness: 82 y/o woman with PMHx significant for IDDM, CAD s/p CABG 2002, Afib, essential thrombocytosis, myelofibrosis, and PAD presented to the emergency department after tripping over her wheelchair and landing on her left hip. She was currently undergoing rehabilitation at the [facility] in [place] after recent surgery on her right lower extremity ulcers 2/2 peripheral vascular disease. Prior to this episode she was feeling at her baseline health and walking with a of a walker for most short distances and in her room. She denies any dizziness, weakness, or loss of consciousness associated with her fall. She has intact sensation and strength in her legs. She is currently wearing a right leg boot with wound dressings in place over her right foot, and this is the extremity that she caught on the wheelchair.  After arrival in the ED she had received intravenous narcotics with excellent pain relief. Plain film imaging of the left hip demonstrated a comminuted intratrochanteric fracture of the left proximal femur. She was also incidentallly noted to have a sacral decubitus ulcer POA.  Of note she was falling earlier this year before undergoing surgery for an ulcer of the right leg. One fall caused a fracture of 4 ribs. She was discontinued from her Xarelto for Afib due to concern of her bleeding risk with these increasingly frequent falls. She also has recently had hematuria 2/2 large kidney  stone.  Meds: Current Facility-Administered Medications  Medication Dose Route Frequency Provider Last Rate Last Dose  . albuterol (PROVENTIL) (2.5 MG/3ML) 0.083% nebulizer solution 2.5 mg  2.5 mg Inhalation Q6H PRN Collier Salina, MD      . dextrose 5 %-0.45 % sodium chloride infusion   Intravenous Continuous Collier Salina, MD      . Derrill Memo ON 09/05/2015] diltiazem (CARDIZEM CD) 24 hr capsule 120 mg  120 mg Oral Daily Collier Salina, MD      . insulin aspart (novoLOG) injection 0-5 Units  0-5 Units Subcutaneous QHS Collier Salina, MD      . Derrill Memo ON 09/05/2015] insulin aspart (novoLOG) injection 0-9 Units  0-9 Units Subcutaneous TID WC Collier Salina, MD      . Derrill Memo ON 09/05/2015] metoprolol succinate (TOPROL-XL) 24 hr tablet 25 mg  25 mg Oral Daily Collier Salina, MD      . morphine 2 MG/ML injection 1 mg  1 mg Intravenous Q4H PRN Collier Salina, MD   1 mg at 09/04/15 1840    Allergies: Allergies as of 09/04/2015  . (No Known Allergies)   Past Medical History  Diagnosis Date  . Depression   . Essential hypertension, benign   . Hypercholesteremia   . Insulin dependent diabetes mellitus ([place])   . Essential thrombocytosis ([facility])     JAK2 negative - followed by Dr. Owens Loffler  .  Coronary atherosclerosis of native coronary artery     Multivessel status post CABG in [place]  . Atrial fibrillation ([place])   . Myelofibrosis ([place])   . Seizure [facility])    Past Surgical History  Procedure Laterality Date  . Abdominal hysterectomy    . Cataract extraction    . Coronary artery bypass graft      Possibly 2002 in [place]  . Cardioversion N/A 09/09/2014    Procedure: CARDIOVERSION;  Surgeon: Josue Hector, MD;  Location: [facility] ENDOSCOPY;  Service: Cardiovascular;  Laterality: N/A;  . Cardioversion N/A 10/03/2014    Procedure: CARDIOVERSION;  Surgeon: Arnoldo Lenis, MD;  Location: [facility];  Service: Endoscopy;  Laterality: N/A;  . Peripheral vascular catheterization N/A  08/27/2015    Procedure: Abdominal Aortogram w/Lower Extremity;  Surgeon: Conrad Toms Brook, MD;  Location: [facility];  Service: Cardiovascular;  Laterality: N/A;   Family History  Problem Relation Age of Onset  . Coronary artery disease Mother    Social History   Social History  . Marital Status: Widowed    Spouse Name: N/A  . Number of Children: N/A  . Years of Education: N/A   Occupational History  . Retired     Environmental manager   Social History Main Topics  . Smoking status: Never Smoker   . Smokeless tobacco: Never Used  . Alcohol Use: 0.0 oz/week    0 Standard drinks or equivalent per week     Comment: Socially  . Drug Use: No  . Sexual Activity: Not on file   Other Topics Concern  . Not on file   Social History Narrative   Review of Systems: Review of Systems  Eyes: Negative for blurred vision and double vision.  Respiratory: Negative for shortness of breath.   Cardiovascular: Negative for chest pain.  Gastrointestinal: Negative for abdominal pain.  Genitourinary: Negative for dysuria.  Musculoskeletal: Positive for falls.  Skin: Negative for rash.  Neurological: Negative for dizziness, weakness and headaches.  Endo/Heme/Allergies: Bruises/bleeds easily.  Psychiatric/Behavioral: Negative for substance abuse. The patient does not have insomnia.    Physical Exam: Blood pressure 115/69, pulse 97, temperature 98.1 F (36.7 C), temperature source Oral, resp. rate 19, SpO2 92 %.  GENERAL- Pleasant elderly woman, NAD HEENT- No lacerations or contusions on scalp, PERRLA, EOMI, oral mucosa appears moist CARDIAC- RRR, no murmurs, rubs or gallops. RESP- CTAB, no wheezes or crackles. ABDOMEN- Soft, nontender BACK- Exam limited by severe pain on turning NEURO- No obvious Cr N abnormality, strength upper and lower extremities- 5/5, Sensation intact globally EXTREMITIES- RLE with wound dressing in place over shin and foot, toes visualized skin is poor condition but viable  with thick nails, LLE externally rotated with severe pain on pressure or rotation of the hip joint SKIN- Warm, dry, No rash or lesion. PSYCH- Normal mood and affect, appropriate thought content and speech.  Lab results: Basic Metabolic Panel:  Recent Labs  09/04/15 1400  NA 135  K 4.5  CL 96*  CO2 31  GLUCOSE 128*  BUN 16  CREATININE 0.89  CALCIUM 8.9   CBC:  Recent Labs  09/04/15 1400  WBC 18.6*  NEUTROABS 15.8*  HGB 12.4  HCT 37.7  MCV 113.2*  PLT 795*     Imaging results:  Dg Chest Portable 1 View  09/04/2015  CLINICAL DATA:  Left hip fracture. Pre-op respiratory exam diabetes, atrial fibrillation, and coronary artery disease. EXAM: PORTABLE CHEST 1 VIEW COMPARISON:  07/23/2015 FINDINGS: Heart size is  within normal limits. Both lungs are clear. No evidence of pneumothorax or pleural effusion. Lordotic positioning noted. Previous median sternotomy noted. IMPRESSION: No active disease. Electronically Signed   By: Earle Gell M.D.   On: 09/04/2015 15:15   Dg Hip Unilat With Pelvis 2-3 Views Left  09/04/2015  CLINICAL DATA:  Fall with left hip pain EXAM: DG HIP (WITH OR WITHOUT PELVIS) 2-3V LEFT COMPARISON:  02/25/2010 CT abdomen/pelvis FINDINGS: There is a markedly comminuted intertrochanteric left proximal femur fracture with mild apex lateral angulation and no significant displacement. No additional fracture. No dislocation at the hip joints. Mild osteoarthritis in both hip joints. Degenerative changes in the visualized lower lumbar spine. IMPRESSION: Markedly comminuted intratrochanteric left proximal femur fracture. Electronically Signed   By: Ilona Sorrel M.D.   On: 09/04/2015 14:55    Assessment & Plan by Problem: Closed fracture of the left hip: Fracture caused by fall from standing height after tripping over a wheelchair with her leg that is in a boot and dressings. No evidence of significant other traumas. She cannot tolerate even turning in bed without severe pain  so agree surgical repair should be pursued very soon. -Orthopedic surgery consulted, recommendations appreciated -Plan ideally for IMN placement tomorrow -NPO at midnight -Provide maintenance IVF -Check pre-op labs  Type 2 Diabetes Mellitus: Unclear if she is on 10U BID or 15U BID. Glucose 128 on arrival, unlikely to be a major cause of her current presentation -SSI-S + ac/hs  CAD: Continue diltiazem 24hr cap 120mg , toprol XL 25mg . Blood pressure 110s-140s with HR in 90s so far this admission. Can re-add additional home antihypertensives as needed but for now just rate control agents with her Hx of Afib. -dilt 120mg  -toprol 25mg   Paroxysmal Afib: She has been in and out of Afib with RVR numerous times since 2014 with DCCV x3 times but unable to maintain sinus rhythm. NOT an ablation candidate per cardiology office note reports. She is no longer on anticoagulation due to excessive bleed risk with her numerous falls. It is very unlikely she fell due to arrhythmia as there were no associated prodromal or systemic signs.  Pressure ulcer: Sacral decub POA. Residing in nursing facility since February but unclear how old this lesion is exactly. -Wound care consulted for evaluation and management  Seizure d/o (?): Admitted in 01/2015 for focal status epilepticus. Treated during hospital course on [place]. Unclear if decision to discontinue management was intentional per chart review. Consider follow up if clinically appropriate.  Diet: Regular VTE ppx: Resume postoperatively per surgical reccommendations FULL CODE  Dispo: Disposition is deferred at this time, awaiting improvement of current medical problems. Anticipated discharge in approximately 2-4 day(s).   The patient does have a current PCP (Neale Burly, MD) and does need an [facility] hospital follow-up appointment after discharge.  The patient does have transportation limitations that hinder transportation to clinic  appointments.  Signed: Collier Salina, MD 09/04/2015, 6:57 PM

## 2015-09-05 ENCOUNTER — Inpatient Hospital Stay (HOSPITAL_COMMUNITY): Payer: Medicare Other

## 2015-09-05 ENCOUNTER — Encounter (HOSPITAL_COMMUNITY)
Admission: EM | Disposition: A | Discharge status: Skilled Nursing Facility | Payer: Self-pay | Source: Home / Self Care | Attending: Oncology

## 2015-09-05 ENCOUNTER — Inpatient Hospital Stay (HOSPITAL_COMMUNITY): Payer: Medicare Other | Admitting: Certified Registered Nurse Anesthetist

## 2015-09-05 DIAGNOSIS — E1151 Type 2 diabetes mellitus with diabetic peripheral angiopathy without gangrene: Secondary | ICD-10-CM

## 2015-09-05 DIAGNOSIS — C946 Myelodysplastic disease, not classified: Secondary | ICD-10-CM

## 2015-09-05 DIAGNOSIS — W1809XA Striking against other object with subsequent fall, initial encounter: Secondary | ICD-10-CM

## 2015-09-05 DIAGNOSIS — Z8669 Personal history of other diseases of the nervous system and sense organs: Secondary | ICD-10-CM

## 2015-09-05 DIAGNOSIS — Z9181 History of falling: Secondary | ICD-10-CM

## 2015-09-05 DIAGNOSIS — E08621 Diabetes mellitus due to underlying condition with foot ulcer: Secondary | ICD-10-CM | POA: Insufficient documentation

## 2015-09-05 DIAGNOSIS — Z794 Long term (current) use of insulin: Secondary | ICD-10-CM

## 2015-09-05 DIAGNOSIS — E11621 Type 2 diabetes mellitus with foot ulcer: Secondary | ICD-10-CM

## 2015-09-05 DIAGNOSIS — L97519 Non-pressure chronic ulcer of other part of right foot with unspecified severity: Secondary | ICD-10-CM

## 2015-09-05 DIAGNOSIS — Z79899 Other long term (current) drug therapy: Secondary | ICD-10-CM

## 2015-09-05 DIAGNOSIS — S72092A Other fracture of head and neck of left femur, initial encounter for closed fracture: Secondary | ICD-10-CM

## 2015-09-05 DIAGNOSIS — L89159 Pressure ulcer of sacral region, unspecified stage: Secondary | ICD-10-CM

## 2015-09-05 DIAGNOSIS — I739 Peripheral vascular disease, unspecified: Secondary | ICD-10-CM | POA: Insufficient documentation

## 2015-09-05 DIAGNOSIS — I482 Chronic atrial fibrillation: Secondary | ICD-10-CM

## 2015-09-05 DIAGNOSIS — I2510 Atherosclerotic heart disease of native coronary artery without angina pectoris: Secondary | ICD-10-CM

## 2015-09-05 DIAGNOSIS — S72002A Fracture of unspecified part of neck of left femur, initial encounter for closed fracture: Secondary | ICD-10-CM | POA: Insufficient documentation

## 2015-09-05 HISTORY — PX: INTRAMEDULLARY (IM) NAIL INTERTROCHANTERIC: SHX5875

## 2015-09-05 LAB — GLUCOSE, CAPILLARY
GLUCOSE-CAPILLARY: 149 mg/dL — AB (ref 65–99)
GLUCOSE-CAPILLARY: 174 mg/dL — AB (ref 65–99)
GLUCOSE-CAPILLARY: 190 mg/dL — AB (ref 65–99)
GLUCOSE-CAPILLARY: 180 mg/dL — AB (ref 65–99)
Glucose-Capillary: 173 mg/dL — ABNORMAL HIGH (ref 65–99)
Glucose-Capillary: 195 mg/dL — ABNORMAL HIGH (ref 65–99)

## 2015-09-05 LAB — SURGICAL PCR SCREEN
MRSA, PCR: NEGATIVE
Staphylococcus aureus: NEGATIVE

## 2015-09-05 SURGERY — FIXATION, FRACTURE, INTERTROCHANTERIC, WITH INTRAMEDULLARY ROD
Anesthesia: General | Site: Hip | Laterality: Left

## 2015-09-05 MED ORDER — LACTATED RINGERS IV SOLN
INTRAVENOUS | Status: DC
  Administered 2015-09-05 (×2): via INTRAVENOUS

## 2015-09-05 MED ORDER — HYDROXYUREA 500 MG PO CAPS
500.0000 mg | ORAL_CAPSULE | Freq: Every day | ORAL | Status: DC
  Administered 2015-09-06 – 2015-09-07 (×2): 500 mg via ORAL
  Filled 2015-09-05 (×2): qty 1

## 2015-09-05 MED ORDER — SODIUM CHLORIDE 0.9 % IV SOLN
INTRAVENOUS | Status: DC
  Administered 2015-09-05: 13:00:00 via INTRAVENOUS

## 2015-09-05 MED ORDER — SUCCINYLCHOLINE CHLORIDE 20 MG/ML IJ SOLN
INTRAMUSCULAR | Status: DC | PRN
  Administered 2015-09-05: 100 mg via INTRAVENOUS

## 2015-09-05 MED ORDER — ACETAMINOPHEN 650 MG RE SUPP: 650.0000 mg | Freq: Four times a day (QID) | RECTAL | Status: DC | PRN

## 2015-09-05 MED ORDER — ASPIRIN EC 325 MG PO TBEC
325.0000 mg | DELAYED_RELEASE_TABLET | Freq: Every day | ORAL | Status: DC
  Administered 2015-09-06 – 2015-09-07 (×2): 325 mg via ORAL
  Filled 2015-09-05 (×2): qty 1

## 2015-09-05 MED ORDER — PROPOFOL 10 MG/ML IV BOLUS
INTRAVENOUS | Status: DC | PRN
  Administered 2015-09-05: 100 mg via INTRAVENOUS

## 2015-09-05 MED ORDER — PHENYLEPHRINE HCL 10 MG/ML IJ SOLN
10.0000 mg | INTRAVENOUS | Status: DC | PRN
  Administered 2015-09-05: 10 ug/min via INTRAVENOUS

## 2015-09-05 MED ORDER — MORPHINE SULFATE (PF) 2 MG/ML IV SOLN: 0.5000 mg | INTRAVENOUS | Status: DC | PRN

## 2015-09-05 MED ORDER — OXYCODONE HCL 5 MG/5ML PO SOLN: 5.0000 mg | Freq: Once | ORAL | Status: DC | PRN

## 2015-09-05 MED ORDER — MENTHOL 3 MG MT LOZG: 1.0000 | LOZENGE | OROMUCOSAL | Status: DC | PRN

## 2015-09-05 MED ORDER — SUGAMMADEX SODIUM 500 MG/5ML IV SOLN
INTRAVENOUS | Status: AC
  Filled 2015-09-05: qty 5

## 2015-09-05 MED ORDER — FENTANYL CITRATE (PF) 100 MCG/2ML IJ SOLN
INTRAMUSCULAR | Status: AC
  Administered 2015-09-05: 25 ug via INTRAVENOUS
  Filled 2015-09-05: qty 2

## 2015-09-05 MED ORDER — SUGAMMADEX SODIUM 500 MG/5ML IV SOLN
INTRAVENOUS | Status: DC | PRN
  Administered 2015-09-05: 300 mg via INTRAVENOUS

## 2015-09-05 MED ORDER — ACETAMINOPHEN 325 MG PO TABS: 650.0000 mg | ORAL_TABLET | Freq: Four times a day (QID) | ORAL | Status: DC | PRN

## 2015-09-05 MED ORDER — ONDANSETRON HCL 4 MG/2ML IJ SOLN
INTRAMUSCULAR | Status: DC | PRN
  Administered 2015-09-05: 4 mg via INTRAVENOUS

## 2015-09-05 MED ORDER — CEFAZOLIN SODIUM-DEXTROSE 2-4 GM/100ML-% IV SOLN
2.0000 g | Freq: Four times a day (QID) | INTRAVENOUS | Status: AC
  Administered 2015-09-05 (×2): 2 g via INTRAVENOUS
  Filled 2015-09-05 (×2): qty 100

## 2015-09-05 MED ORDER — PHENYLEPHRINE HCL 10 MG/ML IJ SOLN
INTRAMUSCULAR | Status: DC | PRN
  Administered 2015-09-05 (×2): 80 ug via INTRAVENOUS

## 2015-09-05 MED ORDER — FENTANYL CITRATE (PF) 100 MCG/2ML IJ SOLN
INTRAMUSCULAR | Status: DC | PRN
  Administered 2015-09-05: 50 ug via INTRAVENOUS
  Administered 2015-09-05 (×2): 100 ug via INTRAVENOUS

## 2015-09-05 MED ORDER — ACETAMINOPHEN 500 MG PO TABS: 500.0000 mg | ORAL_TABLET | Freq: Four times a day (QID) | ORAL | Status: AC | PRN

## 2015-09-05 MED ORDER — FENTANYL CITRATE (PF) 100 MCG/2ML IJ SOLN
25.0000 ug | INTRAMUSCULAR | Status: DC | PRN
  Administered 2015-09-05 (×2): 25 ug via INTRAVENOUS

## 2015-09-05 MED ORDER — APIXABAN 2.5 MG PO TABS
2.5000 mg | ORAL_TABLET | Freq: Two times a day (BID) | ORAL | Status: DC
  Administered 2015-09-05 – 2015-09-07 (×4): 2.5 mg via ORAL
  Filled 2015-09-05 (×4): qty 1

## 2015-09-05 MED ORDER — OXYCODONE HCL 5 MG PO TABS: 5.0000 mg | ORAL_TABLET | Freq: Once | ORAL | Status: DC | PRN

## 2015-09-05 MED ORDER — METOCLOPRAMIDE HCL 5 MG/ML IJ SOLN: 5.0000 mg | Freq: Three times a day (TID) | INTRAMUSCULAR | Status: DC | PRN

## 2015-09-05 MED ORDER — FENTANYL CITRATE (PF) 250 MCG/5ML IJ SOLN
INTRAMUSCULAR | Status: AC
  Filled 2015-09-05: qty 5

## 2015-09-05 MED ORDER — LIDOCAINE HCL (CARDIAC) 20 MG/ML IV SOLN
INTRAVENOUS | Status: AC
  Filled 2015-09-05: qty 5

## 2015-09-05 MED ORDER — ONDANSETRON HCL 4 MG/2ML IJ SOLN: 4.0000 mg | Freq: Four times a day (QID) | INTRAMUSCULAR | Status: DC | PRN

## 2015-09-05 MED ORDER — PHENOL 1.4 % MT LIQD: 1.0000 | OROMUCOSAL | Status: DC | PRN

## 2015-09-05 MED ORDER — ROCURONIUM BROMIDE 100 MG/10ML IV SOLN
INTRAVENOUS | Status: DC | PRN
  Administered 2015-09-05: 40 mg via INTRAVENOUS

## 2015-09-05 MED ORDER — LIDOCAINE HCL (CARDIAC) 20 MG/ML IV SOLN
INTRAVENOUS | Status: DC | PRN
  Administered 2015-09-05: 60 mg via INTRAVENOUS

## 2015-09-05 MED ORDER — COLLAGENASE 250 UNIT/GM EX OINT
TOPICAL_OINTMENT | Freq: Every day | CUTANEOUS | Status: DC
  Administered 2015-09-05 – 2015-09-07 (×3): via TOPICAL
  Filled 2015-09-05: qty 30

## 2015-09-05 MED ORDER — ASPIRIN EC 325 MG PO TBEC: 325.0000 mg | DELAYED_RELEASE_TABLET | Freq: Every day | ORAL | Status: DC

## 2015-09-05 MED ORDER — METOCLOPRAMIDE HCL 5 MG PO TABS: 5.0000 mg | ORAL_TABLET | Freq: Three times a day (TID) | ORAL | Status: DC | PRN

## 2015-09-05 MED ORDER — HYDROCODONE-ACETAMINOPHEN 5-325 MG PO TABS
1.0000 | ORAL_TABLET | Freq: Four times a day (QID) | ORAL | Status: DC | PRN
  Administered 2015-09-05 – 2015-09-06 (×4): 1 via ORAL
  Administered 2015-09-06 (×2): 2 via ORAL
  Administered 2015-09-07: 1 via ORAL
  Administered 2015-09-07: 2 via ORAL
  Administered 2015-09-07: 1 via ORAL
  Filled 2015-09-05 (×3): qty 1
  Filled 2015-09-05 (×2): qty 2
  Filled 2015-09-05: qty 1
  Filled 2015-09-05: qty 2
  Filled 2015-09-05 (×2): qty 1

## 2015-09-05 MED ORDER — 0.9 % SODIUM CHLORIDE (POUR BTL) OPTIME
TOPICAL | Status: DC | PRN
  Administered 2015-09-05: 1000 mL

## 2015-09-05 MED ORDER — ONDANSETRON HCL 4 MG PO TABS: 4.0000 mg | ORAL_TABLET | Freq: Four times a day (QID) | ORAL | Status: DC | PRN

## 2015-09-05 MED ORDER — PROPOFOL 10 MG/ML IV BOLUS
INTRAVENOUS | Status: AC
  Filled 2015-09-05: qty 20

## 2015-09-05 SURGICAL SUPPLY — 31 items
BLADE SURG 15 STRL LF DISP TIS (BLADE) ×1 IMPLANT
BLADE SURG 15 STRL SS (BLADE) ×2
BLADE TFNA HELICAL 100 STRL (Anchor) ×3 IMPLANT
COVER PERINEAL POST (MISCELLANEOUS) ×3 IMPLANT
COVER SURGICAL LIGHT HANDLE (MISCELLANEOUS) ×6 IMPLANT
COVER TABLE BACK 60X90 (DRAPES) ×3 IMPLANT
DRAPE C-ARM 42X72 X-RAY (DRAPES) ×3 IMPLANT
DRAPE STERI IOBAN 125X83 (DRAPES) ×3 IMPLANT
DRSG ADAPTIC 3X8 NADH LF (GAUZE/BANDAGES/DRESSINGS) ×3 IMPLANT
DRSG MEPILEX BORDER 4X4 (GAUZE/BANDAGES/DRESSINGS) ×3 IMPLANT
DRSG MEPILEX BORDER 4X8 (GAUZE/BANDAGES/DRESSINGS) ×3 IMPLANT
ELECT REM PT RETURN 9FT ADLT (ELECTROSURGICAL) ×3
ELECTRODE REM PT RTRN 9FT ADLT (ELECTROSURGICAL) ×1 IMPLANT
EVACUATOR 1/8 PVC DRAIN (DRAIN) IMPLANT
GLOVE BIOGEL PI IND STRL 9 (GLOVE) ×1 IMPLANT
GLOVE BIOGEL PI INDICATOR 9 (GLOVE) ×2
GLOVE SURG ORTHO 9.0 STRL STRW (GLOVE) ×3 IMPLANT
GOWN STRL REUS W/ TWL XL LVL3 (GOWN DISPOSABLE) ×3 IMPLANT
GOWN STRL REUS W/TWL XL LVL3 (GOWN DISPOSABLE) ×6
KIT BASIN OR (CUSTOM PROCEDURE TRAY) ×3 IMPLANT
KIT ROOM TURNOVER OR (KITS) ×3 IMPLANT
LINER BOOT UNIVERSAL DISP (MISCELLANEOUS) ×3 IMPLANT
MANIFOLD NEPTUNE II (INSTRUMENTS) ×3 IMPLANT
NAIL TROCH FIX 10X170 130 (Nail) ×3 IMPLANT
NS IRRIG 1000ML POUR BTL (IV SOLUTION) ×3 IMPLANT
PACK GENERAL/GYN (CUSTOM PROCEDURE TRAY) ×3 IMPLANT
PAD ARMBOARD 7.5X6 YLW CONV (MISCELLANEOUS) ×6 IMPLANT
SCREW LOCKING 5.0X34MM (Screw) ×3 IMPLANT
STAPLER VISISTAT 35W (STAPLE) IMPLANT
SUT VIC AB 2-0 CTB1 (SUTURE) IMPLANT
WATER STERILE IRR 1000ML POUR (IV SOLUTION) ×6 IMPLANT

## 2015-09-05 NOTE — Anesthesia Preprocedure Evaluation (Addendum)
Anesthesia Evaluation  Patient identified by MRN, date of birth, ID band Patient awake    Reviewed: Allergy & Precautions, NPO status , Patient's Chart, lab work & pertinent test results, reviewed documented beta blocker date and time   Airway Mallampati: II  TM Distance: >3 FB Neck ROM: Full    Dental  (+) Missing, Teeth Intact, Dental Advisory Given,    Pulmonary COPD,  COPD inhaler,    breath sounds clear to auscultation       Cardiovascular hypertension, Pt. on medications and Pt. on home beta blockers + CAD, + CABG and + Peripheral Vascular Disease  + dysrhythmias Atrial Fibrillation  Rhythm:irregular Rate:Normal     Neuro/Psych Seizures -,  PSYCHIATRIC DISORDERS Depression    GI/Hepatic   Endo/Other  diabetes, Type 2  Renal/GU      Musculoskeletal   Abdominal   Peds  Hematology   Anesthesia Other Findings   Reproductive/Obstetrics                           Anesthesia Physical Anesthesia Plan  ASA: III  Anesthesia Plan: General   Post-op Pain Management:    Induction: Intravenous  Airway Management Planned: LMA  Additional Equipment:   Intra-op Plan:   Post-operative Plan:   Informed Consent: I have reviewed the patients History and Physical, chart, labs and discussed the procedure including the risks, benefits and alternatives for the proposed anesthesia with the patient or authorized representative who has indicated his/her understanding and acceptance.     Plan Discussed with: CRNA, Anesthesiologist and Surgeon  Anesthesia Plan Comments:         Anesthesia Quick Evaluation

## 2015-09-05 NOTE — Discharge Instructions (Signed)
INSTRUCTIONS AFTER JOINT REPLACEMENT  ° °o Remove items at home which could result in a fall. This includes throw rugs or furniture in walking pathways °o ICE to the affected joint every three hours while awake for 30 minutes at a time, for at least the first 3-5 days, and then as needed for pain and swelling.  Continue to use ice for pain and swelling. You may notice swelling that will progress down to the foot and ankle.  This is normal after surgery.  Elevate your leg when you are not up walking on it.   °o Continue to use the breathing machine you got in the hospital (incentive spirometer) which will help keep your temperature down.  It is common for your temperature to cycle up and down following surgery, especially at night when you are not up moving around and exerting yourself.  The breathing machine keeps your lungs expanded and your temperature down. ° ° °DIET:  As you were doing prior to hospitalization, we recommend a well-balanced diet. ° °DRESSING / WOUND CARE / SHOWERING ° °You may change your dressing 3-5 days after surgery.  Then change the dressing every day with sterile gauze.  Please use good hand washing techniques before changing the dressing.  Do not use any lotions or creams on the incision until instructed by your surgeon. ° °ACTIVITY ° °o Increase activity slowly as tolerated, but follow the weight bearing instructions below.   °o No driving for 6 weeks or until further direction given by your physician.  You cannot drive while taking narcotics.  °o No lifting or carrying greater than 10 lbs. until further directed by your surgeon. °o Avoid periods of inactivity such as sitting longer than an hour when not asleep. This helps prevent blood clots.  °o You may return to work once you are authorized by your doctor.  ° ° ° °WEIGHT BEARING  ° °Weight bearing as tolerated with assist device (walker, cane, etc) as directed, use it as long as suggested by your surgeon or therapist, typically at  least 4-6 weeks. ° ° °EXERCISES ° °Results after joint replacement surgery are often greatly improved when you follow the exercise, range of motion and muscle strengthening exercises prescribed by your doctor. Safety measures are also important to protect the joint from further injury. Any time any of these exercises cause you to have increased pain or swelling, decrease what you are doing until you are comfortable again and then slowly increase them. If you have problems or questions, call your caregiver or physical therapist for advice.  ° °Rehabilitation is important following a joint replacement. After just a few days of immobilization, the muscles of the leg can become weakened and shrink (atrophy).  These exercises are designed to build up the tone and strength of the thigh and leg muscles and to improve motion. Often times heat used for twenty to thirty minutes before working out will loosen up your tissues and help with improving the range of motion but do not use heat for the first two weeks following surgery (sometimes heat can increase post-operative swelling).  ° °These exercises can be done on a training (exercise) mat, on the floor, on a table or on a bed. Use whatever works the best and is most comfortable for you.    Use music or television while you are exercising so that the exercises are a pleasant break in your day. This will make your life better with the exercises acting as a break   in your routine that you can look forward to.   Perform all exercises about fifteen times, three times per day or as directed.  You should exercise both the operative leg and the other leg as well. ° °Exercises include: °  °• Quad Sets - Tighten up the muscle on the front of the thigh (Quad) and hold for 5-10 seconds.   °• Straight Leg Raises - With your knee straight (if you were given a brace, keep it on), lift the leg to 60 degrees, hold for 3 seconds, and slowly lower the leg.  Perform this exercise against  resistance later as your leg gets stronger.  °• Leg Slides: Lying on your back, slowly slide your foot toward your buttocks, bending your knee up off the floor (only go as far as is comfortable). Then slowly slide your foot back down until your leg is flat on the floor again.  °• Angel Wings: Lying on your back spread your legs to the side as far apart as you can without causing discomfort.  °• Hamstring Strength:  Lying on your back, push your heel against the floor with your leg straight by tightening up the muscles of your buttocks.  Repeat, but this time bend your knee to a comfortable angle, and push your heel against the floor.  You may put a pillow under the heel to make it more comfortable if necessary.  ° °A rehabilitation program following joint replacement surgery can speed recovery and prevent re-injury in the future due to weakened muscles. Contact your doctor or a physical therapist for more information on knee rehabilitation.  ° ° °CONSTIPATION ° °Constipation is defined medically as fewer than three stools per week and severe constipation as less than one stool per week.  Even if you have a regular bowel pattern at home, your normal regimen is likely to be disrupted due to multiple reasons following surgery.  Combination of anesthesia, postoperative narcotics, change in appetite and fluid intake all can affect your bowels.  ° °YOU MUST use at least one of the following options; they are listed in order of increasing strength to get the job done.  They are all available over the counter, and you may need to use some, POSSIBLY even all of these options:   ° °Drink plenty of fluids (prune juice may be helpful) and high fiber foods °Colace 100 mg by mouth twice a day  °Senokot for constipation as directed and as needed Dulcolax (bisacodyl), take with full glass of water  °Miralax (polyethylene glycol) once or twice a day as needed. ° °If you have tried all these things and are unable to have a bowel  movement in the first 3-4 days after surgery call either your surgeon or your primary doctor.   ° °If you experience loose stools or diarrhea, hold the medications until you stool forms back up.  If your symptoms do not get better within 1 week or if they get worse, check with your doctor.  If you experience "the worst abdominal pain ever" or develop nausea or vomiting, please contact the office immediately for further recommendations for treatment. ° ° °ITCHING:  If you experience itching with your medications, try taking only a single pain pill, or even half a pain pill at a time.  You can also use Benadryl over the counter for itching or also to help with sleep.  ° °TED HOSE STOCKINGS:  Use stockings on both legs until for at least 2 weeks or as   directed by physician office. They may be removed at night for sleeping.  MEDICATIONS:  See your medication summary on the After Visit Summary that nursing will review with you.  You may have some home medications which will be placed on hold until you complete the course of blood thinner medication.  It is important for you to complete the blood thinner medication as prescribed.  PRECAUTIONS:  If you experience chest pain or shortness of breath - call 911 immediately for transfer to the hospital emergency department.   If you develop a fever greater that 101 F, purulent drainage from wound, increased redness or drainage from wound, foul odor from the wound/dressing, or calf pain - CONTACT YOUR SURGEON.                                                   FOLLOW-UP APPOINTMENTS:  If you do not already have a post-op appointment, please call the office for an appointment to be seen by your surgeon.  Guidelines for how soon to be seen are listed in your After Visit Summary, but are typically between 1-4 weeks after surgery.  OTHER INSTRUCTIONS:   Knee Replacement:  Do not place pillow under knee, focus on keeping the knee straight while resting. CPM  instructions: 0-90 degrees, 2 hours in the morning, 2 hours in the afternoon, and 2 hours in the evening. Place foam block, curve side up under heel at all times except when in CPM or when walking.  DO NOT modify, tear, cut, or change the foam block in any way.  MAKE SURE YOU:   Understand these instructions.   Get help right away if you are not doing well or get worse.    Thank you for letting us be a part of your medical care team.  It is a privilege we respect greatly.  We hope these instructions will help you stay on track for a fast and full recovery!    Information on my medicine - ELIQUIS (apixaban)  This medication education was reviewed with me or my healthcare representative as part of my discharge preparation.  The pharmacist that spoke with me during my hospital stay was:  Lawson Radar, [facility]  Why was Eliquis prescribed for you? Eliquis was prescribed for you to reduce the risk of a blood clot forming that can cause a stroke if you have a medical condition called atrial fibrillation (a type of irregular heartbeat).  What do You need to know about Eliquis ? Take your Eliquis TWICE DAILY - one tablet in the morning and one tablet in the evening with or without food. If you have difficulty swallowing the tablet whole please discuss with your pharmacist how to take the medication safely.  Take Eliquis exactly as prescribed by your doctor and DO NOT stop taking Eliquis without talking to the doctor who prescribed the medication.  Stopping may increase your risk of developing a stroke.  Refill your prescription before you run out.  After discharge, you should have regular check-up appointments with your healthcare provider that is prescribing your Eliquis.  In the future your dose may need to be changed if your kidney function or weight changes by a significant amount or as you get older.  What do you do if you miss a dose? If you miss a dose, take it as  soon as you  remember on the same day and resume taking twice daily.  Do not take more than one dose of ELIQUIS at the same time to make up a missed dose.  Important Safety Information A possible side effect of Eliquis is bleeding. You should call your healthcare provider right away if you experience any of the following: ? Bleeding from an injury or your nose that does not stop. ? Unusual colored urine (red or dark brown) or unusual colored stools (red or black). ? Unusual bruising for unknown reasons. ? A serious fall or if you hit your head (even if there is no bleeding).  Some medicines may interact with Eliquis and might increase your risk of bleeding or clotting while on Eliquis. To help avoid this, consult your healthcare provider or pharmacist prior to using any new prescription or non-prescription medications, including herbals, vitamins, non-steroidal anti-inflammatory drugs (NSAIDs) and supplements.  This website has more information on Eliquis (apixaban): http://www.eliquis.com/eliquis/home

## 2015-09-05 NOTE — Consult Note (Signed)
WOC wound consult note Reason for Consult:Sacral and right LE wounds Consultation attempted today, but patient is in the OR for repair of left hip fracture.  Will attempt to see tomorrow, Sunday 09/06/15. Thanks, Maudie Flakes, MSN, RN, [place], Arther Abbott  Pager# [phone]

## 2015-09-05 NOTE — Anesthesia Postprocedure Evaluation (Signed)
Anesthesia Post Note  Patient: Theresa Bird  Procedure(s) Performed: Procedure(s) (LRB): INTRAMEDULLARY (IM) NAIL INTERTROCHANTRIC (Left)  Patient location during evaluation: PACU Anesthesia Type: General Level of consciousness: awake and alert and patient cooperative Pain management: pain level controlled Vital Signs Assessment: post-procedure vital signs reviewed and stable Respiratory status: spontaneous breathing and respiratory function stable Cardiovascular status: stable Anesthetic complications: no    Last Vitals:  Filed Vitals:   09/05/15 1130 09/05/15 1200  BP: 126/66 112/56  Pulse: 92 94  Temp: 36.8 C 36.8 C  Resp: 20 18    Last Pain:  Filed Vitals:   09/05/15 1218  PainSc: [place]

## 2015-09-05 NOTE — H&P (View-Only) (Signed)
ORTHOPAEDIC CONSULTATION  REQUESTING PHYSICIAN: Annia Belt, MD  Chief Complaint: Left hip pain  HPI: Theresa Bird is a 82 y.o. female who presents with a left comminuted intertrochanteric hip fracture. Patient states that she fell while at the [facility] sustaining the left intertrochanteric hip fracture when she hit the ground.  Past Medical History  Diagnosis Date  . Depression   . Essential hypertension, benign   . Hypercholesteremia   . Insulin dependent diabetes mellitus ([place])   . Essential thrombocytosis (HCC)     JAK2 negative - followed by Dr. Owens Loffler  . Coronary atherosclerosis of native coronary artery     Multivessel status post CABG in [place]  . Atrial fibrillation ([place])   . Myelofibrosis [facility])    Past Surgical History  Procedure Laterality Date  . Abdominal hysterectomy    . Cataract extraction    . Coronary artery bypass graft      Possibly 2002 in [place]  . Cardioversion N/A 09/09/2014    Procedure: CARDIOVERSION;  Surgeon: Josue Hector, MD;  Location: [facility] ENDOSCOPY;  Service: Cardiovascular;  Laterality: N/A;  . Cardioversion N/A 10/03/2014    Procedure: CARDIOVERSION;  Surgeon: Arnoldo Lenis, MD;  Location: [facility] ORS;  Service: Endoscopy;  Laterality: N/A;  . Peripheral vascular catheterization N/A 08/27/2015    Procedure: Abdominal Aortogram w/Lower Extremity;  Surgeon: Conrad Oketo, MD;  Location: [place] CV LAB;  Service: Cardiovascular;  Laterality: N/A;   Social History   Social History  . Marital Status: Widowed    Spouse Name: N/A  . Number of Children: N/A  . Years of Education: N/A   Occupational History  . Retired     Environmental manager   Social History Main Topics  . Smoking status: Never Smoker   . Smokeless tobacco: Never Used  . Alcohol Use: 0.0 oz/week    0 Standard drinks or equivalent per week     Comment: Socially  . Drug Use: No  . Sexual Activity: Not Asked   Other Topics Concern  . None   Social History  Narrative   Family History  Problem Relation Age of Onset  . Coronary artery disease     - negative except otherwise stated in the family history section No Known Allergies Prior to Admission medications   Medication Sig Start Date End Date Taking? Authorizing Provider  albuterol (PROAIR HFA) 108 (90 BASE) MCG/ACT inhaler Inhale 2 puffs into the lungs every 6 (six) hours as needed for wheezing or shortness of breath.   Yes Historical Provider, MD  aspirin EC 81 MG tablet Take 81 mg by mouth daily.   Yes Historical Provider, MD  atorvastatin (LIPITOR) 40 MG tablet Take 1 tablet (40 mg total) by mouth daily at 6 PM. Patient taking differently: Take 40 mg by mouth daily.  01/30/15  Yes Virginia Crews, MD  cephALEXin (KEFLEX) 500 MG capsule Take 1 capsule (500 mg total) by mouth 3 (three) times daily. 08/27/15  Yes Samantha J Rhyne, PA-C  diltiazem (CARTIA XT) 120 MG 24 hr capsule Take 120 mg by mouth daily.   Yes Historical Provider, MD  fentaNYL (DURAGESIC - DOSED MCG/HR) 25 MCG/HR patch Place 25 mcg onto the skin every 3 (three) days.   Yes Historical Provider, MD  furosemide (LASIX) 20 MG tablet Take 20 mg by mouth.   Yes Historical Provider, MD  hydroxyurea (HYDREA) 500 MG capsule Take 500 mg by mouth daily.  04/14/14  Yes Historical Provider,  MD  insulin aspart protamine - aspart (NOVOLOG MIX 70/30 FLEXPEN) (70-30) 100 UNIT/ML FlexPen Inject 15 Units into the skin 2 (two) times daily.   Yes Historical Provider, MD  insulin NPH-regular Human (NOVOLIN 70/30) (70-30) 100 UNIT/ML injection Inject 10 Units into the skin 2 (two) times daily with a meal.   Yes Historical Provider, MD  Melatonin-Pyridoxine (MELATIN PO) Take 3 mg by mouth at bedtime.   Yes Historical Provider, MD  metoprolol succinate (TOPROL-XL) 25 MG 24 hr tablet Take 25 mg by mouth daily.   Yes Historical Provider, MD  oxyCODONE-acetaminophen (PERCOCET/ROXICET) 5-325 MG tablet Take 1 tablet by mouth every 4 (four) hours as  needed for severe pain. 08/27/15  Yes Samantha J Rhyne, PA-C  rivaroxaban (XARELTO) 20 MG TABS tablet Take 20 mg by mouth daily with supper.   Yes Historical Provider, MD  sertraline (ZOLOFT) 50 MG tablet Take 50 mg by mouth daily.   Yes Historical Provider, MD  traMADol (ULTRAM) 50 MG tablet Take 50 mg by mouth every 6 (six) hours as needed for moderate pain.   Yes Historical Provider, MD  vitamin C (ASCORBIC ACID) 500 MG tablet Take 500 mg by mouth 2 (two) times daily.   Yes Historical Provider, MD  acetaminophen (TYLENOL) 325 MG tablet Take 650 mg by mouth every 4 (four) hours as needed for mild pain.    Historical Provider, MD  OXYGEN Inhale 2 L into the lungs.    Historical Provider, MD   Dg Chest Portable 1 View  09/04/2015  CLINICAL DATA:  Left hip fracture. Pre-op respiratory exam diabetes, atrial fibrillation, and coronary artery disease. EXAM: PORTABLE CHEST 1 VIEW COMPARISON:  07/23/2015 FINDINGS: Heart size is within normal limits. Both lungs are clear. No evidence of pneumothorax or pleural effusion. Lordotic positioning noted. Previous median sternotomy noted. IMPRESSION: No active disease. Electronically Signed   By: Earle Gell M.D.   On: 09/04/2015 15:15   Dg Hip Unilat With Pelvis 2-3 Views Left  09/04/2015  CLINICAL DATA:  Fall with left hip pain EXAM: DG HIP (WITH OR WITHOUT PELVIS) 2-3V LEFT COMPARISON:  02/25/2010 CT abdomen/pelvis FINDINGS: There is a markedly comminuted intertrochanteric left proximal femur fracture with mild apex lateral angulation and no significant displacement. No additional fracture. No dislocation at the hip joints. Mild osteoarthritis in both hip joints. Degenerative changes in the visualized lower lumbar spine. IMPRESSION: Markedly comminuted intratrochanteric left proximal femur fracture. Electronically Signed   By: Ilona Sorrel M.D.   On: 09/04/2015 14:55   - pertinent xrays, CT, MRI studies were reviewed and independently interpreted  Positive ROS:  All other systems have been reviewed and were otherwise negative with the exception of those mentioned in the HPI and as above.  Physical Exam: General: Alert, no acute distress Cardiovascular: No pedal edema Respiratory: No cyanosis, no use of accessory musculature GI: No organomegaly, abdomen is soft and non-tender Skin: No lesions in the area of chief complaint Neurologic: Sensation intact distally Psychiatric: Patient is competent for consent with normal mood and affect Lymphatic: No axillary or cervical lymphadenopathy  MUSCULOSKELETAL:  On examination patient's left lower extremity is shortened and externally rotated. Internal and external rotation is painful. Radiographs shows a comminuted intertrochanteric left hip fracture.  Assessment: Assessment: Comminuted intertrochanteric left hip fracture.  Plan: Plan: We will plan for intramedullary nail fixation of the left hip fracture. Nothing by mouth after midnight. Risk and benefits of surgery were discussed including infection neurovascular injury failure fixation need for additional  surgery. Patient states she understands wishes to proceed at this time she states that her daughter will be with her tonight in the hospital.  Thank you for the consult and the opportunity to see Theresa Bird, [place] [phone] 6:01 PM

## 2015-09-05 NOTE — Interval H&P Note (Signed)
History and Physical Interval Note:  09/05/2015 7:06 AM  Theresa Bird  has presented today for surgery, with the diagnosis of fx  The various methods of treatment have been discussed with the patient and family. After consideration of risks, benefits and other options for treatment, the patient has consented to  Procedure(s): INTRAMEDULLARY (IM) NAIL INTERTROCHANTRIC (Left) as a surgical intervention .  The patient's history has been reviewed, patient examined, no change in status, stable for surgery.  I have reviewed the patient's chart and labs.  Questions were answered to the patient's satisfaction.     Shakeela Rabadan V

## 2015-09-05 NOTE — Op Note (Signed)
09/04/2015 - 09/05/2015  10:34 AM  PATIENT:  Theresa Bird    PRE-OPERATIVE DIAGNOSIS: Left intertrochanteric hip fracture POST-OPERATIVE DIAGNOSIS:  Same  PROCEDURE:  INTRAMEDULLARY (IM) NAIL INTERTROCHANTRIC  SURGEON:  Berley Gambrell V, MD  PHYSICIAN ASSISTANT:None ANESTHESIA:   General  PREOPERATIVE INDICATIONS:  [name] is a  82 y.o. female with a diagnosis of fx who failed conservative measures and elected for surgical management.    The risks benefits and alternatives were discussed with the patient preoperatively including but not limited to the risks of infection, bleeding, nerve injury, cardiopulmonary complications, the need for revision surgery, among others, and the patient was willing to proceed.  OPERATIVE IMPLANTS: Synthes trochanteric nail 135 170 mm length, 100 mm barrel, 34 mm screw.  OPERATIVE FINDINGS: Stable postoperative alignment  OPERATIVE PROCEDURE: Patient brought the operating room and underwent a general anesthetic. After adequate levels anesthesia obtained patient's placed in the [facility] fracture table the left lower extremity was placed in boot traction this was reduced under fluoroscopic guidance and the right lower extremity was protected with foam and secured to the table. C-arm floss be verified alignment both AP and lateral planes. The left lower extremity was prepped using DuraPrep draped into a sterile field with the shower curtain. A timeout was called. Incision was made just proximal to the greater trochanter. The guidewire was inserted down the femoral shaft C-arm floss be verified alignment both AP and lateral planes. This was then over reamed the nail was inserted the guidewire was inserted center center into the femoral head this measured 100 mm an 100 mm barrel was inserted and locked. Distal guide was used for the distal interlocking screw of 34 mm. C-arm floss be verified alignment in both AP and lateral planes. Wounds were irrigated with  normal saline subcutaneous is closed using 2-0 Vicryl skin was closed using staples. Mepilex dressing was applied patient was extubated taken to the PACU in stable condition.

## 2015-09-05 NOTE — Transfer of Care (Signed)
Immediate Anesthesia Transfer of Care Note  Patient: Theresa Bird  Procedure(s) Performed: Procedure(s): INTRAMEDULLARY (IM) NAIL INTERTROCHANTRIC (Left)  Patient Location: PACU  Anesthesia Type:General  Level of Consciousness: awake, alert  and oriented  Airway & Oxygen Therapy: Patient connected to face mask oxygen  Post-op Assessment: Report given to RN  Post vital signs: stable  Last Vitals:  Filed Vitals:   09/04/15 2100 09/05/15 0500  BP: 107/57 108/46  Pulse: 100 96  Temp: 37.1 C 37 C  Resp: 18 18    Complications: No apparent anesthesia complications

## 2015-09-05 NOTE — Progress Notes (Addendum)
Subjective: Patient was seen and examined at bedside. S/p repair of left intertrochanteric hip fracture this am. States she is feeling well and her pain is well controlled. No other complaints.   Objective: Vital signs in last 24 hours: Filed Vitals:   09/05/15 1115 09/05/15 1125 09/05/15 1130 09/05/15 1200  BP: 123/70 130/71 126/66 112/56  Pulse: 94 93 92 94  Temp:   98.2 F (36.8 C) 98.2 F (36.8 C)  TempSrc:      Resp: 19 19 20 18   SpO2: 100% 100% 100% 96%   Weight change:   Intake/Output Summary (Last 24 hours) at 09/05/15 1205 Last data filed at 09/05/15 1130  Gross per 24 hour  Intake    100 ml  Output   1150 ml  Net  -1050 ml   Physical Exam: GENERAL- Pleasant elderly woman, NAD HEENT- PERRLA, EOMI, oral mucosa appears moist CARDIAC- RRR, no murmurs, rubs or gallops. RESP- CTAB, no wheezes or crackles. ABDOMEN- Soft, non-tender, non-distended, +BS EXTREMITIES- DP 2+ on bilateral lower extremities. Four ulcers on the lateral aspect of the right foot and 1 ulcer on the medial aspect of the foot. No increased erythema or purulent drainage from the area.  PSYCH- Normal mood and affect  Lab Results: Basic Metabolic Panel:  Recent Labs Lab 09/04/15 1400  NA 135  K 4.5  CL 96*  CO2 31  GLUCOSE 128*  BUN 16  CREATININE 0.89  CALCIUM 8.9   CBC:  Recent Labs Lab 09/04/15 1400  WBC 18.6*  NEUTROABS 15.8*  HGB 12.4  HCT 37.7  MCV 113.2*  PLT 795*   CBG:  Recent Labs Lab 09/04/15 2207 09/05/15 0626 09/05/15 0827 09/05/15 1057  GLUCAP 212* 173* 149* 174*   Coagulation:  Recent Labs Lab 09/04/15 1826  LABPROT 17.3*  INR 1.41   Urine Drug Screen: Drugs of Abuse     Component Value Date/Time   LABOPIA NONE DETECTED 01/26/2015 1630   COCAINSCRNUR NONE DETECTED 01/26/2015 1630   LABBENZ NONE DETECTED 01/26/2015 1630   AMPHETMU NONE DETECTED 01/26/2015 1630   THCU NONE DETECTED 01/26/2015 1630   LABBARB NONE DETECTED 01/26/2015 1630      Micro Results: Recent Results (from the past 240 hour(s))  Surgical pcr screen     Status: None   Collection Time: 09/04/15 10:27 PM  Result Value Ref Range Status   MRSA, PCR NEGATIVE NEGATIVE Final   Staphylococcus aureus NEGATIVE NEGATIVE Final    Comment:        The Xpert SA Assay (FDA approved for NASAL specimens in patients over 80 years of age), is one component of a comprehensive surveillance program.  Test performance has been validated by [facility] for patients greater than or equal to 25 year old. It is not intended to diagnose infection nor to guide or monitor treatment.    Studies/Results: Dg Chest Portable 1 View  09/04/2015  CLINICAL DATA:  Left hip fracture. Pre-op respiratory exam diabetes, atrial fibrillation, and coronary artery disease. EXAM: PORTABLE CHEST 1 VIEW COMPARISON:  07/23/2015 FINDINGS: Heart size is within normal limits. Both lungs are clear. No evidence of pneumothorax or pleural effusion. Lordotic positioning noted. Previous median sternotomy noted. IMPRESSION: No active disease. Electronically Signed   By: Earle Gell M.D.   On: 09/04/2015 15:15   Dg Hip Operative Unilat W Or W/o Pelvis Left  09/05/2015  CLINICAL DATA:  Left hip fracture. EXAM: OPERATIVE LEFT HIP (WITH PELVIS IF PERFORMED) 2 VIEWS TECHNIQUE: Fluoroscopic spot  image(s) were submitted for interpretation post-operatively. COMPARISON:  None. FINDINGS: Nail and intramedullary rod fixation of intertrochanteric hip fracture is seen which is in near anatomic alignment. IMPRESSION: Internal fixation of intertrochanteric left hip fracture in near anatomic alignment. Electronically Signed   By: Earle Gell M.D.   On: 09/05/2015 11:56   Dg Hip Unilat With Pelvis 2-3 Views Left  09/04/2015  CLINICAL DATA:  Fall with left hip pain EXAM: DG HIP (WITH OR WITHOUT PELVIS) 2-3V LEFT COMPARISON:  02/25/2010 CT abdomen/pelvis FINDINGS: There is a markedly comminuted intertrochanteric left proximal femur  fracture with mild apex lateral angulation and no significant displacement. No additional fracture. No dislocation at the hip joints. Mild osteoarthritis in both hip joints. Degenerative changes in the visualized lower lumbar spine. IMPRESSION: Markedly comminuted intratrochanteric left proximal femur fracture. Electronically Signed   By: Ilona Sorrel M.D.   On: 09/04/2015 14:55   Medications: I have reviewed the patient's current medications. Scheduled Meds: . [START ON 09/06/2015] aspirin EC  325 mg Oral Q breakfast  .  ceFAZolin (ANCEF) IV  2 g Intravenous Q6H  . diltiazem  120 mg Oral Daily  . insulin aspart  0-5 Units Subcutaneous QHS  . insulin aspart  0-9 Units Subcutaneous TID WC  . metoprolol succinate  25 mg Oral Daily   Continuous Infusions: . sodium chloride    . lactated ringers 10 mL/hr at 09/05/15 0825   PRN Meds:.acetaminophen **OR** acetaminophen, albuterol, HYDROcodone-acetaminophen, menthol-cetylpyridinium **OR** phenol, metoCLOPramide **OR** metoCLOPramide (REGLAN) injection, morphine injection, morphine injection, ondansetron **OR** ondansetron (ZOFRAN) IV Assessment/Plan: Principal Problem:   Closed fracture of left hip (HCC) Active Problems:   DM2 (diabetes mellitus, type 2) (HCC)   Dyslipidemia   CORONARY ATHEROSCLEROSIS NATIVE CORONARY ARTERY   Paroxysmal atrial fibrillation (HCC)   Essential hypertension   Pressure ulcer  Closed fracture of the left hip: S/p intramedullary nail intertrochanteric fixation this am. Her pain is well controlled and patient appears comfortable.  -Orthopedic surgery consulted, recommendations appreciated -Continue IV Cefazolin -Morphine 0.5 mg q2 prn pain  -Norco 5-325 mg 1-2 tabs q6 prn pain -Patient will need to be started on anticoagulation tomorrow  Type 2 Diabetes Mellitus: Unclear if she is on 10U BID or 15U BID. Glucose 174 this morning.  -SSI-S + ac/hs  CAD: Continue diltiazem 24hr cap 120mg , toprol XL 25mg . Blood  pressure 110s-140s with HR in 90s so far this admission. Can re-add additional home antihypertensives as needed but for now just rate control agents with her Hx of Afib. -dilt 120mg  -toprol 25mg  -Patient will need to be started on anticoagulation   Paroxysmal Afib: She has been in and out of Afib with RVR numerous times since 2014 with DCCV x3 times but unable to maintain sinus rhythm. NOT an ablation candidate per cardiology office note reports.  -Consider re-starting patient on anticoagulation  Essential thrombocythemia  WBCs 18.6 on admission and are chronically elevated as per chart review. Platelets 795,000 and are also chronically elevated. JAK-2 negative.  -She is followed by Dr. Owens Loffler (heme-onc)  Pressure ulcer: Sacral decub POA. Residing in nursing facility since February but unclear how old this lesion is exactly. -Wound care consulted for evaluation and management  Peripheral vascular disease Ulcers noted on the right foot. They do not appear infected - no increased erythema or purulent drainage. -Wound care consulted, appreciate recs   Seizure d/o (?): Admitted in 01/2015 for focal status epilepticus. Treated during hospital course on [place]. Unclear if decision to discontinue management was  intentional per chart review.  -Patient will need to follow-up with neurology as outpatient.   Diet: carb modified  VTE ppx: Resume postoperatively per surgical reccommendations FULL CODE  Dispo: Disposition is deferred at this time, awaiting improvement of current medical problems.  Anticipated discharge in approximately 2-3 day(s).   The patient does have a current PCP (Neale Burly, MD) and does need an [facility] hospital follow-up appointment after discharge.  The patient does not have transportation limitations that hinder transportation to clinic appointments.  .Services Needed at time of discharge: Y = Yes, Blank = No PT:   OT:   RN:   Equipment:   Other:     LOS: 1 day    Shela Leff, MD 09/05/2015, 12:05 PM

## 2015-09-05 NOTE — Consult Note (Signed)
WOC wound consult note Reason for Consult: Sacral and right foot wounds. Daughter in room at time of my assessment and I discussed findings and POC with patient and daughter. Wound type: pressure and vascular (arterial insufficiency) Pressure Ulcer POA: Yes Measurement: Seen previously by my partner D. Barbie Haggis who implemented an effective POC as all areas are improved.  Sacral Unstageable Pressure injury as been enzymatically debrided using collagenase (Santyl) and is not a Stage 3 Pressure injury.  The patients right lateral foot wounds are managed by the outpatient [place] conservatively with betadine as patient has blood flow pathology. Sacrum:  2cm x 1cm x 0.4cm with 75% pink, 5% yellow slough.  Minimal serous exudate. Left medial foot with dry callous; 0.2 x 0.2, raised above skin level; no open wound, fluctuance or drainage. No topical treatment required at this time. Right lateral heel with full thickness wound; 2.5cm  X 1cm X  0.1cm, dry yellow wound bed, no odor or drainage.  Right lateral foot with other wounds with same appearance; lateral malleolus: 0.1cm X 0.1cm X 0.1cm, midfoot: 0.8cm X 0.8cm X 0.1cm, anterior foot 0.2 X 0.2cm X 0.1cm Wound bed:As described above  Drainage (amount, consistency, odor) None from right foot. AS noted above from sacrum (small amount of serous). Periwound:Intact, dry Dressing procedure/placement/frequency:Continue present plan of care with betadine and kerlex to protect from further injury. Santyl for enzymatic debridement of nonviable tissue to sacrum. Pt can resume follow up at the outpatient wound care center after discharge.  [facility] nursing team will not follow, but will remain available to this patient, the nursing and medical teams.  Please re-consult if needed. Thanks, Maudie Flakes, MSN, RN, [place], Arther Abbott  Pager# [phone]

## 2015-09-05 NOTE — Anesthesia Procedure Notes (Signed)
Procedure Name: Intubation Date/Time: 09/05/2015 9:53 AM Performed by: Lavell Luster Pre-anesthesia Checklist: Patient identified, Emergency Drugs available, Suction available, Patient being monitored and Timeout performed Patient Re-evaluated:Patient Re-evaluated prior to inductionOxygen Delivery Method: Circle system utilized Preoxygenation: Pre-oxygenation with 100% oxygen Intubation Type: IV induction Ventilation: Mask ventilation without difficulty Laryngoscope Size: Mac and 3 Grade View: Grade I Tube type: Oral Tube size: 7.0 mm Number of attempts: 1 Airway Equipment and Method: Stylet Placement Confirmation: ETT inserted through vocal cords under direct vision,  positive ETCO2 and breath sounds checked- equal and bilateral Secured at: 21 cm Tube secured with: Tape Dental Injury: Teeth and Oropharynx as per pre-operative assessment

## 2015-09-06 LAB — CBC
HCT: 28.8 % — ABNORMAL LOW (ref 36.0–46.0)
Hemoglobin: 9.6 g/dL — ABNORMAL LOW (ref 12.0–15.0)
MCH: 37.8 pg — ABNORMAL HIGH (ref 26.0–34.0)
MCHC: 33.3 g/dL (ref 30.0–36.0)
MCV: 113.4 fL — ABNORMAL HIGH (ref 78.0–100.0)
PLATELETS: 719 10*3/uL — AB (ref 150–400)
RBC: 2.54 MIL/uL — ABNORMAL LOW (ref 3.87–5.11)
RDW: 19.9 % — AB (ref 11.5–15.5)
WBC: 21.7 10*3/uL — AB (ref 4.0–10.5)

## 2015-09-06 LAB — BASIC METABOLIC PANEL
ANION GAP: 10 (ref 5–15)
BUN: 7 mg/dL (ref 6–20)
CALCIUM: 8.1 mg/dL — AB (ref 8.9–10.3)
CO2: 29 mmol/L (ref 22–32)
CREATININE: 0.53 mg/dL (ref 0.44–1.00)
Chloride: 91 mmol/L — ABNORMAL LOW (ref 101–111)
Glucose, Bld: 161 mg/dL — ABNORMAL HIGH (ref 65–99)
Potassium: 4 mmol/L (ref 3.5–5.1)
Sodium: 130 mmol/L — ABNORMAL LOW (ref 135–145)

## 2015-09-06 LAB — GLUCOSE, CAPILLARY
GLUCOSE-CAPILLARY: 197 mg/dL — AB (ref 65–99)
GLUCOSE-CAPILLARY: 221 mg/dL — AB (ref 65–99)
GLUCOSE-CAPILLARY: 170 mg/dL — AB (ref 65–99)
Glucose-Capillary: 163 mg/dL — ABNORMAL HIGH (ref 65–99)

## 2015-09-06 MED ORDER — SERTRALINE HCL 50 MG PO TABS
50.0000 mg | ORAL_TABLET | Freq: Every day | ORAL | Status: DC
  Administered 2015-09-06 – 2015-09-07 (×2): 50 mg via ORAL
  Filled 2015-09-06 (×2): qty 1

## 2015-09-06 MED ORDER — ATORVASTATIN CALCIUM 40 MG PO TABS
40.0000 mg | ORAL_TABLET | Freq: Every day | ORAL | Status: DC
  Administered 2015-09-06 – 2015-09-07 (×2): 40 mg via ORAL
  Filled 2015-09-06 (×2): qty 1

## 2015-09-06 NOTE — Progress Notes (Signed)
Patient ID: Theresa Bird, female   DOB: 1933/12/03, 82 y.o.   MRN: [id number] Clinical status and management plan discussed with resident physician Dr. Albin Felling and I concur with her evaluation and plan. She is clinically stable now postop day 1 intramedullary nail placement to stabilize intertrochanteric fracture of the left femur. We started her on age-adjusted anticoagulation with apixiban and last evening. Expected fall of hemoglobin with surgery down from preop 12.4 to today's value of 9.6. We will continue to monitor. With regard to her chronic myeloproliferative disorder on low-dose Hydrea, we will need to see who her hematologist is at time of discharge. Dr. Cheri Rous no longer working at the [employer].

## 2015-09-06 NOTE — Progress Notes (Signed)
Patient ID: Theresa Bird, female   DOB: 1933-12-15, 82 y.o.   MRN: [id number] Postoperative day 1 intramedullary nail fixation for left intertrochanteric hip fracture. Patient is resting comfortably this morning. Plan for discharge to skilled nursing. Patient safe for discharge from orthopedic standpoint at this time.

## 2015-09-06 NOTE — Progress Notes (Signed)
OT Cancellation Note  Patient Details Name: Theresa Bird MRN: [id number] DOB: 14-Dec-1933   Cancelled Treatment:    Reason Eval/Treat Not Completed: OT screened, no needs identified, will sign off. Pt planning to discharge to SNF for post-acute rehab. Defer OT evaluation to next venue of care.  Redmond Baseman, OTR/L Pager: [phone] 09/06/2015, 2:26 PM

## 2015-09-06 NOTE — Evaluation (Signed)
Physical Therapy Evaluation Patient Details Name: Theresa Bird MRN: [id number] DOB: [birth year] Today's Date: 09/06/2015   History of Present Illness  Pleasant 82 year old woman with known coronary artery disease, status post bypass surgery 2002, chronic atrial fibrillation previously on chronic anticoagulation, type 2 insulin-dependent diabetes, and a myeloproliferative disorder (essential thrombocytosis/myelofibrosis; She was in Rehab at [facility], recovering from a recent surgical procedue on R foot lateral foot wounds (she wears a [place]) when she fell during a wheelchair transfer, resulting in a L hip fracture, now s/p ORIF, WBAT  Clinical Impression   Patient is s/p above surgery resulting in functional limitations due to the deficits listed below (see PT Problem List). Abel to stand with +2 assist, but fatigues quickly; Hopes to resume her rehab soon;  Patient will benefit from skilled PT to increase their independence and safety with mobility to allow discharge to the venue listed below.       Follow Up Recommendations SNF    Equipment Recommendations  Rolling walker with 5" wheels;3in1 (PT);Wheelchair (measurements PT);Wheelchair cushion (measurements PT)    Recommendations for Other Services       Precautions / Restrictions Precautions Precautions: Fall Restrictions RLE Weight Bearing: Weight bearing as tolerated      Mobility  Bed Mobility Overal bed mobility: Needs Assistance;+2 for physical assistance Bed Mobility: Supine to Sit     Supine to sit: +2 for physical assistance;Max assist     General bed mobility comments: Cues for technique; used bed pad to preposistion hips in prep for getting up to sit; Required +2 assistance and bed pad to elevate trunk and square off hips at EOB  Transfers Overall transfer level: Needs assistance Equipment used: Rolling walker (2 wheeled) Transfers: Sit to/from Stand Sit to Stand: +2 safety/equipment;Max assist          General transfer comment: Performed 2 bouts of sit <>stand; Needing +2 max assist to power up; once standing, noting good weight bearing response through LEs and initial good UE support though UEs on RW; fatigued relatively quickly and required third person to move bed and put [facility] under her to sit; then second sit to stand to move [facility] and bring bed back to lay down  Ambulation/Gait             General Gait Details: Unable today  Stairs            Wheelchair Mobility    Modified Rankin (Stroke Patients Only)       Balance Overall balance assessment: Needs assistance Sitting-balance support: Feet supported;Bilateral upper extremity supported Sitting balance-Leahy Scale: Fair Sitting balance - Comments: Initially with significant R antalgic lean, which improved with cues to relax and extra support with Joeline, SPT sitting on her L    Standing balance support: Bilateral upper extremity supported Standing balance-Leahy Scale: Zero (approachign Poor) Standing balance comment: Needing assist on both sides and RW to maintain standing                             Pertinent Vitals/Pain Pain Assessment: Faces Pain Score: 5  Faces Pain Scale: Hurts whole lot Pain Location: Reports pain in L hip, 2/10 at rest, 4/10 with ROM therex, however her grimace is significant; She also reports pain in R foot with any weight bearing Pain Descriptors / Indicators: Aching;Grimacing;Guarding Pain Intervention(s): Limited activity within patient's tolerance;Monitored during session;Repositioned    Home Living Family/patient expects to be discharged to:: Skilled  nursing facility                      Prior Function Level of Independence: Needs assistance   Gait / Transfers Assistance Needed: Was working on gait and transfers at [facility]  ADL's / Homemaking Assistance Needed: was working on ADLs at [facility]        Hand Dominance        Extremity/Trunk Assessment    Upper Extremity Assessment: Generalized weakness (Noted her upper body fatigues quickly when standing to RW)           Lower Extremity Assessment: RLE deficits/detail;LLE deficits/detail RLE Deficits / Details: Donned postop shoe for mobility; noted significant pain with weight bearing LLE Deficits / Details: Grossly decr AROM and strength, limited by pain postop; Significant grimace, especially with abduction and heel slides     Communication   Communication: No difficulties  Cognition Arousal/Alertness: Awake/alert Behavior During Therapy: WFL for tasks assessed/performed Overall Cognitive Status: Within Functional Limits for tasks assessed                      General Comments General comments (skin integrity, edema, etc.): Nursing requested to end session in bed in case of need for in and out cath; she did not void on [facility]    Exercises        Assessment/Plan    PT Assessment Patient needs continued PT services  PT Diagnosis Difficulty walking;Generalized weakness;Acute pain   PT Problem List Decreased strength;Decreased range of motion;Decreased activity tolerance;Decreased balance;Decreased mobility;Decreased coordination;Decreased knowledge of use of DME;Decreased safety awareness;Decreased knowledge of precautions;Pain;Decreased skin integrity  PT Treatment Interventions DME instruction;Gait training;Functional mobility training;Therapeutic activities;Therapeutic exercise;Balance training;Patient/family education   PT Goals (Current goals can be found in the Care Plan section) Acute Rehab PT Goals Patient Stated Goal: Hopes to get back to her rehab soon PT Goal Formulation: With patient Time For Goal Achievement: 09/20/15 Potential to Achieve Goals: Fair    Frequency Min 3X/week   Barriers to discharge        Co-evaluation               End of Session Equipment Utilized During Treatment: Gait belt Activity Tolerance: Patient limited by  pain;Patient limited by fatigue Patient left: in bed;with call bell/phone within reach;with family/visitor present Nurse Communication: Mobility status         Time: 1157-1247 (minus approx 7-8 minutes while on BSC) PT Time Calculation (min) (ACUTE ONLY): 50 min   Charges:   PT Evaluation $PT Eval Moderate Complexity: 1 Procedure PT Treatments $Therapeutic Activity: 23-37 mins   PT G Codes:        Quin Hoop 09/06/2015, 4:41 PM  Roney Marion, [place] Pager [phone] Office [phone]

## 2015-09-06 NOTE — Progress Notes (Signed)
Subjective: No acute events overnight. Reports she had some severe pain last night but this improved after pain medication. She states her pain is adequately controlled.   Objective: Vital signs in last 24 hours: Filed Vitals:   09/05/15 1200 09/05/15 2048 09/06/15 0040 09/06/15 0501  BP: 112/56 109/51 106/52 107/56  Pulse: 94 96 100 100  Temp: 98.2 F (36.8 C) 98.6 F (37 C) 97.8 F (36.6 C) 98.1 F (36.7 C)  TempSrc:  Oral Oral Oral  Resp: 18 16 16 16   SpO2: 96% 100% 100% 100%   Weight change:   Intake/Output Summary (Last 24 hours) at 09/06/15 1010 Last data filed at 09/06/15 0844  Gross per 24 hour  Intake    700 ml  Output    950 ml  Net   -250 ml   Physical Exam: GENERAL- Pleasant elderly woman resting in bed, NAD HEENT- PERRLA, EOMI, mucus membranes moist  CARDIAC- RRR, no m/g/r RESP- CTA bilaterally, breaths non-labored  ABDOMEN- BS+, soft, non-tender  EXTREMITIES- DP 2+ on bilateral lower extremities. Four ulcers on the lateral aspect of the right foot and 1 ulcer on the medial aspect of the foot. No increased erythema or purulent drainage from the area.  PSYCH- Normal mood and affect  Lab Results: Basic Metabolic Panel:  Recent Labs Lab 09/04/15 1400 09/06/15 0551  NA 135 130*  K 4.5 4.0  CL 96* 91*  CO2 31 29  GLUCOSE 128* 161*  BUN 16 7  CREATININE 0.89 0.53  CALCIUM 8.9 8.1*   CBC:  Recent Labs Lab 09/04/15 1400 09/06/15 0551  WBC 18.6* 21.7*  NEUTROABS 15.8*  --   HGB 12.4 9.6*  HCT 37.7 28.8*  MCV 113.2* 113.4*  PLT 795* 719*   CBG:  Recent Labs Lab 09/05/15 0827 09/05/15 1057 09/05/15 1311 09/05/15 1612 09/05/15 2052 09/06/15 0659  GLUCAP 149* 174* 190* 180* 195* 163*   Coagulation:  Recent Labs Lab 09/04/15 1826  LABPROT 17.3*  INR 1.41   Urine Drug Screen: Drugs of Abuse     Component Value Date/Time   LABOPIA NONE DETECTED 01/26/2015 1630   COCAINSCRNUR NONE DETECTED 01/26/2015 1630   LABBENZ NONE  DETECTED 01/26/2015 1630   AMPHETMU NONE DETECTED 01/26/2015 1630   THCU NONE DETECTED 01/26/2015 1630   LABBARB NONE DETECTED 01/26/2015 1630    Micro Results: Recent Results (from the past 240 hour(s))  Surgical pcr screen     Status: None   Collection Time: 09/04/15 10:27 PM  Result Value Ref Range Status   MRSA, PCR NEGATIVE NEGATIVE Final   Staphylococcus aureus NEGATIVE NEGATIVE Final    Comment:        The Xpert SA Assay (FDA approved for NASAL specimens in patients over 50 years of age), is one component of a comprehensive surveillance program.  Test performance has been validated by [facility] for patients greater than or equal to 3 year old. It is not intended to diagnose infection nor to guide or monitor treatment.    Studies/Results: Dg Chest Portable 1 View  09/04/2015  CLINICAL DATA:  Left hip fracture. Pre-op respiratory exam diabetes, atrial fibrillation, and coronary artery disease. EXAM: PORTABLE CHEST 1 VIEW COMPARISON:  07/23/2015 FINDINGS: Heart size is within normal limits. Both lungs are clear. No evidence of pneumothorax or pleural effusion. Lordotic positioning noted. Previous median sternotomy noted. IMPRESSION: No active disease. Electronically Signed   By: Earle Gell M.D.   On: 09/04/2015 15:15   Dg Hip Operative  Unilat W Or W/o Pelvis Left  09/05/2015  CLINICAL DATA:  Left hip fracture. EXAM: OPERATIVE LEFT HIP (WITH PELVIS IF PERFORMED) 2 VIEWS TECHNIQUE: Fluoroscopic spot image(s) were submitted for interpretation post-operatively. COMPARISON:  None. FINDINGS: Nail and intramedullary rod fixation of intertrochanteric hip fracture is seen which is in near anatomic alignment. IMPRESSION: Internal fixation of intertrochanteric left hip fracture in near anatomic alignment. Electronically Signed   By: Earle Gell M.D.   On: 09/05/2015 11:56   Dg Hip Unilat With Pelvis 2-3 Views Left  09/04/2015  CLINICAL DATA:  Fall with left hip pain EXAM: DG HIP (WITH  OR WITHOUT PELVIS) 2-3V LEFT COMPARISON:  02/25/2010 CT abdomen/pelvis FINDINGS: There is a markedly comminuted intertrochanteric left proximal femur fracture with mild apex lateral angulation and no significant displacement. No additional fracture. No dislocation at the hip joints. Mild osteoarthritis in both hip joints. Degenerative changes in the visualized lower lumbar spine. IMPRESSION: Markedly comminuted intratrochanteric left proximal femur fracture. Electronically Signed   By: Ilona Sorrel M.D.   On: 09/04/2015 14:55   Medications: I have reviewed the patient's current medications. Scheduled Meds: . apixaban  2.5 mg Oral BID  . aspirin EC  325 mg Oral Q breakfast  . collagenase   Topical Daily  . diltiazem  120 mg Oral Daily  . hydroxyurea  500 mg Oral Daily  . insulin aspart  0-5 Units Subcutaneous QHS  . insulin aspart  0-9 Units Subcutaneous TID WC  . metoprolol succinate  25 mg Oral Daily   Continuous Infusions: . sodium chloride 10 mL/hr at 09/05/15 1328  . lactated ringers 10 mL/hr at 09/05/15 0825   PRN Meds:.acetaminophen **OR** acetaminophen, albuterol, HYDROcodone-acetaminophen, menthol-cetylpyridinium **OR** phenol, metoCLOPramide **OR** metoCLOPramide (REGLAN) injection, morphine injection, ondansetron **OR** ondansetron (ZOFRAN) IV Assessment/Plan:  Closed fracture of the left hip: S/p intramedullary nail intertrochanteric fixation on 4/1. Her pain is well controlled and patient appears comfortable. Will have her work with PT and hopefully back to rehab facility by tomorrow if ok with ortho.  - Orthopedic surgery following, recommendations appreciated - Discontinue Morphine 0.5 mg q2 prn pain  - Continue Norco 5-325 mg 1-2 tabs q6 prn pain - Continue Eliquis 2.5 mg BID  Type 2 Diabetes Mellitus: Unclear if she is on 10U BID or 15U BID at home. Blood sugars adequate here in hospital. Will continue with sliding scale. She will need to be seen by her PCP as outpatient.    - SSI-S + ac/hs  CAD: Hx of CABG. Baseline EKG obtained yesterday shows AFib, no evidence of acute ischemia.  - Restart home Lipitor - Continue diltiazem 120mg  daily - Continue toprol 25mg  daily   Paroxysmal Afib: She has been in and out of Afib with RVR numerous times since 2014 with DCCV x3 times but unable to maintain sinus rhythm. NOT an ablation candidate per cardiology office note reports. Will continue her home medications and switch her AC therapy from Xarelto to Eliquis. She is high risk for post-operative thrombosis given her age, AFib, myeloproliferative disorder, and hip fracture. - Continue Eliquis 2.5 mg BID - Continue Diltiazem and Toprol-XL as above   Myeloproliferative Disorder: WBCs 18.6 on admission and are chronically elevated as per chart review. Platelets 795,000 and are also chronically elevated. JAK-2 negative.  - She is followed by Dr. Owens Loffler (heme-onc) - On AC- Eliquis   Pressure ulcer: Sacral decub POA. Residing in nursing facility since February but unclear how old this lesion is exactly. - Wound care consulted  for evaluation and management  Peripheral vascular disease: Ulcers noted on the right foot. They do not appear infected - no increased erythema or purulent drainage. - Wound care consulted, appreciate recs   Seizure d/o (?): Admitted in 01/2015 for focal status epilepticus. Treated during hospital course on [place]. Unclear if decision to discontinue management was intentional per chart review.  - Patient will need to follow-up with neurology as outpatient.   Diet: carb modified  VTE ppx: Resume postoperatively per surgical reccommendations FULL CODE Dispo: Discharge back to rehab facility likely tomorrow   The patient does have a current PCP (Neale Burly, MD) and does need an [facility] hospital follow-up appointment after discharge.  The patient does not have transportation limitations that hinder transportation to clinic appointments.  .Services  Needed at time of discharge: Y = Yes, Blank = No PT:   OT:   RN:   Equipment:   Other:     LOS: 2 days   Juliet Rude, MD 09/06/2015, 10:10 AM

## 2015-09-07 ENCOUNTER — Encounter (HOSPITAL_COMMUNITY): Payer: Self-pay | Admitting: Orthopedic Surgery

## 2015-09-07 ENCOUNTER — Inpatient Hospital Stay (HOSPITAL_COMMUNITY): Payer: Medicare Other

## 2015-09-07 DIAGNOSIS — S72092D Other fracture of head and neck of left femur, subsequent encounter for closed fracture with routine healing: Secondary | ICD-10-CM

## 2015-09-07 DIAGNOSIS — I480 Paroxysmal atrial fibrillation: Secondary | ICD-10-CM

## 2015-09-07 DIAGNOSIS — W01198D Fall on same level from slipping, tripping and stumbling with subsequent striking against other object, subsequent encounter: Secondary | ICD-10-CM

## 2015-09-07 LAB — GLUCOSE, CAPILLARY
GLUCOSE-CAPILLARY: 172 mg/dL — AB (ref 65–99)
GLUCOSE-CAPILLARY: 196 mg/dL — AB (ref 65–99)
Glucose-Capillary: 168 mg/dL — ABNORMAL HIGH (ref 65–99)

## 2015-09-07 LAB — BASIC METABOLIC PANEL
ANION GAP: 7 (ref 5–15)
BUN: 7 mg/dL (ref 6–20)
CHLORIDE: 92 mmol/L — AB (ref 101–111)
CO2: 32 mmol/L (ref 22–32)
Calcium: 8.4 mg/dL — ABNORMAL LOW (ref 8.9–10.3)
Creatinine, Ser: 0.58 mg/dL (ref 0.44–1.00)
GFR calc Af Amer: >60 mL/min (ref >60)
GFR calc non Af Amer: >60 mL/min (ref >60)
Glucose, Bld: 152 mg/dL — ABNORMAL HIGH (ref 65–99)
POTASSIUM: 4.1 mmol/L (ref 3.5–5.1)
SODIUM: 131 mmol/L — AB (ref 135–145)

## 2015-09-07 LAB — CBC
HCT: 29.1 % — ABNORMAL LOW (ref 36.0–46.0)
Hemoglobin: 9.5 g/dL — ABNORMAL LOW (ref 12.0–15.0)
MCH: 37 pg — ABNORMAL HIGH (ref 26.0–34.0)
MCHC: 32.6 g/dL (ref 30.0–36.0)
MCV: 113.2 fL — ABNORMAL HIGH (ref 78.0–100.0)
Platelets: 764 10*3/uL — ABNORMAL HIGH (ref 150–400)
RBC: 2.57 MIL/uL — ABNORMAL LOW (ref 3.87–5.11)
RDW: 19.4 % — ABNORMAL HIGH (ref 11.5–15.5)
WBC: 22.6 10*3/uL — ABNORMAL HIGH (ref 4.0–10.5)

## 2015-09-07 MED ORDER — CALCIUM 600 MG PO TABS: 600.0000 mg | ORAL_TABLET | Freq: Two times a day (BID) | ORAL | Status: AC

## 2015-09-07 MED ORDER — ALENDRONATE SODIUM 35 MG PO TABS: 35.0000 mg | ORAL_TABLET | ORAL | Status: DC

## 2015-09-07 MED ORDER — APIXABAN 2.5 MG PO TABS: 2.5000 mg | ORAL_TABLET | Freq: Two times a day (BID) | ORAL | Status: DC

## 2015-09-07 MED ORDER — VITAMIN D3 25 MCG (1000 UNIT) PO TABS: 1000.0000 [IU] | ORAL_TABLET | Freq: Every day | ORAL | Status: AC

## 2015-09-07 NOTE — Progress Notes (Addendum)
Subjective: No acute events overnight. States she experienced dyspnea last night but does have a history of COPD. Hip is pain is well controlled. No other complaints.   Objective: Vital signs in last 24 hours: Filed Vitals:   09/06/15 1228 09/06/15 1341 09/06/15 2142 09/07/15 0751  BP:  115/52 101/46 106/52  Pulse: 103 97 97 97  Temp:  97.7 F (36.5 C) 98 F (36.7 C) 98 F (36.7 C)  TempSrc:  Oral Oral Oral  Resp:  13 16 16   SpO2: 98% 99% 97% 97%   Weight change:   Intake/Output Summary (Last 24 hours) at 09/07/15 0935 Last data filed at 09/07/15 0809  Gross per 24 hour  Intake    600 ml  Output    300 ml  Net    300 ml   Physical Exam: GENERAL- Pleasant elderly woman resting in bed, NAD CARDIAC- RRR, no m/g/r RESP- CTA bilaterally, breaths non-labored.  ABDOMEN- BS+, soft, non-tender  EXTREMITIES- DP 2+ on bilateral lower extremities.  PSYCH- Normal mood and affect  Lab Results: Basic Metabolic Panel:  Recent Labs Lab 09/06/15 0551 09/07/15 0359  NA 130* 131*  K 4.0 4.1  CL 91* 92*  CO2 29 32  GLUCOSE 161* 152*  BUN 7 7  CREATININE 0.53 0.58  CALCIUM 8.1* 8.4*   CBC:  Recent Labs Lab 09/04/15 1400 09/06/15 0551 09/07/15 0359  WBC 18.6* 21.7* 22.6*  NEUTROABS 15.8*  --   --   HGB 12.4 9.6* 9.5*  HCT 37.7 28.8* 29.1*  MCV 113.2* 113.4* 113.2*  PLT 795* 719* 764*   CBG:  Recent Labs Lab 09/05/15 2052 09/06/15 0659 09/06/15 1106 09/06/15 1623 09/06/15 2144 09/07/15 0748  GLUCAP 195* 163* 197* 221* 170* 172*   Coagulation:  Recent Labs Lab 09/04/15 1826  LABPROT 17.3*  INR 1.41   Urine Drug Screen: Drugs of Abuse     Component Value Date/Time   LABOPIA NONE DETECTED 01/26/2015 1630   COCAINSCRNUR NONE DETECTED 01/26/2015 1630   LABBENZ NONE DETECTED 01/26/2015 1630   AMPHETMU NONE DETECTED 01/26/2015 1630   THCU NONE DETECTED 01/26/2015 1630   LABBARB NONE DETECTED 01/26/2015 1630    Micro Results: Recent Results (from  the past 240 hour(s))  Surgical pcr screen     Status: None   Collection Time: 09/04/15 10:27 PM  Result Value Ref Range Status   MRSA, PCR NEGATIVE NEGATIVE Final   Staphylococcus aureus NEGATIVE NEGATIVE Final    Comment:        The Xpert SA Assay (FDA approved for NASAL specimens in patients over 6 years of age), is one component of a comprehensive surveillance program.  Test performance has been validated by [facility] for patients greater than or equal to 68 year old. It is not intended to diagnose infection nor to guide or monitor treatment.    Studies/Results: Dg Hip Operative Unilat W Or W/o Pelvis Left  09/05/2015  CLINICAL DATA:  Left hip fracture. EXAM: OPERATIVE LEFT HIP (WITH PELVIS IF PERFORMED) 2 VIEWS TECHNIQUE: Fluoroscopic spot image(s) were submitted for interpretation post-operatively. COMPARISON:  None. FINDINGS: Nail and intramedullary rod fixation of intertrochanteric hip fracture is seen which is in near anatomic alignment. IMPRESSION: Internal fixation of intertrochanteric left hip fracture in near anatomic alignment. Electronically Signed   By: Earle Gell M.D.   On: 09/05/2015 11:56   Medications: I have reviewed the patient's current medications. Scheduled Meds: . apixaban  2.5 mg Oral BID  . aspirin EC  325 mg Oral Q breakfast  . atorvastatin  40 mg Oral Daily  . collagenase   Topical Daily  . diltiazem  120 mg Oral Daily  . hydroxyurea  500 mg Oral Daily  . insulin aspart  0-5 Units Subcutaneous QHS  . insulin aspart  0-9 Units Subcutaneous TID WC  . metoprolol succinate  25 mg Oral Daily  . sertraline  50 mg Oral Daily   Continuous Infusions: . sodium chloride 10 mL/hr at 09/05/15 1328  . lactated ringers 10 mL/hr at 09/05/15 0825   PRN Meds:.acetaminophen **OR** acetaminophen, albuterol, HYDROcodone-acetaminophen, menthol-cetylpyridinium **OR** phenol, metoCLOPramide **OR** metoCLOPramide (REGLAN) injection, ondansetron **OR** ondansetron  (ZOFRAN) IV Assessment/Plan:  Closed fracture of the left hip: S/p intramedullary nail intertrochanteric fixation on 4/1. Hip pain is well controled at present. Patient is stable and will likely be discharged to SNF today. - Appreciate orthopedic surgery recs - Discontinue Morphine 0.5 mg q2 prn pain  - Continue Norco 5-325 mg 1-2 tabs q6 prn pain - Continue Eliquis 2.5 mg BID  Hx of COPD PFTs from 10/2011 c/w GOLD stage III COPD. Patient did complain of dyspnea overnight. However, currently breathing comfortably on room air. She was only noted to be on Albuterol nebulizer at home and told us she has been prescribed other inhalers in the past but has not been using them. CXR this morning showing no acute abnormality.  -Continue home medication Albuterol nebulizer  -Needs close outpatient follow-up with PCP   Type 2 Diabetes Mellitus: Unclear if she is on 10U BID or 15U BID at home. Blood sugars adequate here in hospital. Will continue with sliding scale. She will need to be seen by her PCP as outpatient.  - SSI-S + ac/hs  CAD: Hx of CABG. Baseline EKG obtained yesterday shows AFib, no evidence of acute ischemia.  - Restart home Lipitor - Continue diltiazem 120mg  daily - Continue toprol 25mg  daily   Paroxysmal Afib: She has been in and out of Afib with RVR numerous times since 2014 with DCCV x3 times but unable to maintain sinus rhythm. NOT an ablation candidate per cardiology office note reports. Will continue her home medications and switch her AC therapy from Xarelto to Eliquis. She is high risk for post-operative thrombosis given her age, AFib, myeloproliferative disorder, and hip fracture. - Continue Eliquis 2.5 mg BID - Continue Diltiazem and Toprol-XL as above   Myeloproliferative Disorder: WBCs 18.6 on admission and are chronically elevated as per chart review. Platelets 795,000 and are also chronically elevated. JAK-2 negative.  - She is followed by Dr. Owens Loffler (heme-onc) - On AC-  Eliquis   Pressure ulcer: Sacral decub POA. Residing in nursing facility since February but unclear how old this lesion is exactly. - Wound care consulted for evaluation and management  Peripheral vascular disease: Ulcers on the right foot.  - Wound care consulted, appreciate recs   Seizure d/o (?): Admitted in 01/2015 for focal status epilepticus. Treated during hospital course on [place]. Unclear if decision to discontinue management was intentional per chart review.  - Patient will need to follow-up with neurology as outpatient.   Diet: carb modified  VTE ppx: Eliquis FULL CODE Dispo: Discharge back to rehab facility likely today  The patient does have a current PCP (Neale Burly, MD) and does need an [facility] hospital follow-up appointment after discharge.  The patient does not have transportation limitations that hinder transportation to clinic appointments.  .Services Needed at time of discharge: Y = Yes, Blank = No  PT:   OT:   RN:   Equipment:   Other:     LOS: 3 days   Shela Leff, MD 09/07/2015, 9:35 AM

## 2015-09-07 NOTE — Clinical Social Work Note (Signed)
Patient admitted from [facility] or [place] with plans to return today, 4/3. Patient is a long-term resident. Facility and family aware of return.   Clinical Social Worker facilitated patient discharge including contacting patient family and facility to confirm patient discharge plans.  Clinical information faxed to facility and family agreeable with plan.  CSW arranged ambulance transport via [place] to [facility] or [place].  RN to call report prior to discharge (info on shadow chart).  Clinical Social Worker will sign off for now as social work intervention is no longer needed. Please consult [place] again if new need arises.  Glendon Axe, MSW, LCSWA [phone] 09/07/2015 2:43 PM

## 2015-09-07 NOTE — Progress Notes (Signed)
Physical Therapy Treatment Patient Details Name: Theresa Bird MRN: [id number] DOB: [birth year] Today's Date: 09/07/2015    History of Present Illness Pleasant 82 year old woman with known coronary artery disease, status post bypass surgery 2002, chronic atrial fibrillation previously on chronic anticoagulation, type 2 insulin-dependent diabetes, and a myeloproliferative disorder (essential thrombocytosis/myelofibrosis; She was in Rehab at [facility], recovering from a recent surgical procedue on R foot lateral foot wounds (she wears a [place]) when she fell during a wheelchair transfer, resulting in a L hip fracture, now s/p ORIF, WBAT    PT Comments    Pt is transferring better from sit to stand, now a +2 mod assist. Her balance and postural control has improved. Movement is limited by significant pain and guarding of L hip. Pt hopes to return to the [facility] and continue therapy.   Recommend d/c to SNF to continue skilled PT to increase her safety and independence with mobility.    Follow Up Recommendations  SNF     Equipment Recommendations  Rolling walker with 5" wheels;3in1 (PT);Wheelchair (measurements PT);Wheelchair cushion (measurements PT)    Recommendations for Other Services       Precautions / Restrictions Precautions Precautions: Fall Restrictions Weight Bearing Restrictions: Yes RLE Weight Bearing: Weight bearing as tolerated    Mobility  Bed Mobility Overal bed mobility: Needs Assistance;+2 for physical assistance Bed Mobility: Supine to Sit;Sit to Supine     Supine to sit: +2 for physical assistance;Mod assist Sit to supine: Mod assist;+2 for physical assistance   General bed mobility comments: Cues for technique; used bed pad to preposistion hips in prep for getting up to sit; Required +2 assistance and bed pad to elevate trunk and square off hips at EOB  Transfers Overall transfer level: Needs assistance Equipment used: Rolling walker (2  wheeled) Transfers: Sit to/from Stand Sit to Stand: +2 safety/equipment;Mod assist         General transfer comment: Perfromed 2 bouts of sit to/from stand. Need +2 mod assist to power up. Once standing, good balance control and upright posture. Was able to weight shift for very small side step.  Ambulation/Gait             General Gait Details: Unable today   Stairs            Wheelchair Mobility    Modified Rankin (Stroke Patients Only)       Balance Overall balance assessment: Needs assistance Sitting-balance support: Feet supported;Bilateral upper extremity supported Sitting balance-Leahy Scale: Fair Sitting balance - Comments: once EOB, sat with little support    Standing balance support: Bilateral upper extremity supported Standing balance-Leahy Scale: Poor Standing balance comment: Needed +2 for light support but was able to stand without hands on RW for support for a few seconds.                     Cognition Arousal/Alertness: Awake/alert Behavior During Therapy: WFL for tasks assessed/performed Overall Cognitive Status: Within Functional Limits for tasks assessed                      Exercises      General Comments General comments (skin integrity, edema, etc.): Same skin conditions. Pt request to void in bed.       Pertinent Vitals/Pain Pain Assessment: 0-10 Pain Score: 6  Faces Pain Scale: Hurts whole lot Pain Location: L hip, significant grimace with movement to EOB Pain Descriptors / Indicators: Aching;Grimacing;Guarding Pain Intervention(s): Limited  activity within patient's tolerance;Repositioned;Monitored during session;Premedicated before session    Home Living                      Prior Function            PT Goals (current goals can now be found in the care plan section) Acute Rehab PT Goals Patient Stated Goal: Hope to return to SNF today to continue rehab  PT Goal Formulation: With patient Time  For Goal Achievement: 09/20/15 Potential to Achieve Goals: Fair Progress towards PT goals: Progressing toward goals    Frequency  Min 3X/week    PT Plan Current plan remains appropriate    Co-evaluation             End of Session Equipment Utilized During Treatment: Gait belt Activity Tolerance: Patient limited by pain;Patient limited by fatigue Patient left: in bed;with call bell/phone within reach;with family/visitor present     Time: 1439-1455 PT Time Calculation (min) (ACUTE ONLY): 16 min  Charges:  $Therapeutic Activity: 8-22 mins                    G Codes:         Jabier Mutton, SPT Acute Rehab Services [phone]    Jabier Mutton 09/07/2015, 3:11 PM

## 2015-09-07 NOTE — Progress Notes (Signed)
Report called to [facility]. 

## 2015-09-07 NOTE — NC FL2 (Signed)
[place] LEVEL OF CARE SCREENING TOOL     IDENTIFICATION  Patient Name: Theresa Bird Birthdate: 31-Oct-1933 Sex: female Admission Date (Current Location): 09/04/2015  [facility] and [place] Number:  [employer] and Address:  [facility], [place] [address] [place], [place] [address]      Provider Number: [id number]  Attending Physician Name and Address:  Bartholomew Crews, MD  Relative Name and Phone Number:       Current Level of Care: Hospital Recommended Level of Care: [place] Prior Approval Number:    Date Approved/Denied:   PASRR Number: [id number]  Discharge Plan: SNF    Current Diagnoses: Patient Active Problem List   Diagnosis Date Noted  . Hip fracture, left ([place])   . PVD (peripheral vascular disease) ([place])   . Diabetic ulcer of right foot associated with diabetes mellitus due to underlying condition ([place])   . Closed fracture of left hip ([place]) 09/04/2015  . Pressure ulcer 08/27/2015  . PAD (peripheral artery disease) ([place]) 08/26/2015  . Seizure ([place]) 01/26/2015  . Atrial fibrillation with rapid ventricular response ([place]) 01/26/2015  . Phlebitis   . Screen for STD (sexually transmitted disease)   . Essential thrombocythemia ([place])   . Myelofibrosis ([place])   . UTI (urinary tract infection) 09/08/2014  . Atrial fibrillation with RVR ([place]) 09/07/2014  . Cellulitis of left upper arm 09/07/2014  . Paroxysmal atrial fibrillation ([place]) 04/08/2013  . Essential hypertension 04/08/2013  . CAFL (chronic airflow limitation) ([place]) 05/11/2012  . 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 Clinical depression 07/13/2011  . Diabetes ([place]) 07/13/2011  . DM2 (diabetes mellitus, type 2) ([place]) 06/19/2009  . Dyslipidemia 06/19/2009  . CORONARY ATHEROSCLEROSIS NATIVE CORONARY ARTERY 06/19/2009    Orientation RESPIRATION BLADDER Height & Weight     Self, Time, Situation, Place  O2 (at 2L)  Continent Weight:   Height:     BEHAVIORAL SYMPTOMS/MOOD NEUROLOGICAL BOWEL NUTRITION STATUS   (NONE )  (NONE ) Continent Diet (Carb Modified )  AMBULATORY STATUS COMMUNICATION OF NEEDS Skin   Extensive Assist Verbally Surgical wounds (Unstageable Pressure Ulcer on buttocks )                       Personal Care Assistance Level of Assistance  Bathing, Feeding, Dressing Bathing Assistance: Limited assistance Feeding assistance: Independent Dressing Assistance: Limited assistance     Functional Limitations Info  Sight, Hearing, Speech Sight Info: Adequate Hearing Info: Adequate Speech Info: Adequate    SPECIAL CARE FACTORS FREQUENCY  PT (By licensed PT)     PT Frequency: 3              Contractures      Additional Factors Info  Code Status, Allergies Code Status Info: FULL CODE  Allergies Info: N/A           Current Medications (09/07/2015):  This is the current hospital active medication list Current Facility-Administered Medications  Medication Dose Route Frequency Provider Last Rate Last Dose  . 0.9 %  sodium chloride infusion   Intravenous Continuous Newt Minion, MD 10 mL/hr at 09/05/15 1328    . acetaminophen (TYLENOL) tablet 650 mg  650 mg Oral Q6H PRN Newt Minion, MD       Or  . acetaminophen (TYLENOL) suppository 650 mg  650 mg Rectal Q6H PRN Newt Minion, MD      . albuterol (PROVENTIL) (2.5 MG/3ML) 0.083% nebulizer  solution 2.5 mg  2.5 mg Inhalation Q6H PRN Collier Salina, MD      . apixaban Arne Cleveland) tablet 2.5 mg  2.5 mg Oral BID Shela Leff, MD   2.5 mg at 09/07/15 1037  . aspirin EC tablet 325 mg  325 mg Oral Q breakfast Newt Minion, MD   325 mg at 09/07/15 0751  . atorvastatin (LIPITOR) tablet 40 mg  40 mg Oral Daily Juliet Rude, MD   40 mg at 09/07/15 1037  . collagenase (SANTYL) ointment   Topical Daily Annia Belt, MD      . diltiazem (CARDIZEM CD) 24 hr capsule 120 mg  120 mg Oral Daily Collier Salina, MD   120  mg at 09/07/15 1038  . HYDROcodone-acetaminophen (NORCO/VICODIN) 5-325 MG per tablet 1-2 tablet  1-2 tablet Oral Q6H PRN Newt Minion, MD   1 tablet at 09/07/15 0748  . hydroxyurea (HYDREA) capsule 500 mg  500 mg Oral Daily Annia Belt, MD   500 mg at 09/07/15 1034  . insulin aspart (novoLOG) injection 0-5 Units  0-5 Units Subcutaneous QHS Collier Salina, MD   2 Units at 09/04/15 2220  . insulin aspart (novoLOG) injection 0-9 Units  0-9 Units Subcutaneous TID WC Collier Salina, MD   2 Units at 09/07/15 0751  . lactated ringers infusion   Intravenous Continuous Albertha Ghee, MD 10 mL/hr at 09/05/15 0825    . menthol-cetylpyridinium (CEPACOL) lozenge 3 mg  1 lozenge Oral PRN Newt Minion, MD       Or  . phenol (CHLORASEPTIC) mouth spray 1 spray  1 spray Mouth/Throat PRN Newt Minion, MD      . metoCLOPramide (REGLAN) tablet 5-10 mg  5-10 mg Oral Q8H PRN Newt Minion, MD       Or  . metoCLOPramide (REGLAN) injection 5-10 mg  5-10 mg Intravenous Q8H PRN Newt Minion, MD      . metoprolol succinate (TOPROL-XL) 24 hr tablet 25 mg  25 mg Oral Daily Collier Salina, MD   25 mg at 09/07/15 1036  . ondansetron (ZOFRAN) tablet 4 mg  4 mg Oral Q6H PRN Newt Minion, MD       Or  . ondansetron [facility]) injection 4 mg  4 mg Intravenous Q6H PRN Newt Minion, MD      . sertraline (ZOLOFT) tablet 50 mg  50 mg Oral Daily Juliet Rude, MD   50 mg at 09/07/15 1037     Discharge Medications: Please see discharge summary for a list of discharge medications.  Relevant Imaging Results:  Relevant Lab Results:   Additional Information SSN [id number]  Glendon Axe, MSW, LCSWA [phone] 09/07/2015 11:33 AM

## 2015-09-07 NOTE — Discharge Summary (Signed)
Name: Theresa Bird MRN: [id number] DOB: Jan 10, 1934 82 y.o. PCP: Neale Burly, MD  Date of Admission: 09/04/2015  1:28 PM Date of Discharge: 09/07/2015 Attending Physician: Bartholomew Crews, MD  Discharge Diagnosis:  Principal Problem:   Closed fracture of left hip ([place]) Active Problems:   DM2 (diabetes mellitus, type 2) ([place])   Dyslipidemia   CORONARY ATHEROSCLEROSIS NATIVE CORONARY ARTERY   Paroxysmal atrial fibrillation ([place])   Essential hypertension   Pressure ulcer   Hip fracture, left (HCC)   PVD (peripheral vascular disease) ([place])   Diabetic ulcer of right foot associated with diabetes mellitus due to underlying condition [facility])  Discharge Medications:   Medication List    STOP taking these medications        insulin NPH-regular Human (70-30) 100 UNIT/ML injection  Commonly known as:  NOVOLIN 70/30     rivaroxaban 20 MG Tabs tablet  Commonly known as:  XARELTO      TAKE these medications        acetaminophen 500 MG tablet  Commonly known as:  TYLENOL  Take 1 tablet (500 mg total) by mouth every 6 (six) hours as needed for mild pain.     alendronate 35 MG tablet  Commonly known as:  FOSAMAX  Take 1 tablet (35 mg total) by mouth every 7 (seven) days. Take with a full glass of water on an empty stomach.     apixaban 2.5 MG Tabs tablet  Commonly known as:  ELIQUIS  Take 1 tablet (2.5 mg total) by mouth 2 (two) times daily.     aspirin EC 81 MG tablet  Take 81 mg by mouth daily.     atorvastatin 40 MG tablet  Commonly known as:  LIPITOR  Take 1 tablet (40 mg total) by mouth daily at 6 PM.     calcium carbonate 600 MG tablet  Commonly known as:  OS-CAL  Take 1 tablet (600 mg total) by mouth 2 (two) times daily with a meal.     CARTIA XT 120 MG 24 hr capsule  Generic drug:  diltiazem  Take 120 mg by mouth daily.     cephALEXin 500 MG capsule  Commonly known as:  KEFLEX  Take 1 capsule (500 mg total) by mouth 3 (three) times daily.     cholecalciferol 1000 units tablet  Commonly known as:  VITAMIN D  Take 1 tablet (1,000 Units total) by mouth daily.     fentaNYL 25 MCG/HR patch  Commonly known as:  DURAGESIC - dosed mcg/hr  Place 25 mcg onto the skin every 3 (three) days.     furosemide 20 MG tablet  Commonly known as:  LASIX  Take 20 mg by mouth.     hydroxyurea 500 MG capsule  Commonly known as:  HYDREA  Take 500 mg by mouth daily.     MELATIN PO  Take 3 mg by mouth at bedtime.     metoprolol succinate 25 MG 24 hr tablet  Commonly known as:  TOPROL-XL  Take 25 mg by mouth daily.     NOVOLOG MIX 70/30 FLEXPEN (70-30) 100 UNIT/ML FlexPen  Generic drug:  insulin aspart protamine - aspart  Inject 15 Units into the skin 2 (two) times daily.     oxyCODONE-acetaminophen 5-325 MG tablet  Commonly known as:  PERCOCET/ROXICET  Take 1 tablet by mouth every 4 (four) hours as needed for severe pain.     OXYGEN  Inhale 2 L into the lungs.  PROAIR HFA 108 (90 Base) MCG/ACT inhaler  Generic drug:  albuterol  Inhale 2 puffs into the lungs every 6 (six) hours as needed for wheezing or shortness of breath.     sertraline 50 MG tablet  Commonly known as:  ZOLOFT  Take 50 mg by mouth daily.     traMADol 50 MG tablet  Commonly known as:  ULTRAM  Take 50 mg by mouth every 6 (six) hours as needed for moderate pain.     vitamin C 500 MG tablet  Commonly known as:  ASCORBIC ACID  Take 500 mg by mouth 2 (two) times daily.        Disposition and follow-up:   [name] was discharged from [facility] in Good condition.  At the hospital follow up visit please address:  1. Closed fracture of left hip Paroxysmal atrial fibrillation   -Patient has been started on Eliquis 2.5 mg twice daily   2.  Labs / imaging needed at time of follow-up: DEXA scan   3.  Pending labs/ test needing follow-up:   Follow-up Appointments: Follow-up Information    Follow up with DUDA,MARCUS V, MD In 2  weeks.   Specialty:  Orthopedic Surgery   Contact information:   [place] [id number] [phone]       Follow up with Neale Burly, MD. Go on 09/14/2015.   Specialty:  Internal Medicine   Why:  Follow up appointment at 12:45 pm.    Contact information:   [place] [phone]       Follow up with [facility].   Specialty:  [place] information:   [address] [phone]      Discharge Instructions: Discharge Instructions    Weight bearing as tolerated    Complete by:  As directed   Laterality:  left  Extremity:  Lower           Consultations:    Orthopedic surgery (Dr. Sharol Given)  Procedures Performed:  Dg Chest Port 1 View  09/07/2015  CLINICAL DATA:  Dyspnea, hx: left hip surgery yesterday EXAM: PORTABLE CHEST - 1 VIEW COMPARISON:  09/04/2015 FINDINGS: Attenuated bronchovascular markings peripherally in the right lung as before. No focal infiltrate or overt edema. Heart size normal. No pneumothorax. No effusion. Atheromatous aorta. Left carotid calcifications. Previous median sternotomy. IMPRESSION: No acute disease post median sternotomy. Electronically Signed   By: Lucrezia Europe M.D.   On: 09/07/2015 10:54   Dg Chest Portable 1 View  09/04/2015  CLINICAL DATA:  Left hip fracture. Pre-op respiratory exam diabetes, atrial fibrillation, and coronary artery disease. EXAM: PORTABLE CHEST 1 VIEW COMPARISON:  07/23/2015 FINDINGS: Heart size is within normal limits. Both lungs are clear. No evidence of pneumothorax or pleural effusion. Lordotic positioning noted. Previous median sternotomy noted. IMPRESSION: No active disease. Electronically Signed   By: Earle Gell M.D.   On: 09/04/2015 15:15   Dg Hip Operative Unilat W Or W/o Pelvis Left  09/05/2015  CLINICAL DATA:  Left hip fracture. EXAM: OPERATIVE LEFT HIP (WITH PELVIS IF PERFORMED) 2 VIEWS TECHNIQUE:  Fluoroscopic spot image(s) were submitted for interpretation post-operatively. COMPARISON:  None. FINDINGS: Nail and intramedullary rod fixation of intertrochanteric hip fracture is seen which is in near anatomic alignment. IMPRESSION: Internal fixation of intertrochanteric left hip fracture in near anatomic alignment. Electronically Signed   By: Earle Gell  M.D.   On: 09/05/2015 11:56   Dg Hip Unilat With Pelvis 2-3 Views Left  09/04/2015  CLINICAL DATA:  Fall with left hip pain EXAM: DG HIP (WITH OR WITHOUT PELVIS) 2-3V LEFT COMPARISON:  02/25/2010 CT abdomen/pelvis FINDINGS: There is a markedly comminuted intertrochanteric left proximal femur fracture with mild apex lateral angulation and no significant displacement. No additional fracture. No dislocation at the hip joints. Mild osteoarthritis in both hip joints. Degenerative changes in the visualized lower lumbar spine. IMPRESSION: Markedly comminuted intratrochanteric left proximal femur fracture. Electronically Signed   By: Ilona Sorrel M.D.   On: 09/04/2015 14:55    Admission HPI: 82 y/o woman with PMHx significant for IDDM, CAD s/p CABG 2002, Afib, essential thrombocytosis, myelofibrosis, and PAD presented to the emergency department after tripping over her wheelchair and landing on her left hip. She was currently undergoing rehabilitation at the [facility] in [place] after recent surgery on her right lower extremity ulcers 2/2 peripheral vascular disease. Prior to this episode she was feeling at her baseline health and walking with a of a walker for most short distances and in her room. She denies any dizziness, weakness, or loss of consciousness associated with her fall. She has intact sensation and strength in her legs. She is currently wearing a right leg boot with wound dressings in place over her right foot, and this is the extremity that she caught on the wheelchair.  After arrival in the ED she had received intravenous narcotics with excellent  pain relief. Plain film imaging of the left hip demonstrated a comminuted intratrochanteric fracture of the left proximal femur. She was also incidentallly noted to have a sacral decubitus ulcer POA.  Of note she was falling earlier this year before undergoing surgery for an ulcer of the right leg. One fall caused a fracture of 4 ribs. She was discontinued from her Xarelto for Afib due to concern of her bleeding risk with these increasingly frequent falls. She also has recently had hematuria 2/2 large kidney stone.  Hospital Course by problem list: Principal Problem:   Closed fracture of left hip ([place]) Active Problems:   DM2 (diabetes mellitus, type 2) (HCC)   Dyslipidemia   CORONARY ATHEROSCLEROSIS NATIVE CORONARY ARTERY   Paroxysmal atrial fibrillation (HCC)   Essential hypertension   Pressure ulcer   Hip fracture, left (HCC)   PVD (peripheral vascular disease) (HCC)   Diabetic ulcer of right foot associated with diabetes mellitus due to underlying condition ([place])   Closed fracture of the left hip: Patient presented from a skilled nursing facility after a mechanical fall. X-ray of hip showing a markedly comminuted intratrochanteric left proximal femur fracture. She was seen by orthopedic surgery and underwent intramedullary nail intertrochanteric fixation on 09/05/15. She was started on Eliquis for anticoagulation in the setting of recent hip surgery, history of paroxysmal atrial fibrillation, and history of essential thrombocythemia. She has also been started on a calcium supplement, vitamin D supplement, and a bisphosphonate. Patient is stable and will be discharged to SNF today. She has been scheduled to follow up with orthopedic surgery and her PCP as outpatient.  -Suggest outpatient DEXA scan   Paroxysmal Afib: She has been in and out of Afib with RVR numerous times since 2014 with DCCV x3 times but unable to maintain sinus rhythm. She is not an ablation candidate per cardiology office note  reports. She was no longer on anticoagulation. Patient was started on Eliquis in the setting of recent hip surgery and history  of essential thrombocythemia.   Discharge Vitals:   BP 104/40 mmHg  Pulse 99  Temp(Src) 99.4 F (37.4 C) (Oral)  Resp 16  SpO2 90%  Discharge Labs:  Results for orders placed or performed during the hospital encounter of 09/04/15 (from the past 24 hour(s))  Glucose, capillary     Status: Abnormal   Collection Time: 09/06/15  4:23 PM  Result Value Ref Range   Glucose-Capillary 221 (H) 65 - 99 mg/dL   Comment 1 Notify RN   Glucose, capillary     Status: Abnormal   Collection Time: 09/06/15  9:44 PM  Result Value Ref Range   Glucose-Capillary 170 (H) 65 - 99 mg/dL  CBC     Status: Abnormal   Collection Time: 09/07/15  3:59 AM  Result Value Ref Range   WBC 22.6 (H) 4.0 - 10.5 K/uL   RBC 2.57 (L) 3.87 - 5.11 MIL/uL   Hemoglobin 9.5 (L) 12.0 - 15.0 g/dL   HCT 29.1 (L) 36.0 - 46.0 %   MCV 113.2 (H) 78.0 - 100.0 fL   MCH 37.0 (H) 26.0 - 34.0 pg   MCHC 32.6 30.0 - 36.0 g/dL   RDW 19.4 (H) 11.5 - 15.5 %   Platelets 764 (H) 150 - 400 K/uL  Basic metabolic panel     Status: Abnormal   Collection Time: 09/07/15  3:59 AM  Result Value Ref Range   Sodium 131 (L) 135 - 145 mmol/L   Potassium 4.1 3.5 - 5.1 mmol/L   Chloride 92 (L) 101 - 111 mmol/L   CO2 32 22 - 32 mmol/L   Glucose, Bld 152 (H) 65 - 99 mg/dL   BUN 7 6 - 20 mg/dL   Creatinine, Ser 0.58 0.44 - 1.00 mg/dL   Calcium 8.4 (L) 8.9 - 10.3 mg/dL   GFR calc non Af Amer >60 >60 mL/min   GFR calc Af Amer >60 >60 mL/min   Anion gap 7 5 - 15  Glucose, capillary     Status: Abnormal   Collection Time: 09/07/15  7:48 AM  Result Value Ref Range   Glucose-Capillary 172 (H) 65 - 99 mg/dL  Glucose, capillary     Status: Abnormal   Collection Time: 09/07/15 11:00 AM  Result Value Ref Range   Glucose-Capillary 168 (H) 65 - 99 mg/dL   Comment 1 Notify RN     Signed: Shela Leff, MD 09/07/2015, 2:46 PM      Services Ordered on Discharge:  Equipment Ordered on Discharge:

## 2015-09-07 NOTE — Care Management Important Message (Signed)
Important Message  Patient Details  Name: Theresa Bird MRN: [id number] Date of Birth: July 29, 1933   Medicare Important Message Given:  Yes    [name] P Sudiksha Victor 09/07/2015, 1:33 PM

## 2015-09-07 NOTE — Progress Notes (Signed)
  Date: 09/07/2015  Patient name: Theresa Bird  Medical record number: [id number]  Date of birth: 06-03-1934   This patient has been seen and the plan of care was discussed with the house staff. Please see their note for complete details. I concur with their findings with the following additions/corrections: MS Mohabir is a lovely 82 yo now s/o hip fracture repair. She had a fragility fall and has also broken ribs in falls. She went from her home (multiple falls) to SNF (only this one fall) for rehab and is doing well with a walker. She meets definition of osteoporosis 2/2 fragility fx and will be started on PO bisphosphonates, calcium, and Vit D. DEXA as an outpt is rec. She is on a DAOC for A Fib, MDS, and thrombocytehmia.  D/C to SNF  Bartholomew Crews, MD 09/07/2015, 2:31 PM

## 2015-09-09 ENCOUNTER — Telehealth: Payer: Self-pay

## 2015-09-09 NOTE — Telephone Encounter (Signed)
rec'd phone call from the [facility].  Reported that the pt. [place] and broke her left hip; is s/p repair of left hip fx from 09/05/15.  Questioned if Dr. Trula Slade is aware of the recent surgery, and if the plan is to proceed with right leg bypass on 4/21, or to postpone this.  Advised will contact Dr. Trula Slade and request recommendation.

## 2015-09-14 ENCOUNTER — Encounter: Payer: Self-pay | Admitting: Cardiology

## 2015-09-14 ENCOUNTER — Telehealth: Payer: Self-pay | Admitting: Surgery

## 2015-09-14 ENCOUNTER — Ambulatory Visit (INDEPENDENT_AMBULATORY_CARE_PROVIDER_SITE_OTHER): Payer: Medicare Other | Admitting: Cardiology

## 2015-09-14 VITALS — BP 107/73 | HR 101 | Ht 69.0 in | Wt 156.0 lb

## 2015-09-14 DIAGNOSIS — E785 Hyperlipidemia, unspecified: Secondary | ICD-10-CM

## 2015-09-14 DIAGNOSIS — I4819 Other persistent atrial fibrillation: Secondary | ICD-10-CM

## 2015-09-14 DIAGNOSIS — I2510 Atherosclerotic heart disease of native coronary artery without angina pectoris: Secondary | ICD-10-CM | POA: Diagnosis not present

## 2015-09-14 DIAGNOSIS — Z01810 Encounter for preprocedural cardiovascular examination: Secondary | ICD-10-CM

## 2015-09-14 DIAGNOSIS — I481 Persistent atrial fibrillation: Secondary | ICD-10-CM | POA: Diagnosis not present

## 2015-09-14 MED ORDER — DILTIAZEM HCL ER COATED BEADS 180 MG PO CP24: 180.0000 mg | ORAL_CAPSULE | Freq: Every day | ORAL | Status: DC

## 2015-09-14 NOTE — Progress Notes (Signed)
Cardiology Office Note  Date: 09/14/2015   ID: Theresa Bird, DOB 07/13/1933, MRN [id number]  PCP: Neale Burly, MD  Primary Cardiologist: Rozann Lesches, MD   Chief Complaint  Patient presents with  . Coronary Artery Disease  . Atrial Fibrillation    History of Present Illness: Theresa Bird is a medically complex 82 y.o. female last seen in February. Interval history reviewed. She was seen by [facility] for evaluation of a right foot ulcer and ultimately underwent aortography with lower extremity angiography. No PCI options identified at that time, possibly to be considered for surgical revascularization. She was subsequently admitted to the hospital after a mechanical fall (in rehabilitation at the [facility]). She sustained an intratrochanteric fracture of the left proximal femur which was repaired with an intramedullary nail. Anticoagulation (for atrial fibrillation) was temporarily interrupted, she was sent home on Eliquis.  She comes in today with her daughter. She is in a wheelchair, is able to stand and walk a little bit with physical therapy. I reviewed the chart, Dr. Trula Slade has scheduled a right femoropopliteal bypass for April 21 under general anesthesia address her PAD and limb ischemia.  She denies any angina symptoms, reports NYHA class II dyspnea with limited activity now. She does have reflux symptoms which bother her at night time. Last ischemic evaluation was within the last 3 years, low risk Cardiolite study in June 2013. I reviewed her most recent ECG. Echocardiogram from September 2015 showed LVEF 60-65%.  Past Medical History  Diagnosis Date  . Depression   . Essential hypertension, benign   . Hypercholesteremia   . Insulin dependent diabetes mellitus ([place])   . Essential thrombocytosis ([facility])     JAK2 negative  . Coronary atherosclerosis of native coronary artery     Multivessel status post CABG in [place]  . Atrial fibrillation ([place])   . Myelofibrosis  ([place])   . Seizure ([place])   . PAD (peripheral artery disease) [facility])     Past Surgical History  Procedure Laterality Date  . Abdominal hysterectomy    . Cataract extraction    . Coronary artery bypass graft      Possibly 2002 in [place]  . Cardioversion N/A 09/09/2014    Procedure: CARDIOVERSION;  Surgeon: Josue Hector, MD;  Location: [facility] ENDOSCOPY;  Service: Cardiovascular;  Laterality: N/A;  . Cardioversion N/A 10/03/2014    Procedure: CARDIOVERSION;  Surgeon: Arnoldo Lenis, MD;  Location: [facility] ORS;  Service: Endoscopy;  Laterality: N/A;  . Peripheral vascular catheterization N/A 08/27/2015    Procedure: Abdominal Aortogram w/Lower Extremity;  Surgeon: Conrad [place], MD;  Location: [place] CV LAB;  Service: Cardiovascular;  Laterality: N/A;  . Intramedullary (im) nail intertrochanteric Left 09/05/2015    Procedure: INTRAMEDULLARY (IM) NAIL INTERTROCHANTRIC;  Surgeon: Newt Minion, MD;  Location: [facility];  Service: Orthopedics;  Laterality: Left;    Current Outpatient Prescriptions  Medication Sig Dispense Refill  . acetaminophen (TYLENOL) 500 MG tablet Take 1 tablet (500 mg total) by mouth every 6 (six) hours as needed for mild pain. 30 tablet 0  . albuterol (PROAIR HFA) 108 (90 BASE) MCG/ACT inhaler Inhale 2 puffs into the lungs every 6 (six) hours as needed for wheezing or shortness of breath.    Marland Kitchen alendronate (FOSAMAX) 35 MG tablet Take 1 tablet (35 mg total) by mouth every 7 (seven) days. Take with a full glass of water on an empty stomach. 4 tablet 0  . apixaban (ELIQUIS) 2.5 MG  TABS tablet Take 1 tablet (2.5 mg total) by mouth 2 (two) times daily. 60 tablet 0  . aspirin EC 81 MG tablet Take 81 mg by mouth daily.    Marland Kitchen atorvastatin (LIPITOR) 40 MG tablet Take 40 mg by mouth every evening.    . calcium carbonate (OS-CAL) 600 MG tablet Take 1 tablet (600 mg total) by mouth 2 (two) times daily with a meal. 60 tablet 0  . cholecalciferol (VITAMIN D) 1000 units tablet Take 1 tablet (1,000  Units total) by mouth daily. 30 tablet 0  . fentaNYL (DURAGESIC - DOSED MCG/HR) 25 MCG/HR patch Place 25 mcg onto the skin every 3 (three) days.    . furosemide (LASIX) 20 MG tablet Take 20 mg by mouth daily.     . hydroxyurea (HYDREA) 500 MG capsule Take 500 mg by mouth 2 (two) times daily.     . insulin aspart protamine - aspart (NOVOLOG MIX 70/30 FLEXPEN) (70-30) 100 UNIT/ML FlexPen Inject 15 Units into the skin 2 (two) times daily.    . Melatonin-Pyridoxine (MELATIN PO) Take 3 mg by mouth at bedtime.    . metoprolol succinate (TOPROL-XL) 25 MG 24 hr tablet Take 25 mg by mouth daily.    Marland Kitchen oxyCODONE-acetaminophen (PERCOCET/ROXICET) 5-325 MG tablet Take 1 tablet by mouth every 4 (four) hours as needed for severe pain. 30 tablet 0  . OXYGEN Inhale 2 L into the lungs.    . sertraline (ZOLOFT) 50 MG tablet Take 50 mg by mouth daily.    . traMADol (ULTRAM) 50 MG tablet Take 50 mg by mouth every 6 (six) hours as needed for moderate pain.    . vitamin C (ASCORBIC ACID) 500 MG tablet Take 500 mg by mouth 2 (two) times daily.     No current facility-administered medications for this visit.   Allergies:  Review of patient's allergies indicates no known allergies.   Social History: The patient  reports that she has never smoked. She has never used smokeless tobacco. She reports that she drinks alcohol. She reports that she does not use illicit drugs.   ROS:  Please see the history of present illness. Otherwise, complete review of systems is positive for improving right hip pain and stiffness.  All other systems are reviewed and negative.   Physical Exam: VS:  BP 107/73 mmHg  Pulse 101  Ht 5\' 9"  (1.753 m)  Wt 156 lb (70.761 kg)  BMI 23.03 kg/m2  SpO2 94%, BMI Body mass index is 23.03 kg/(m^2).  Wt Readings from Last 3 Encounters:  09/14/15 156 lb (70.761 kg)  08/26/15 152 lb (68.947 kg)  08/17/15 155 lb (70.308 kg)    Chronically ill-appearing elderly woman in no distress, sitting in  wheelchair. HEENT: Conjunctiva and lids normal, oropharynx clear.  Neck: Supple, no elevated JVP or carotid bruits, no thyromegaly.  Lungs: Diminished breath sounds, clear, nonlabored breathing at rest.  Cardiac: Irregular, no S3 or significant systolic murmur, no pericardial rub.  Abdomen: Soft, nontender, bowel sounds present.  Extremities: No pitting edema, distal pulses 1+.  Skin: Warm and dry. Next line musculoskeletal: No kyphosis or Neuropsychiatric: Alert and oriented 3, affect appropriate.  ECG: I personally reviewed the prior tracing from 09/06/2015 which showed atrial fibrillation with low voltage in the limb leads, nonspecific ST-T changes.   Recent Labwork: 10/14/2014: ALT 15; AST 22; TSH 1.906 09/07/2015: BUN 7; Creatinine, Ser 0.58; Hemoglobin 9.5*; Platelets 764*; Potassium 4.1; Sodium 131*     Component Value Date/Time  CHOL 145 09/09/2014 1350   TRIG 79 09/09/2014 1350   HDL 29* 09/09/2014 1350   CHOLHDL 5.0 09/09/2014 1350   VLDL 16 09/09/2014 1350   LDLCALC 100* 09/09/2014 1350    Other Studies Reviewed Today:  Aortogram with lower extremity angiography 08/27/2015:  Diffuse calcification of much of the arterial system  Patent aortoiliac system  Right distal SFA occlusion with unfavorable CTO for endovascular intervention  Right proximal AT stenosis >90%: only runoff to right foot  Patent L SFA with distal stenosis 50%  Extensive L tibial artery disease with only peroneal runoff  Chest x-ray 09/07/2015: FINDINGS: Attenuated bronchovascular markings peripherally in the right lung as before. No focal infiltrate or overt edema. Heart size normal. No pneumothorax. No effusion. Atheromatous aorta. Left carotid calcifications. Previous median sternotomy.  IMPRESSION: No acute disease post median sternotomy.  Assessment and Plan:  1. Preoperative evaluation in an 82 year old woman with multivessel CAD status post CABG in 2002 (low risk ischemic  workup within the last 3 years), no active angina symptoms with limited activity on medical therapy, hypertension, persistent atrial fibrillation, and PAD. She is being considered for right femoral to popliteal bypass grafting under general anesthesia by Dr. Trula Slade. Overall intermediate risk for perioperative cardiac complications. Would continue regular medical therapy for blood pressure and heart rate control throughout. She also needs to be on telemetry.  2. Persistent/chronic atrial fibrillation. Continue strategy of heart rate control and anticoagulation. Increasing Cartia XT to 180 mg daily, otherwise continue current dose of Toprol-XL. Should be able to hold Eliquis 48 hours prior to surgery and then reinstitute postoperatively when there is appropriate hemostasis and no anticipated further invasive evaluation.  3. Essential hypertension, blood pressure control is good today.  4. Hyperlipidemia, on Lipitor.  Current medicines were reviewed with the patient today.  Disposition: FU with me in 3 months.   Signed, Satira Sark, MD, [facility] 09/14/2015 4:26 PM    [facility] [place], [place], [place] [address] Phone: [phone]; Fax: [phone]

## 2015-09-14 NOTE — Telephone Encounter (Signed)
Serafina Mitchell, MD  Denman George, RN           Have her see me in the office so we can discuss. She will need clearance by Dr. Sharol Given

## 2015-09-14 NOTE — Telephone Encounter (Signed)
sched appt for 09/21/15 at 3:45. Lm on hm# and w/ [facility] [phone] to inform pt of appt.  Faxed clearance to Dr. Jess Barters office, fax # [phone]

## 2015-09-14 NOTE — Patient Instructions (Signed)
Your physician has recommended you make the following change in your medication:  Increase cartia xt to 180 mg daily. Continue all other medications the same. Your physician recommends that you schedule a follow-up appointment in: 3 months.

## 2015-09-14 NOTE — Telephone Encounter (Signed)
-----   Message from Denman George, RN sent at 09/14/2015 10:15 AM EDT ----- Regarding: needs appt. with [facility] on 4/17 or 4/18 Please schedule an appt. With Dr. Trula Slade next week.  Also will need to send Dr. Sharol Given a request for clearance for right leg bypass on 4/21. (had left hip repair on 4/1 per Dr. Sharol Given) ----- Message -----    From: Serafina Mitchell, MD    Sent: 09/10/2015  10:02 PM      To: Denman George, RN Subject: RE: need recommendation on proceeding with l#  Have her see me in the office so we can discuss.  She will need clearance by Dr. Sharol Given ----- Message -----    From: Denman George, RN    Sent: 09/09/2015   4:05 PM      To: Serafina Mitchell, MD Subject: need recommendation on proceeding with leg b#  Please advise on proceeding with BP surg. Or postponing until recovered from repair left hip fx. On 4/1.  Per my Triage note today:   Rec'd phone call from the [facility]. Reported that the pt. fell and broke her left hip; is s/p repair of left hip fx from 09/05/15. Questioned if Dr. Trula Slade is aware of the recent surgery, and if the plan is to proceed with right leg bypass on 4/21, or to postpone this. Advised will contact Dr. Trula Slade and request recommendation.

## 2015-09-16 ENCOUNTER — Other Ambulatory Visit (HOSPITAL_COMMUNITY): Payer: Medicare Other

## 2015-09-16 ENCOUNTER — Encounter (HOSPITAL_COMMUNITY)
Admission: RE | Admit: 2015-09-16 | Discharge: 2015-09-16 | Disposition: A | Discharge status: Home / Self Care | Payer: No Typology Code available for payment source | Source: Ambulatory Visit | Attending: Surgery | Admitting: Surgery

## 2015-09-16 ENCOUNTER — Encounter (HOSPITAL_COMMUNITY): Payer: Self-pay

## 2015-09-16 ENCOUNTER — Encounter: Payer: Self-pay | Admitting: Surgery

## 2015-09-16 ENCOUNTER — Telehealth: Payer: Self-pay

## 2015-09-16 DIAGNOSIS — Z0183 Encounter for blood typing: Secondary | ICD-10-CM | POA: Insufficient documentation

## 2015-09-16 DIAGNOSIS — E119 Type 2 diabetes mellitus without complications: Secondary | ICD-10-CM | POA: Insufficient documentation

## 2015-09-16 DIAGNOSIS — Z7982 Long term (current) use of aspirin: Secondary | ICD-10-CM | POA: Insufficient documentation

## 2015-09-16 DIAGNOSIS — Z7901 Long term (current) use of anticoagulants: Secondary | ICD-10-CM | POA: Diagnosis not present

## 2015-09-16 DIAGNOSIS — Z01818 Encounter for other preprocedural examination: Secondary | ICD-10-CM | POA: Diagnosis present

## 2015-09-16 DIAGNOSIS — Z01812 Encounter for preprocedural laboratory examination: Secondary | ICD-10-CM | POA: Insufficient documentation

## 2015-09-16 DIAGNOSIS — I10 Essential (primary) hypertension: Secondary | ICD-10-CM | POA: Insufficient documentation

## 2015-09-16 DIAGNOSIS — Z794 Long term (current) use of insulin: Secondary | ICD-10-CM | POA: Insufficient documentation

## 2015-09-16 DIAGNOSIS — I4891 Unspecified atrial fibrillation: Secondary | ICD-10-CM | POA: Insufficient documentation

## 2015-09-16 DIAGNOSIS — E785 Hyperlipidemia, unspecified: Secondary | ICD-10-CM | POA: Insufficient documentation

## 2015-09-16 DIAGNOSIS — D473 Essential (hemorrhagic) thrombocythemia: Secondary | ICD-10-CM | POA: Diagnosis not present

## 2015-09-16 DIAGNOSIS — I739 Peripheral vascular disease, unspecified: Secondary | ICD-10-CM | POA: Diagnosis not present

## 2015-09-16 DIAGNOSIS — I2510 Atherosclerotic heart disease of native coronary artery without angina pectoris: Secondary | ICD-10-CM | POA: Insufficient documentation

## 2015-09-16 DIAGNOSIS — Z79899 Other long term (current) drug therapy: Secondary | ICD-10-CM | POA: Insufficient documentation

## 2015-09-16 DIAGNOSIS — J449 Chronic obstructive pulmonary disease, unspecified: Secondary | ICD-10-CM | POA: Diagnosis not present

## 2015-09-16 DIAGNOSIS — Z951 Presence of aortocoronary bypass graft: Secondary | ICD-10-CM | POA: Insufficient documentation

## 2015-09-16 HISTORY — DX: Acute myocardial infarction, unspecified: I21.9

## 2015-09-16 HISTORY — DX: Chronic obstructive pulmonary disease, unspecified: J44.9

## 2015-09-16 HISTORY — DX: Personal history of urinary calculi: Z87.442

## 2015-09-16 LAB — COMPREHENSIVE METABOLIC PANEL
ALBUMIN: 3 g/dL — AB (ref 3.5–5.0)
ALT: 24 U/L (ref 14–54)
ANION GAP: 10 (ref 5–15)
AST: 32 U/L (ref 15–41)
Alkaline Phosphatase: 106 U/L (ref 38–126)
BUN: 15 mg/dL (ref 6–20)
CHLORIDE: 97 mmol/L — AB (ref 101–111)
CO2: 28 mmol/L (ref 22–32)
Calcium: 8.6 mg/dL — ABNORMAL LOW (ref 8.9–10.3)
Creatinine, Ser: 0.69 mg/dL (ref 0.44–1.00)
GFR calc Af Amer: >60 mL/min (ref >60)
GFR calc non Af Amer: >60 mL/min (ref >60)
GLUCOSE: 178 mg/dL — AB (ref 65–99)
Potassium: 4.1 mmol/L (ref 3.5–5.1)
SODIUM: 135 mmol/L (ref 135–145)
Total Bilirubin: 1.1 mg/dL (ref 0.3–1.2)
Total Protein: 5.7 g/dL — ABNORMAL LOW (ref 6.5–8.1)

## 2015-09-16 LAB — CBC
HEMATOCRIT: 35.3 % — AB (ref 36.0–46.0)
Hemoglobin: 11.3 g/dL — ABNORMAL LOW (ref 12.0–15.0)
MCH: 36.7 pg — ABNORMAL HIGH (ref 26.0–34.0)
MCHC: 32 g/dL (ref 30.0–36.0)
MCV: 114.6 fL — ABNORMAL HIGH (ref 78.0–100.0)
PLATELETS: 1011 10*3/uL — AB (ref 150–400)
RBC: 3.08 MIL/uL — ABNORMAL LOW (ref 3.87–5.11)
RDW: 19.2 % — AB (ref 11.5–15.5)
WBC: 21 10*3/uL — AB (ref 4.0–10.5)

## 2015-09-16 LAB — TYPE AND SCREEN
ABO/RH(D): A POS
Antibody Screen: NEGATIVE

## 2015-09-16 LAB — PROTIME-INR
INR: 1.61 — ABNORMAL HIGH (ref 0.00–1.49)
Prothrombin Time: 19.2 s — ABNORMAL HIGH (ref 11.6–15.2)

## 2015-09-16 LAB — GLUCOSE, CAPILLARY: GLUCOSE-CAPILLARY: 181 mg/dL — AB (ref 65–99)

## 2015-09-16 LAB — APTT: APTT: 38 s — AB (ref 24–37)

## 2015-09-16 NOTE — Progress Notes (Signed)
Theresa Bird HAD SMALL AMOUNT BLOODY DRAINAGE ON HER LEFT THIGH.  Theresa Bird UNABLE TO STAND BUT DAUGHTER WILL HAVE [facility] EVALUATE.  LEFT MESSAGE FOR CAROL AT DR. BRABHAMS OFFICE REGARDING DR. MCDOWELL STATING Theresa Bird COULD STOP ELIQUIS 48 HOURS PRIOR TO SURGERY, NO MENTION OF ASPIRIN, WANTED TO BE SURE THIS PLAN WAS OKAY WITH DR. Trula Slade.  Theresa Bird'S DAUGHTER STATED THEY WOULD CHECK ON WHEN TO STOP ELIQUIS AND ASPIRIN AT APPOINTMENT WITH DR. Trula Slade NEXT WEEK .

## 2015-09-16 NOTE — Progress Notes (Signed)
CRITICAL PLATELET COUNT WAS CALLED TO CAROL AT DR. BRABHAM'S OFFICE (1011).

## 2015-09-16 NOTE — Telephone Encounter (Signed)
rec'd phone call from pre-op nurse.  Reported pt. has elevated platelet count of 1011; increased from 764 on 09/07/15.  Also noted pt. Is on Eliquis, and was advised by Dr. Domenic Polite to hold Eliquis 48 hrs. prior to her surgery.  Phone call to pt's. PCP,  Dr. Sherrie Sport.  Spoke with nurse, Amy.  Advised of pt's elevated platelet ct. of 1011.  Informed that per recent hospital notes, pt. Was to be referred to Hematology.  Per Amy, fax pt's recent lab work and hospital discharge note, and she will make Dr. Sherrie Sport aware of abnormal platelets.  Faxed the above information to [phone].

## 2015-09-16 NOTE — Pre-Procedure Instructions (Addendum)
Theresa Bird  09/16/2015      [facility] [address], [place] - [address] [place] [address] Phone: [phone] Fax: [phone]    Your procedure is scheduled on   Friday  09/25/15  Report to [facility] Admitting at 530 A.M.  Call this number if you have problems the morning of surgery:  [phone]   Remember:  Do not eat food or drink liquids after midnight.  Take these medicines the morning of surgery with A SIP OF WATER    ALBUTEROL, DILTIAZEM, HYDROXUREA, METOPROLOL (TOPROL), OXYCODONE OR TRAMADOL  IF NEEDED, SERTRALINE (ZOLOFT)             STOP ELIQUIS 48 HOURS PRIOR TO SURGERY, STOP ASPIRIN AS DIRECTED!!!   How to Manage Your Diabetes Before and After Surgery  Why is it important to control my blood sugar before and after surgery? . Improving blood sugar levels before and after surgery helps healing and can limit problems. . A way of improving blood sugar control is eating a healthy diet by: o  Eating less sugar and carbohydrates o  Increasing activity/exercise o  Talking with your doctor about reaching your blood sugar goals . High blood sugars (greater than 180 mg/dL) can raise your risk of infections and slow your recovery, so you will need to focus on controlling your diabetes during the weeks before surgery. . Make sure that the doctor who takes care of your diabetes knows about your planned surgery including the date and location.  How do I manage my blood sugar before surgery? . Check your blood sugar at least 4 times a day, starting 2 days before surgery, to make sure that the level is not too high or low. o Check your blood sugar the morning of your surgery when you wake up and every 2 hours until you get to the Short Stay unit. . If your blood sugar is less than 70 mg/dL, you will need to treat for low blood sugar: o Do not take insulin. o Treat a low blood sugar (less than 70 mg/dL) with  cup of clear juice (cranberry  or apple), 4 glucose tablets, OR glucose gel. o Recheck blood sugar in 15 minutes after treatment (to make sure it is greater than 70 mg/dL). If your blood sugar is not greater than 70 mg/dL on recheck, call [phone] for further instructions. . Report your blood sugar to the short stay nurse when you get to Short Stay.  . If you are admitted to the hospital after surgery: o Your blood sugar will be checked by the staff and you will probably be given insulin after surgery (instead of oral diabetes medicines) to make sure you have good blood sugar levels. o The goal for blood sugar control after surgery is 80-180 mg/dL.              WHAT DO I DO ABOUT MY DIABETES MEDICATION?   Marland Kitchen Do not take oral diabetes medicines (pills) the morning of surgery.  . THE NIGHT BEFORE SURGERY, take ___10________ units of ____70/30 NOVOLOG_______insulin.       . THE MORNING OF SURGERY, take _________NONE____ units of __________insulin.  . The day of surgery, do not take other diabetes injectables, including Byetta (exenatide), Bydureon (exenatide ER), Victoza (liraglutide), or Trulicity (dulaglutide).  . If your CBG is greater than 220 mg/dL, you may take  of your sliding scale (correction) dose of insulin.  Other  Instructions:          Patient Signature:  Date:   Nurse Signature:  Date:   Reviewed and Endorsed by [facility] Patient Education Committee, August 2015  Do not wear jewelry, make-up or nail polish.  Do not wear lotions, powders, or perfumes.  You may wear deodorant.  Do not shave 48 hours prior to surgery.  Men may shave face and neck.  Do not bring valuables to the hospital.  [facility] is not responsible for any belongings or valuables.  Contacts, dentures or bridgework may not be worn into surgery.  Leave your suitcase in the car.  After surgery it may be brought to your room.  For patients admitted to the hospital, discharge time will be determined by your  treatment team.  Patients discharged the day of surgery will not be allowed to drive home.   Name and phone number of your driver:   Special instructions:  SEE PREPARING FOR SURGERY  Please read over the following fact sheets that you were given. Pain Booklet, Coughing and Deep Breathing, Blood Transfusion Information, MRSA Information and Surgical Site Infection Prevention

## 2015-09-16 NOTE — Progress Notes (Signed)
SORES TO RIGHT FOOT WITH DRESSING.

## 2015-09-17 LAB — PATHOLOGIST SMEAR REVIEW

## 2015-09-17 NOTE — Progress Notes (Addendum)
Anesthesia Chart Review:  Pt is an 82 year old female scheduled for R fem-below knee popliteal bypass graft on 09/25/2015 with Dr. Trula Slade.   Hematologist is Dr. Audree Camel (care everywhere). PCP is Dr. Clide Deutscher. Cardiologist is Dr. Rozann Lesches who cleared pt for surgery at intermediate risk.   PMH includes:  CAD (s/p CABG 2002), atrial fibrillation, HTN, DM, hyperlipidemia, PAD, COPD, seizure (01/2015), essential thrombocytosis. never smoker. BMI 23. S/p IM nail L hip 09/05/15.   Medications include: albuterol eliquis, ASA, lipitor, diltiazem, lasix, fentanyl patch, hydroxyurea, novolog, metoprolol. Pt to stop eliquis 48 hours prior to surgery.   Preoperative labs reviewed.   - PTT 38, PT 19.2. Will recheck DOS.  -WBC 21.0.  Baseline WBC count not entirely clear; seems to range from normal to 15 based on prior labs.  - Platelets 1011. Pt has essential thrombocytosis and is followed by hematology, takes hydrea daily.   Chest x-ray 09/07/15 reviewed. No acute disease  EKG 09/06/15: NSR. Low voltage QRS. Nonspecific ST abnormality. Abnormal QRS-T angle, consider primary T wave abnormality  Echo 02/27/14:  - LV normal in size, mild LVH. Global LV wall motion and contractility WNL. EF 60-65%. Abnormal LV diastolic function.  - LA mildly dilated - aortic valve structure is normal. Mild aortic leaflet calcification. Moderate aortic cusp sclerosis. No regurgitation - Mild MR. Mitral annular calcification.   Nuclear stress test 11/21/11:  1. No definite evidence for myocardial reversibility. Basilar reversibility is nonspecific but likely due to non-uniform soft tissue attenuation artifact 2. Normal LV systolic function. EF 54%  Willeen Cass, FNP-BC [facility] Short Stay Surgical Center/Anesthesiology Phone: [phone] 09/17/2015 4:21 PM  Addendum: Billee Cashing in Dr. Stephens Shire office of elevated WBC count. Dr. Trula Slade would like CBC rechecked DOS. Order entered.    Pt with elevated  WBC count and history of lower extremity wounds. Pt will need further assessment by assigned anesthesiologist DOS.   Willeen Cass, FNP-BC [facility] Short Stay Surgical Center/Anesthesiology Phone: [phone] 09/21/2015 3:10 PM

## 2015-09-21 ENCOUNTER — Other Ambulatory Visit: Payer: Self-pay

## 2015-09-21 ENCOUNTER — Ambulatory Visit (INDEPENDENT_AMBULATORY_CARE_PROVIDER_SITE_OTHER): Payer: Medicare Other | Admitting: Surgery

## 2015-09-21 ENCOUNTER — Encounter: Payer: Self-pay | Admitting: Surgery

## 2015-09-21 VITALS — BP 99/57 | HR 100 | Temp 97.1°F | Resp 14 | Ht 68.0 in | Wt 148.0 lb

## 2015-09-21 DIAGNOSIS — I70234 Atherosclerosis of native arteries of right leg with ulceration of heel and midfoot: Secondary | ICD-10-CM

## 2015-09-21 DIAGNOSIS — I2510 Atherosclerotic heart disease of native coronary artery without angina pectoris: Secondary | ICD-10-CM

## 2015-09-21 NOTE — Progress Notes (Signed)
[facility] [place]  Patient name: Theresa Bird MRN: [id number] DOB: 1933-12-04 Sex: female  REASON FOR VISIT: pre-operative evaluation right leg revascularization  HPI: Theresa Bird is a 82 y.o. female who presents for evaluation prior to right femoral popliteal bypass on 09/25/2015 for a right foot ulcer. This ulcer has been stable for the past 5 months. The patient recently fractured her left hip and underwent repair of this on 09/05/2015 by Dr. Sharol Given. She is currently in a skilled nursing facility. She also had abnormal CBC lab results including elevated platelet count and white blood cell count in her labs recently. She has been referred to hematology regarding this. She is currently ambulating minimally. She is on Eliquis for atrial fibrillation.  Past Medical History  Diagnosis Date  . Depression   . Essential hypertension, benign   . Hypercholesteremia   . Insulin dependent diabetes mellitus (Theresa Bird)   . Essential thrombocytosis (HCC)     JAK2 negative  . Coronary atherosclerosis of native coronary artery     Multivessel status post CABG in [place]  . Atrial fibrillation (Theresa Bird)   . Myelofibrosis (Theresa Bird)   . PAD (peripheral artery disease) (Theresa Bird)   . Myocardial infarction (Theresa Bird)   . Dysrhythmia     A FIB  . Seizure (Theresa Bird)     NONE IN 15 YEARS  . COPD (chronic obstructive pulmonary disease) (HCC)     USES 2L O2  HS  . Shortness of breath dyspnea     W/ EXERTION   . Tuberculosis     PARENTS BOTH HAD TB   . History of kidney stones     Family History  Problem Relation Age of Onset  . Coronary artery disease Mother     SOCIAL HISTORY: Social History  Substance Use Topics  . Smoking status: Never Smoker   . Smokeless tobacco: Never Used  . Alcohol Use: 0.0 oz/week    0 Standard drinks or equivalent per week     Comment: Socially    No Known Allergies  Current Outpatient Prescriptions  Medication Sig Dispense Refill  . acetaminophen  (TYLENOL) 500 MG tablet Take 1 tablet (500 mg total) by mouth every 6 (six) hours as needed for mild pain. 30 tablet 0  . albuterol (PROAIR HFA) 108 (90 BASE) MCG/ACT inhaler Inhale 2 puffs into the lungs every 6 (six) hours as needed for wheezing or shortness of breath.    Marland Kitchen alendronate (FOSAMAX) 35 MG tablet Take 1 tablet (35 mg total) by mouth every 7 (seven) days. Take with a full glass of water on an empty stomach. 4 tablet 0  . apixaban (ELIQUIS) 2.5 MG TABS tablet Take 1 tablet (2.5 mg total) by mouth 2 (two) times daily. 60 tablet 0  . aspirin EC 81 MG tablet Take 81 mg by mouth daily.    Marland Kitchen atorvastatin (LIPITOR) 40 MG tablet Take 40 mg by mouth every evening.    . calcium carbonate (OS-CAL) 600 MG tablet Take 1 tablet (600 mg total) by mouth 2 (two) times daily with a meal. 60 tablet 0  . cholecalciferol (VITAMIN D) 1000 units tablet Take 1 tablet (1,000 Units total) by mouth daily. 30 tablet 0  . diltiazem (CARTIA XT) 180 MG 24 hr capsule Take 1 capsule (180 mg total) by mouth daily. 30 capsule 6  . fentaNYL (DURAGESIC - DOSED MCG/HR) 25 MCG/HR patch Place 25 mcg onto the skin every 3 (three) days.    . furosemide (  LASIX) 20 MG tablet Take 20 mg by mouth daily.     . hydroxyurea (HYDREA) 500 MG capsule Take 500 mg by mouth 2 (two) times daily.     . insulin aspart protamine - aspart (NOVOLOG MIX 70/30 FLEXPEN) (70-30) 100 UNIT/ML FlexPen Inject 15 Units into the skin 2 (two) times daily.    . Melatonin-Pyridoxine (MELATIN PO) Take 3 mg by mouth at bedtime.    . metoprolol succinate (TOPROL-XL) 25 MG 24 hr tablet Take 25 mg by mouth daily.    Marland Kitchen oxyCODONE-acetaminophen (PERCOCET/ROXICET) 5-325 MG tablet Take 1 tablet by mouth every 4 (four) hours as needed for severe pain. 30 tablet 0  . OXYGEN Inhale 2 L into the lungs.    . sertraline (ZOLOFT) 50 MG tablet Take 50 mg by mouth daily.    . traMADol (ULTRAM) 50 MG tablet Take 50 mg by mouth every 6 (six) hours as needed for moderate pain.      . vitamin C (ASCORBIC ACID) 500 MG tablet Take 500 mg by mouth 2 (two) times daily.     No current facility-administered medications for this visit.    REVIEW OF SYSTEMS:  [X]  denotes positive finding, [ ]  denotes negative finding Cardiac  Comments:  Chest pain or chest pressure:    Shortness of breath upon exertion:    Short of breath when lying flat:    Irregular heart rhythm:        Vascular    Pain in calf, thigh, or hip brought on by ambulation:    Pain in feet at night that wakes you up from your sleep:     Blood clot in your veins:    Leg swelling:         Pulmonary    Oxygen at home:    Productive cough:     Wheezing:         Neurologic    Sudden weakness in arms or legs:     Sudden numbness in arms or legs:     Sudden onset of difficulty speaking or slurred speech:    Temporary loss of vision in one eye:     Problems with dizziness:         Gastrointestinal    Blood in stool:     Vomited blood:         Genitourinary    Burning when urinating:     Blood in urine:        Psychiatric    Major depression:         Hematologic    Bleeding problems:    Problems with blood clotting too easily:        Skin    Rashes or ulcers: x       Constitutional    Fever or chills:      PHYSICAL EXAM: Filed Vitals:   09/21/15 1548  BP: 99/57  Pulse: 100  Temp: 97.1 F (36.2 C)  TempSrc: Oral  Resp: 14  Height: 5\' 8"  (1.727 m)  Weight: 148 lb (67.132 kg)  SpO2: 93%    GENERAL: The patient is an elderly  female, in no acute distress. The vital signs are documented above. VASCULAR: Right foot ulcers clean.  PULMONARY: Nonlabored respiratory effort. MUSCULOSKELETAL: There are no major deformities or cyanosis. NEUROLOGIC: No focal weakness or paresthesias are detected. SKIN: There are no ulcers or rashes noted. PSYCHIATRIC: The patient has a normal affect.   MEDICAL ISSUES:  Right foot ulceration  The  patient was previously scheduled for right  femoral-popliteal bypass on 09/25/2015. The patient recently fractured her left hip and underwent surgery at this a few weeks ago. She also had abnormal CBC lab results. She has seen hematology for this and will follow-up again soon. We do not have access to these records at this time. Given her recent injury and her stable right foot ulcerations, will postpone her revascularization for 4 weeks. She is tentatively scheduled for right femoral-popliteal bypass on 10/16/2015. She'll need to be off of her Eliquis for 72 hours prior to this.  Virgina Jock, PA-C [facility] [place]     I agree with the above.  I have seen and evaluated the patient.  She has a chronic ulcer on the right foot which is been there for approximately 8-9 months.  She underwent angiography which showed an occluded above-knee popliteal artery with a diseased single-vessel runoff via the anterior tibial artery.  I did not think she was a good candidate for endovascular repair given her diseased single-vessel runoff and chronic total occlusion.  Therefore she was scheduled to undergo a right femoral to below knee popliteal artery bypass.  Unfortunately, the patient fell and broke her hip.  This was treated by Dr. Sharol Given several weeks ago.  She is trying to recover from that.  I brought her in today to evaluate her mobility.  Currently, she can only walk 2 or 3 steps.  In addition the patient's preoperative lab work showed an elevated white count and platelet count.  She is followed by hematology.  I need to get their note for clearance for surgery.  I think overall, the patient is too debilitated to proceed with right femoral-popliteal bypass graft.  I'm going to delay her operation at least 6 weeks.  She will keep me updated on the progress of her ulcer and if it deteriorates we may need to schedule this sooner.  She does take Eliquis which will need to be stopped before surgery  Annamarie Major

## 2015-09-22 ENCOUNTER — Other Ambulatory Visit: Payer: Self-pay

## 2015-10-13 ENCOUNTER — Encounter (HOSPITAL_COMMUNITY)
Admission: RE | Admit: 2015-10-13 | Discharge: 2015-10-13 | Disposition: A | Discharge status: Home / Self Care | Payer: Medicare Other | Source: Ambulatory Visit | Attending: Surgery | Admitting: Surgery

## 2015-10-13 ENCOUNTER — Encounter (HOSPITAL_COMMUNITY): Payer: Self-pay

## 2015-10-13 LAB — TYPE AND SCREEN
ABO/RH(D): A POS
ANTIBODY SCREEN: NEGATIVE

## 2015-10-13 LAB — URINE MICROSCOPIC-ADD ON

## 2015-10-13 LAB — URINALYSIS, ROUTINE W REFLEX MICROSCOPIC
BILIRUBIN URINE: NEGATIVE
Glucose, UA: NEGATIVE mg/dL
KETONES UR: 15 mg/dL — AB
NITRITE: POSITIVE — AB
Protein, ur: >300 mg/dL — AB
SPECIFIC GRAVITY, URINE: 1.02 (ref 1.005–1.030)
pH: 6.5 (ref 5.0–8.0)

## 2015-10-13 LAB — COMPREHENSIVE METABOLIC PANEL
ALBUMIN: 3.5 g/dL (ref 3.5–5.0)
ALT: 15 U/L (ref 14–54)
ANION GAP: 12 (ref 5–15)
AST: 24 U/L (ref 15–41)
Alkaline Phosphatase: 90 U/L (ref 38–126)
BILIRUBIN TOTAL: 0.7 mg/dL (ref 0.3–1.2)
BUN: 11 mg/dL (ref 6–20)
CHLORIDE: 98 mmol/L — AB (ref 101–111)
CO2: 28 mmol/L (ref 22–32)
Calcium: 9.2 mg/dL (ref 8.9–10.3)
Creatinine, Ser: 0.54 mg/dL (ref 0.44–1.00)
GFR calc Af Amer: >60 mL/min (ref >60)
GFR calc non Af Amer: >60 mL/min (ref >60)
GLUCOSE: 140 mg/dL — AB (ref 65–99)
POTASSIUM: 4.3 mmol/L (ref 3.5–5.1)
SODIUM: 138 mmol/L (ref 135–145)
Total Protein: 6.4 g/dL — ABNORMAL LOW (ref 6.5–8.1)

## 2015-10-13 LAB — SURGICAL PCR SCREEN
MRSA, PCR: NEGATIVE
STAPHYLOCOCCUS AUREUS: NEGATIVE

## 2015-10-13 LAB — APTT: APTT: 39 s — AB (ref 24–37)

## 2015-10-13 LAB — PROTIME-INR
INR: 1.51 — ABNORMAL HIGH (ref 0.00–1.49)
Prothrombin Time: 18.3 seconds — ABNORMAL HIGH (ref 11.6–15.2)

## 2015-10-13 LAB — CBC
HEMATOCRIT: 41.1 % (ref 36.0–46.0)
Hemoglobin: 13.4 g/dL (ref 12.0–15.0)
MCH: 39 pg — ABNORMAL HIGH (ref 26.0–34.0)
MCHC: 32.6 g/dL (ref 30.0–36.0)
MCV: 119.5 fL — ABNORMAL HIGH (ref 78.0–100.0)
PLATELETS: 718 10*3/uL — AB (ref 150–400)
RBC: 3.44 MIL/uL — ABNORMAL LOW (ref 3.87–5.11)
RDW: 17.7 % — ABNORMAL HIGH (ref 11.5–15.5)
WBC: 16.6 10*3/uL — AB (ref 4.0–10.5)

## 2015-10-13 LAB — GLUCOSE, CAPILLARY: GLUCOSE-CAPILLARY: 144 mg/dL — AB (ref 65–99)

## 2015-10-13 NOTE — Progress Notes (Signed)
Pt currently resides at [facility]. Pt accompanied by daughter, Mariann Laster, to PAT appointment. Pt denies SOB and chest pain but is under the care of Dr. Domenic Polite, Cardiology. Pt denies having a cardiac cath. Arbie Cookey, RN, at [employer] office, stated that pt last dose of Eliquis should have been 10/12/15; pt stated that last dose was then. Pt chart forwarded to anesthesia to review clearance note from Dr. Audree Camel, Oncology.

## 2015-10-13 NOTE — Pre-Procedure Instructions (Signed)
Theresa Bird  10/13/2015      [facility] [address] [place] - [address] [place] [address] Phone: [phone] Fax: [phone]    Your procedure is scheduled on Friday, Oct 16, 2015  Report to [facility] Admitting at 5:30 A.M.  Call this number if you have problems the morning of surgery:  [phone]   Remember:  Do not eat food or drink liquids after midnight Thursday, Oct 15, 2015  Take these medicines the morning of surgery with A SIP OF WATER : Aspirin, diltiazem (CARTIA XT),  hydroxyurea (HYDREA), metoprolol succinate (TOPROL-XL), sertraline (ZOLOFT), if needed: pain medication, albuterol (PROAIR HFA)  Inhaler for wheezing or shortness of breath ( Bring inhaler in with you on day of surgery). Stop taking vitamins, fish oil,  apixaban (ELIQUIS) and herbal medications such as Melatonin-Pyridoxine (MELATIN . Do not take any NSAIDs ie: Ibuprofen, Advil, Naproxen, BC and Goody Powder.   How to Manage Your Diabetes Before and After Surgery  Why is it important to control my blood sugar before and after surgery? . Improving blood sugar levels before and after surgery helps healing and can limit problems. . A way of improving blood sugar control is eating a healthy diet by: o  Eating less sugar and carbohydrates o  Increasing activity/exercise o  Talking with your doctor about reaching your blood sugar goals . High blood sugars (greater than 180 mg/dL) can raise your risk of infections and slow your recovery, so you will need to focus on controlling your diabetes during the weeks before surgery. . Make sure that the doctor who takes care of your diabetes knows about your planned surgery including the date and location.  How do I manage my blood sugar before surgery? . Check your blood sugar at least 4 times a day, starting 2 days before surgery, to make sure that the level is not too high or low. o Check your blood sugar the morning of  your surgery when you wake up and every 2 hours until you get to the Short Stay unit. . If your blood sugar is less than 70 mg/dL, you will need to treat for low blood sugar: o Do not take insulin. o Treat a low blood sugar (less than 70 mg/dL) with  cup of clear juice (cranberry or apple), 4 glucose tablets, OR glucose gel. o Recheck blood sugar in 15 minutes after treatment (to make sure it is greater than 70 mg/dL). If your blood sugar is not greater than 70 mg/dL on recheck, call [phone] for further instructions. . Report your blood sugar to the short stay nurse when you get to Short Stay.  . If you are admitted to the hospital after surgery: o Your blood sugar will be checked by the staff and you will probably be given insulin after surgery (instead of oral diabetes medicines) to make sure you have good blood sugar levels. o The goal for blood sugar control after surgery is 80-180 mg/dL.  WHAT DO I DO ABOUT MY DIABETES MEDICATION?   Marland Kitchen Do not take oral diabetes medicines (pills) the morning of surgery.  . THE NIGHT BEFORE SURGERY, take 10 units of  NOVOLOG MIX 70/30 insulin.       Marland Kitchen HE MORNING OF SURGERY, take  NO insulin.  . The day of surgery, do not take other diabetes injectables, including Byetta (exenatide), Bydureon (exenatide ER), Victoza (liraglutide), or Trulicity (dulaglutide).  . If  your CBG is greater than 220 mg/dL, you may take  of your sliding scale (correction) dose of insulin.  Patient Signature:  Date:   Nurse Signature:  Date:   Reviewed and Endorsed by [facility] Patient Education Committee, August 2015  Do not wear jewelry, make-up or nail polish.  Do not wear lotions, powders, or perfumes.  You may not wear deodorant.  Do not shave 48 hours prior to surgery.    Do not bring valuables to the hospital.  [facility] is not responsible for any belongings or valuables.  Contacts, dentures or bridgework may not be worn into surgery.  Leave your  suitcase in the car.  After surgery it may be brought to your room.  For patients admitted to the hospital, discharge time will be determined by your treatment team.  Patients discharged the day of surgery will not be allowed to drive home.   Name and phone number of your driver:   Special instructions:   Please read over the following fact sheets that you were given. Pain Booklet, Coughing and Deep Breathing, Blood Transfusion Information, MRSA Information and Surgical Site Infection Prevention

## 2015-10-14 ENCOUNTER — Telehealth: Payer: Self-pay

## 2015-10-14 DIAGNOSIS — R319 Hematuria, unspecified: Principal | ICD-10-CM

## 2015-10-14 DIAGNOSIS — N390 Urinary tract infection, site not specified: Secondary | ICD-10-CM

## 2015-10-14 MED ORDER — CIPROFLOXACIN HCL 500 MG PO TABS: 500.0000 mg | ORAL_TABLET | Freq: Two times a day (BID) | ORAL | Status: DC

## 2015-10-14 NOTE — Progress Notes (Signed)
LVM with Arbie Cookey, RN at surgeon's office to make aware of abnormal labs.

## 2015-10-14 NOTE — Progress Notes (Signed)
Anesthesia Chart Review:  Pt is an 82 year old female scheduled for R fem-below knee popliteal bypass graft on 10/16/2015 with Dr. Trula Slade.   Pt was originally scheduled for surgery 09/25/15 but it was delayed due to hip fracture and abnormal CBC labs.    Hematologist is Dr. Audree Camel (care everywhere) who has cleared pt for surgery. PCP is Dr. Clide Deutscher. Cardiologist is Dr. Rozann Lesches who cleared pt for surgery at intermediate risk.   PMH includes: CAD (s/p CABG 2002), atrial fibrillation, HTN, DM, hyperlipidemia, PAD, COPD, seizure (01/2015), essential thrombocytosis. never smoker. BMI 23. S/p IM nail L hip 09/05/15.   Medications include: albuterol eliquis, ASA, lipitor, diltiazem, lasix, fentanyl patch, hydroxyurea, novolog, metoprolol. Pt to stop eliquis 72 hours prior to surgery.   Preoperative labs reviewed.  - PTT 39, PT 18.3. Will recheck DOS.  -WBC 16.6. Baseline WBC count not entirely clear; seems to range from normal to 15 based on prior labs; was 15.3 at last office visit with hem-onc 09/24/15.  - Platelets 718. Pt has essential thrombocytosis and is followed by hematology, takes hydrea daily.   Chest x-ray 09/07/15 reviewed. No acute disease  EKG 09/06/15: NSR. Low voltage QRS. Nonspecific ST abnormality. Abnormal QRS-T angle, consider primary T wave abnormality  Echo 02/27/14:  - LV normal in size, mild LVH. Global LV wall motion and contractility WNL. EF 60-65%. Abnormal LV diastolic function.  - LA mildly dilated - aortic valve structure is normal. Mild aortic leaflet calcification. Moderate aortic cusp sclerosis. No regurgitation - Mild MR. Mitral annular calcification.   Nuclear stress test 11/21/11:  1. No definite evidence for myocardial reversibility. Basilar reversibility is nonspecific but likely due to non-uniform soft tissue attenuation artifact 2. Normal LV systolic function. EF 54%  If no changes, I anticipate pt can proceed with surgery as  scheduled.   Willeen Cass, FNP-BC [facility]/Anesthesiology Phone: [phone] 10/14/2015 11:14 AM

## 2015-10-14 NOTE — Telephone Encounter (Signed)
Rec'd voice message from Pre-op nurse with request to review the abnormal pre-op labs.  WBC 16.3, Platelets 718, PT/INR 1.5, PTT 39, abnormal UA shows "Turbid urine,  positive Nitrites and small amt. Leukocytes."  Will start pt. on Cipro 500 mg 1 tab, po, BID; # 14; no refills, per office standing orders.  Notified pt's daughter, Mariann Laster of abnormal labs.  Notified I[place], nurse at the [facility].  Will fax new order for antibiotic to be started today.  Will make Dr. Trula Slade aware of abnormal labs.

## 2015-10-15 ENCOUNTER — Other Ambulatory Visit: Payer: Self-pay

## 2015-10-15 NOTE — Anesthesia Preprocedure Evaluation (Addendum)
Anesthesia Evaluation  Patient identified by MRN, date of birth, ID band Patient awake    Reviewed: Allergy & Precautions, NPO status , Patient's Chart, lab work & pertinent test results, reviewed documented beta blocker date and time   Airway Mallampati: II   Neck ROM: Full    Dental  (+) Missing, Dental Advisory Given, Poor Dentition   Pulmonary neg pulmonary ROS,    Pulmonary exam normal        Cardiovascular hypertension, Pt. on medications + CAD, + Past MI and + Peripheral Vascular Disease  Normal cardiovascular exam+ dysrhythmias Atrial Fibrillation   2015 ECHO 55%   Neuro/Psych negative neurological ROS     GI/Hepatic negative GI ROS, Neg liver ROS,   Endo/Other  diabetes, Type 2, Insulin Dependent  Renal/GU negative Renal ROS     Musculoskeletal   Abdominal (+)  Abdomen: soft.    Peds  Hematology   Anesthesia Other Findings   Reproductive/Obstetrics                            Anesthesia Physical  Anesthesia Plan  ASA: III  Anesthesia Plan: General   Post-op Pain Management:    Induction: Intravenous  Airway Management Planned: Oral ETT  Additional Equipment:   Intra-op Plan:   Post-operative Plan: Extubation in OR  Informed Consent: I have reviewed the patients History and Physical, chart, labs and discussed the procedure including the risks, benefits and alternatives for the proposed anesthesia with the patient or authorized representative who has indicated his/her understanding and acceptance.   Dental advisory given  Plan Discussed with: CRNA, Anesthesiologist and Surgeon  Anesthesia Plan Comments:        Anesthesia Quick Evaluation

## 2015-10-15 NOTE — Telephone Encounter (Signed)
Discussed abnormal lab work with Dr. Trula Slade.  Advised to plan to proceed with surgery 5/12; repeat lab work AM of surgery; make certain she started the Cipro on 5/10; and resched. her start time to 2nd case to allow time to repeat lab work.  Will notify the nursing home of time change, and to check on status of taking antibiotic.

## 2015-10-16 ENCOUNTER — Inpatient Hospital Stay (HOSPITAL_COMMUNITY)
Admission: RE | Admit: 2015-10-16 | Discharge: 2015-10-18 | DRG: 253 | Disposition: A | Discharge status: Skilled Nursing Facility | Payer: Medicare Other | Source: Ambulatory Visit | Attending: Surgery | Admitting: Surgery

## 2015-10-16 ENCOUNTER — Encounter (HOSPITAL_COMMUNITY): Payer: Self-pay | Admitting: *Deleted

## 2015-10-16 ENCOUNTER — Encounter (HOSPITAL_COMMUNITY)
Admission: RE | Disposition: A | Discharge status: Skilled Nursing Facility | Payer: Self-pay | Source: Ambulatory Visit | Attending: Surgery

## 2015-10-16 ENCOUNTER — Inpatient Hospital Stay (HOSPITAL_COMMUNITY): Payer: Medicare Other | Admitting: Certified Registered"

## 2015-10-16 DIAGNOSIS — I4819 Other persistent atrial fibrillation: Secondary | ICD-10-CM | POA: Insufficient documentation

## 2015-10-16 DIAGNOSIS — E1151 Type 2 diabetes mellitus with diabetic peripheral angiopathy without gangrene: Principal | ICD-10-CM | POA: Diagnosis present

## 2015-10-16 DIAGNOSIS — J449 Chronic obstructive pulmonary disease, unspecified: Secondary | ICD-10-CM | POA: Diagnosis present

## 2015-10-16 DIAGNOSIS — Z951 Presence of aortocoronary bypass graft: Secondary | ICD-10-CM

## 2015-10-16 DIAGNOSIS — Z9981 Dependence on supplemental oxygen: Secondary | ICD-10-CM | POA: Diagnosis not present

## 2015-10-16 DIAGNOSIS — I484 Atypical atrial flutter: Secondary | ICD-10-CM | POA: Diagnosis not present

## 2015-10-16 DIAGNOSIS — D62 Acute posthemorrhagic anemia: Secondary | ICD-10-CM | POA: Diagnosis not present

## 2015-10-16 DIAGNOSIS — I2510 Atherosclerotic heart disease of native coronary artery without angina pectoris: Secondary | ICD-10-CM | POA: Diagnosis present

## 2015-10-16 DIAGNOSIS — I481 Persistent atrial fibrillation: Secondary | ICD-10-CM

## 2015-10-16 DIAGNOSIS — Z7982 Long term (current) use of aspirin: Secondary | ICD-10-CM

## 2015-10-16 DIAGNOSIS — D7581 Myelofibrosis: Secondary | ICD-10-CM | POA: Diagnosis present

## 2015-10-16 DIAGNOSIS — Z7901 Long term (current) use of anticoagulants: Secondary | ICD-10-CM | POA: Diagnosis not present

## 2015-10-16 DIAGNOSIS — L89159 Pressure ulcer of sacral region, unspecified stage: Secondary | ICD-10-CM | POA: Diagnosis present

## 2015-10-16 DIAGNOSIS — I10 Essential (primary) hypertension: Secondary | ICD-10-CM | POA: Diagnosis present

## 2015-10-16 DIAGNOSIS — E7800 Pure hypercholesterolemia, unspecified: Secondary | ICD-10-CM | POA: Diagnosis present

## 2015-10-16 DIAGNOSIS — E11621 Type 2 diabetes mellitus with foot ulcer: Secondary | ICD-10-CM | POA: Diagnosis present

## 2015-10-16 DIAGNOSIS — F329 Major depressive disorder, single episode, unspecified: Secondary | ICD-10-CM | POA: Diagnosis present

## 2015-10-16 DIAGNOSIS — Z794 Long term (current) use of insulin: Secondary | ICD-10-CM | POA: Diagnosis not present

## 2015-10-16 DIAGNOSIS — Z201 Contact with and (suspected) exposure to tuberculosis: Secondary | ICD-10-CM | POA: Diagnosis present

## 2015-10-16 DIAGNOSIS — I4892 Unspecified atrial flutter: Secondary | ICD-10-CM | POA: Diagnosis not present

## 2015-10-16 DIAGNOSIS — Z79899 Other long term (current) drug therapy: Secondary | ICD-10-CM

## 2015-10-16 DIAGNOSIS — I739 Peripheral vascular disease, unspecified: Secondary | ICD-10-CM

## 2015-10-16 DIAGNOSIS — I482 Chronic atrial fibrillation: Secondary | ICD-10-CM | POA: Diagnosis present

## 2015-10-16 DIAGNOSIS — I252 Old myocardial infarction: Secondary | ICD-10-CM | POA: Diagnosis not present

## 2015-10-16 DIAGNOSIS — N390 Urinary tract infection, site not specified: Secondary | ICD-10-CM | POA: Diagnosis present

## 2015-10-16 DIAGNOSIS — R319 Hematuria, unspecified: Secondary | ICD-10-CM

## 2015-10-16 DIAGNOSIS — L97519 Non-pressure chronic ulcer of other part of right foot with unspecified severity: Secondary | ICD-10-CM | POA: Diagnosis present

## 2015-10-16 DIAGNOSIS — I70234 Atherosclerosis of native arteries of right leg with ulceration of heel and midfoot: Secondary | ICD-10-CM | POA: Diagnosis not present

## 2015-10-16 HISTORY — PX: FEMORAL-POPLITEAL BYPASS GRAFT: SHX937

## 2015-10-16 LAB — APTT: aPTT: 35 seconds (ref 24–37)

## 2015-10-16 LAB — GLUCOSE, CAPILLARY
GLUCOSE-CAPILLARY: 170 mg/dL — AB (ref 65–99)
GLUCOSE-CAPILLARY: 204 mg/dL — AB (ref 65–99)
Glucose-Capillary: 153 mg/dL — ABNORMAL HIGH (ref 65–99)
Glucose-Capillary: 140 mg/dL — ABNORMAL HIGH (ref 65–99)

## 2015-10-16 LAB — CBC
HCT: 38.6 % (ref 36.0–46.0)
Hemoglobin: 12.7 g/dL (ref 12.0–15.0)
MCH: 38.7 pg — AB (ref 26.0–34.0)
MCHC: 32.9 g/dL (ref 30.0–36.0)
MCV: 117.7 fL — AB (ref 78.0–100.0)
PLATELETS: 694 10*3/uL — AB (ref 150–400)
RBC: 3.28 MIL/uL — AB (ref 3.87–5.11)
RDW: 17.5 % — AB (ref 11.5–15.5)
WBC: 16.9 10*3/uL — ABNORMAL HIGH (ref 4.0–10.5)

## 2015-10-16 LAB — PROTIME-INR
INR: 1.44 (ref 0.00–1.49)
PROTHROMBIN TIME: 17.6 s — AB (ref 11.6–15.2)

## 2015-10-16 SURGERY — BYPASS GRAFT FEMORAL-POPLITEAL ARTERY
Anesthesia: General | Laterality: Right

## 2015-10-16 MED ORDER — FENTANYL 25 MCG/HR TD PT72
25.0000 ug | MEDICATED_PATCH | TRANSDERMAL | Status: DC
  Administered 2015-10-16: 25 ug via TRANSDERMAL
  Filled 2015-10-16: qty 1

## 2015-10-16 MED ORDER — CHLORHEXIDINE GLUCONATE CLOTH 2 % EX PADS: 6.0000 | MEDICATED_PAD | Freq: Once | CUTANEOUS | Status: DC

## 2015-10-16 MED ORDER — PANTOPRAZOLE SODIUM 40 MG PO TBEC
40.0000 mg | DELAYED_RELEASE_TABLET | Freq: Every day | ORAL | Status: DC
  Administered 2015-10-16 – 2015-10-18 (×3): 40 mg via ORAL
  Filled 2015-10-16 (×3): qty 1

## 2015-10-16 MED ORDER — FENTANYL CITRATE (PF) 250 MCG/5ML IJ SOLN
INTRAMUSCULAR | Status: AC
  Filled 2015-10-16: qty 5

## 2015-10-16 MED ORDER — VITAMIN D3 25 MCG (1000 UNIT) PO TABS
1000.0000 [IU] | ORAL_TABLET | Freq: Every day | ORAL | Status: DC
  Administered 2015-10-17 – 2015-10-18 (×2): 1000 [IU] via ORAL
  Filled 2015-10-16 (×4): qty 1

## 2015-10-16 MED ORDER — INSULIN ASPART 100 UNIT/ML ~~LOC~~ SOLN
0.0000 [IU] | Freq: Three times a day (TID) | SUBCUTANEOUS | Status: DC
  Administered 2015-10-16 – 2015-10-17 (×2): 1 [IU] via SUBCUTANEOUS
  Administered 2015-10-17: 3 [IU] via SUBCUTANEOUS
  Administered 2015-10-17 – 2015-10-18 (×2): 5 [IU] via SUBCUTANEOUS
  Administered 2015-10-18: 2 [IU] via SUBCUTANEOUS

## 2015-10-16 MED ORDER — SUGAMMADEX SODIUM 200 MG/2ML IV SOLN
INTRAVENOUS | Status: DC | PRN
  Administered 2015-10-16: 150 mg via INTRAVENOUS

## 2015-10-16 MED ORDER — DEXTROSE 5 % IV SOLN
1.5000 g | Freq: Two times a day (BID) | INTRAVENOUS | Status: AC
  Administered 2015-10-16 – 2015-10-17 (×2): 1.5 g via INTRAVENOUS
  Filled 2015-10-16 (×2): qty 1.5

## 2015-10-16 MED ORDER — ONDANSETRON HCL 4 MG/2ML IJ SOLN: 4.0000 mg | Freq: Four times a day (QID) | INTRAMUSCULAR | Status: DC | PRN

## 2015-10-16 MED ORDER — TRAMADOL HCL 50 MG PO TABS
ORAL_TABLET | ORAL | Status: AC
  Filled 2015-10-16: qty 1

## 2015-10-16 MED ORDER — MORPHINE SULFATE (PF) 4 MG/ML IV SOLN
INTRAVENOUS | Status: AC
  Filled 2015-10-16: qty 1

## 2015-10-16 MED ORDER — ZINC SULFATE 220 (50 ZN) MG PO CAPS
220.0000 mg | ORAL_CAPSULE | Freq: Every day | ORAL | Status: DC
  Administered 2015-10-17 – 2015-10-18 (×2): 220 mg via ORAL
  Filled 2015-10-16 (×2): qty 1

## 2015-10-16 MED ORDER — LACTATED RINGERS IV SOLN
INTRAVENOUS | Status: DC | PRN
  Administered 2015-10-16 (×2): via INTRAVENOUS

## 2015-10-16 MED ORDER — ROCURONIUM BROMIDE 100 MG/10ML IV SOLN
INTRAVENOUS | Status: DC | PRN
Start: 2015-10-16 — End: 2015-10-16
  Administered 2015-10-16: 20 mg via INTRAVENOUS
  Administered 2015-10-16: 50 mg via INTRAVENOUS
  Administered 2015-10-16 (×3): 10 mg via INTRAVENOUS

## 2015-10-16 MED ORDER — ESMOLOL HCL 100 MG/10ML IV SOLN
INTRAVENOUS | Status: AC
  Filled 2015-10-16: qty 10

## 2015-10-16 MED ORDER — DILTIAZEM HCL 100 MG IV SOLR
5.0000 mg/h | INTRAVENOUS | Status: DC
  Filled 2015-10-16 (×2): qty 100

## 2015-10-16 MED ORDER — LIDOCAINE HCL (CARDIAC) 20 MG/ML IV SOLN
INTRAVENOUS | Status: DC | PRN
  Administered 2015-10-16: 100 mg via INTRAVENOUS

## 2015-10-16 MED ORDER — PROTAMINE SULFATE 10 MG/ML IV SOLN
INTRAVENOUS | Status: DC | PRN
  Administered 2015-10-16: 50 mg via INTRAVENOUS

## 2015-10-16 MED ORDER — ATORVASTATIN CALCIUM 40 MG PO TABS
40.0000 mg | ORAL_TABLET | Freq: Every evening | ORAL | Status: DC
  Administered 2015-10-16 – 2015-10-17 (×2): 40 mg via ORAL
  Filled 2015-10-16 (×2): qty 1

## 2015-10-16 MED ORDER — DIPHENHYDRAMINE HCL 50 MG/ML IJ SOLN
INTRAMUSCULAR | Status: DC | PRN
  Administered 2015-10-16: 12.5 mg via INTRAVENOUS

## 2015-10-16 MED ORDER — SODIUM CHLORIDE 0.9 % IV SOLN
INTRAVENOUS | Status: DC | PRN
  Administered 2015-10-16: 09:00:00

## 2015-10-16 MED ORDER — PRO-STAT SUGAR FREE PO LIQD
60.0000 mL | Freq: Three times a day (TID) | ORAL | Status: DC
  Administered 2015-10-16 – 2015-10-18 (×6): 60 mL via ORAL
  Filled 2015-10-16 (×6): qty 60

## 2015-10-16 MED ORDER — TRAMADOL HCL 50 MG PO TABS
50.0000 mg | ORAL_TABLET | Freq: Four times a day (QID) | ORAL | Status: DC | PRN
Start: 2015-10-16 — End: 2015-10-18
  Administered 2015-10-16 – 2015-10-18 (×3): 50 mg via ORAL
  Filled 2015-10-16 (×3): qty 1

## 2015-10-16 MED ORDER — DILTIAZEM HCL ER COATED BEADS 180 MG PO CP24
180.0000 mg | ORAL_CAPSULE | Freq: Every day | ORAL | Status: DC
  Administered 2015-10-17 – 2015-10-18 (×2): 180 mg via ORAL
  Filled 2015-10-16 (×2): qty 1

## 2015-10-16 MED ORDER — PHENYLEPHRINE HCL 10 MG/ML IJ SOLN
INTRAMUSCULAR | Status: DC | PRN
  Administered 2015-10-16: 120 ug via INTRAVENOUS
  Administered 2015-10-16: 40 ug via INTRAVENOUS
  Administered 2015-10-16 (×2): 120 ug via INTRAVENOUS
  Administered 2015-10-16 (×2): 160 ug via INTRAVENOUS
  Administered 2015-10-16: 80 ug via INTRAVENOUS

## 2015-10-16 MED ORDER — SODIUM CHLORIDE 0.9 % IV SOLN: INTRAVENOUS | Status: DC

## 2015-10-16 MED ORDER — MORPHINE SULFATE (PF) 2 MG/ML IV SOLN
1.0000 mg | INTRAVENOUS | Status: DC | PRN
  Administered 2015-10-16 – 2015-10-17 (×3): 2 mg via INTRAVENOUS
  Filled 2015-10-16 (×3): qty 1

## 2015-10-16 MED ORDER — ONDANSETRON HCL 4 MG/2ML IJ SOLN
INTRAMUSCULAR | Status: DC | PRN
  Administered 2015-10-16: 4 mg via INTRAVENOUS

## 2015-10-16 MED ORDER — HEPARIN SODIUM (PORCINE) 1000 UNIT/ML IJ SOLN
INTRAMUSCULAR | Status: DC | PRN
  Administered 2015-10-16: 6000 [IU] via INTRAVENOUS
  Administered 2015-10-16: 2000 [IU] via INTRAVENOUS

## 2015-10-16 MED ORDER — 0.9 % SODIUM CHLORIDE (POUR BTL) OPTIME
TOPICAL | Status: DC | PRN
  Administered 2015-10-16 (×2): 1000 mL

## 2015-10-16 MED ORDER — VITAMIN C 500 MG PO TABS
500.0000 mg | ORAL_TABLET | Freq: Two times a day (BID) | ORAL | Status: DC
  Administered 2015-10-17 – 2015-10-18 (×3): 500 mg via ORAL
  Filled 2015-10-16 (×3): qty 1

## 2015-10-16 MED ORDER — CEFUROXIME SODIUM 1.5 G IJ SOLR
1.5000 g | INTRAMUSCULAR | Status: AC
  Administered 2015-10-16: 1.5 g via INTRAVENOUS
  Filled 2015-10-16: qty 1.5

## 2015-10-16 MED ORDER — SODIUM CHLORIDE 0.9 % IV SOLN
500.0000 mL | Freq: Once | INTRAVENOUS | Status: DC | PRN
Start: 2015-10-16 — End: 2015-10-18

## 2015-10-16 MED ORDER — METOPROLOL SUCCINATE ER 25 MG PO TB24
25.0000 mg | ORAL_TABLET | ORAL | Status: DC
  Filled 2015-10-16: qty 1

## 2015-10-16 MED ORDER — DILTIAZEM HCL 100 MG IV SOLR
100.0000 mg | INTRAVENOUS | Status: DC | PRN
  Administered 2015-10-16: 5 mg/h via INTRAVENOUS

## 2015-10-16 MED ORDER — CEFUROXIME SODIUM 1.5 G IJ SOLR: 1.5000 g | INTRAMUSCULAR | Status: DC

## 2015-10-16 MED ORDER — ALBUTEROL SULFATE (2.5 MG/3ML) 0.083% IN NEBU: 2.5000 mg | INHALATION_SOLUTION | Freq: Four times a day (QID) | RESPIRATORY_TRACT | Status: DC | PRN

## 2015-10-16 MED ORDER — DILTIAZEM HCL 100 MG IV SOLR
5.0000 mg/h | INTRAVENOUS | Status: DC
  Administered 2015-10-16: 5 mg/h via INTRAVENOUS
  Filled 2015-10-16 (×2): qty 100

## 2015-10-16 MED ORDER — PROPOFOL 10 MG/ML IV BOLUS
INTRAVENOUS | Status: DC | PRN
  Administered 2015-10-16: 130 mg via INTRAVENOUS

## 2015-10-16 MED ORDER — SUGAMMADEX SODIUM 200 MG/2ML IV SOLN
INTRAVENOUS | Status: AC
  Filled 2015-10-16: qty 2

## 2015-10-16 MED ORDER — PROPOFOL 10 MG/ML IV BOLUS
INTRAVENOUS | Status: AC
  Filled 2015-10-16: qty 20

## 2015-10-16 MED ORDER — FENTANYL CITRATE (PF) 100 MCG/2ML IJ SOLN
INTRAMUSCULAR | Status: DC | PRN
  Administered 2015-10-16: 50 ug via INTRAVENOUS
  Administered 2015-10-16: 150 ug via INTRAVENOUS

## 2015-10-16 MED ORDER — ACETAMINOPHEN 500 MG PO TABS
500.0000 mg | ORAL_TABLET | Freq: Four times a day (QID) | ORAL | Status: DC | PRN
  Administered 2015-10-17 – 2015-10-18 (×2): 500 mg via ORAL
  Filled 2015-10-16 (×2): qty 1

## 2015-10-16 MED ORDER — POTASSIUM CHLORIDE CRYS ER 20 MEQ PO TBCR: 20.0000 meq | EXTENDED_RELEASE_TABLET | Freq: Every day | ORAL | Status: DC | PRN

## 2015-10-16 MED ORDER — PHENYLEPHRINE HCL 10 MG/ML IJ SOLN
10.0000 mg | INTRAVENOUS | Status: DC | PRN
  Administered 2015-10-16: 30 ug/min via INTRAVENOUS

## 2015-10-16 MED ORDER — PHENYLEPHRINE 40 MCG/ML (10ML) SYRINGE FOR IV PUSH (FOR BLOOD PRESSURE SUPPORT)
PREFILLED_SYRINGE | INTRAVENOUS | Status: AC
  Filled 2015-10-16: qty 10

## 2015-10-16 MED ORDER — HEMOSTATIC AGENTS (NO CHARGE) OPTIME
TOPICAL | Status: DC | PRN
  Administered 2015-10-16: 1 via TOPICAL

## 2015-10-16 MED ORDER — CALCIUM CARBONATE 1250 (500 CA) MG PO TABS
1250.0000 mg | ORAL_TABLET | Freq: Two times a day (BID) | ORAL | Status: DC
  Administered 2015-10-16 – 2015-10-18 (×4): 1250 mg via ORAL
  Filled 2015-10-16 (×4): qty 1

## 2015-10-16 MED ORDER — ONDANSETRON HCL 4 MG/2ML IJ SOLN
INTRAMUSCULAR | Status: AC
  Filled 2015-10-16: qty 2

## 2015-10-16 MED ORDER — CIPROFLOXACIN HCL 500 MG PO TABS
500.0000 mg | ORAL_TABLET | Freq: Two times a day (BID) | ORAL | Status: DC
  Administered 2015-10-16 – 2015-10-17 (×2): 500 mg via ORAL
  Filled 2015-10-16 (×3): qty 1

## 2015-10-16 MED ORDER — HYDRALAZINE HCL 20 MG/ML IJ SOLN: 5.0000 mg | INTRAMUSCULAR | Status: DC | PRN

## 2015-10-16 MED ORDER — IOPAMIDOL (ISOVUE-300) INJECTION 61%
INTRAVENOUS | Status: AC
  Filled 2015-10-16: qty 50

## 2015-10-16 MED ORDER — LABETALOL HCL 5 MG/ML IV SOLN: 10.0000 mg | INTRAVENOUS | Status: DC | PRN

## 2015-10-16 MED ORDER — DOCUSATE SODIUM 100 MG PO CAPS
100.0000 mg | ORAL_CAPSULE | Freq: Two times a day (BID) | ORAL | Status: DC
  Administered 2015-10-16 – 2015-10-18 (×4): 100 mg via ORAL
  Filled 2015-10-16 (×4): qty 1

## 2015-10-16 MED ORDER — HYDROXYUREA 500 MG PO CAPS
500.0000 mg | ORAL_CAPSULE | Freq: Two times a day (BID) | ORAL | Status: DC
  Administered 2015-10-16 – 2015-10-18 (×4): 500 mg via ORAL
  Filled 2015-10-16 (×4): qty 1

## 2015-10-16 MED ORDER — ESMOLOL HCL 100 MG/10ML IV SOLN
INTRAVENOUS | Status: DC | PRN
  Administered 2015-10-16: 30 mg via INTRAVENOUS
  Administered 2015-10-16: 40 mg via INTRAVENOUS

## 2015-10-16 MED ORDER — ADULT MULTIVITAMIN W/MINERALS CH
1.0000 | ORAL_TABLET | Freq: Every day | ORAL | Status: DC
  Administered 2015-10-17 – 2015-10-18 (×2): 1 via ORAL
  Filled 2015-10-16 (×3): qty 1

## 2015-10-16 MED ORDER — CHLORHEXIDINE GLUCONATE 4 % EX LIQD: 60.0000 mL | Freq: Once | CUTANEOUS | Status: DC

## 2015-10-16 MED ORDER — OXYCODONE-ACETAMINOPHEN 5-325 MG PO TABS
1.0000 | ORAL_TABLET | ORAL | Status: DC | PRN
  Administered 2015-10-16 – 2015-10-17 (×3): 1 via ORAL
  Filled 2015-10-16 (×3): qty 1

## 2015-10-16 MED ORDER — ASPIRIN EC 81 MG PO TBEC
81.0000 mg | DELAYED_RELEASE_TABLET | Freq: Every day | ORAL | Status: DC
  Administered 2015-10-17 – 2015-10-18 (×2): 81 mg via ORAL
  Filled 2015-10-16 (×2): qty 1

## 2015-10-16 MED ORDER — METOPROLOL SUCCINATE ER 25 MG PO TB24
25.0000 mg | ORAL_TABLET | Freq: Every day | ORAL | Status: DC
  Administered 2015-10-17 – 2015-10-18 (×2): 25 mg via ORAL
  Filled 2015-10-16 (×3): qty 1

## 2015-10-16 MED ORDER — SODIUM CHLORIDE 0.9 % IV SOLN
INTRAVENOUS | Status: DC
  Administered 2015-10-16: 18:00:00 via INTRAVENOUS

## 2015-10-16 MED ORDER — PHENOL 1.4 % MT LIQD: 1.0000 | OROMUCOSAL | Status: DC | PRN

## 2015-10-16 MED ORDER — FUROSEMIDE 20 MG PO TABS: 20.0000 mg | ORAL_TABLET | Freq: Every day | ORAL | Status: DC | PRN

## 2015-10-16 MED ORDER — ROCURONIUM BROMIDE 50 MG/5ML IV SOLN
INTRAVENOUS | Status: AC
  Filled 2015-10-16: qty 1

## 2015-10-16 MED ORDER — BISACODYL 10 MG RE SUPP: 10.0000 mg | Freq: Every day | RECTAL | Status: DC | PRN

## 2015-10-16 MED ORDER — GUAIFENESIN-DM 100-10 MG/5ML PO SYRP: 15.0000 mL | ORAL_SOLUTION | ORAL | Status: DC | PRN

## 2015-10-16 MED ORDER — DIPHENHYDRAMINE HCL 50 MG/ML IJ SOLN
INTRAMUSCULAR | Status: AC
  Filled 2015-10-16: qty 1

## 2015-10-16 MED ORDER — DILTIAZEM HCL 100 MG IV SOLR
100.0000 mg | INTRAVENOUS | Status: DC | PRN
  Administered 2015-10-16: 20 mg via INTRAVENOUS

## 2015-10-16 MED ORDER — METOPROLOL TARTRATE 5 MG/5ML IV SOLN: 2.0000 mg | INTRAVENOUS | Status: DC | PRN

## 2015-10-16 MED ORDER — SERTRALINE HCL 50 MG PO TABS
50.0000 mg | ORAL_TABLET | Freq: Every day | ORAL | Status: DC
  Administered 2015-10-16 – 2015-10-18 (×3): 50 mg via ORAL
  Filled 2015-10-16 (×3): qty 1

## 2015-10-16 MED ORDER — ALUM & MAG HYDROXIDE-SIMETH 200-200-20 MG/5ML PO SUSP: 15.0000 mL | ORAL | Status: DC | PRN

## 2015-10-16 SURGICAL SUPPLY — 70 items
BANDAGE ELASTIC 4 VELCRO ST LF (GAUZE/BANDAGES/DRESSINGS) IMPLANT
BANDAGE ESMARK 6X9 LF (GAUZE/BANDAGES/DRESSINGS) IMPLANT
BNDG ESMARK 6X9 LF (GAUZE/BANDAGES/DRESSINGS)
CANISTER SUCTION 2500CC (MISCELLANEOUS) ×3 IMPLANT
CANNULA VESSEL 3MM 2 BLNT TIP (CANNULA) ×3 IMPLANT
CATH EMB 4FR 80CM (CATHETERS) ×3 IMPLANT
CLIP TI MEDIUM 24 (CLIP) ×3 IMPLANT
CLIP TI WIDE RED SMALL 24 (CLIP) ×3 IMPLANT
COVER PROBE W GEL 5X96 (DRAPES) ×3 IMPLANT
CUFF TOURNIQUET SINGLE 24IN (TOURNIQUET CUFF) IMPLANT
CUFF TOURNIQUET SINGLE 34IN LL (TOURNIQUET CUFF) IMPLANT
CUFF TOURNIQUET SINGLE 44IN (TOURNIQUET CUFF) IMPLANT
DRAIN CHANNEL 15F RND FF W/TCR (WOUND CARE) IMPLANT
DRAPE PROXIMA HALF (DRAPES) IMPLANT
DRAPE X-RAY CASS 24X20 (DRAPES) IMPLANT
DRSG COVADERM 4X10 (GAUZE/BANDAGES/DRESSINGS) IMPLANT
DRSG COVADERM 4X8 (GAUZE/BANDAGES/DRESSINGS) IMPLANT
ELECT REM PT RETURN 9FT ADLT (ELECTROSURGICAL) ×3
ELECTRODE REM PT RTRN 9FT ADLT (ELECTROSURGICAL) ×1 IMPLANT
EVACUATOR SILICONE 100CC (DRAIN) IMPLANT
FELT TEFLON 1X6 (MISCELLANEOUS) ×3 IMPLANT
GLOVE BIO SURGEON STRL SZ 6.5 (GLOVE) ×14 IMPLANT
GLOVE BIO SURGEONS STRL SZ 6.5 (GLOVE) ×7
GLOVE BIOGEL PI IND STRL 6.5 (GLOVE) ×5 IMPLANT
GLOVE BIOGEL PI IND STRL 7.0 (GLOVE) ×4 IMPLANT
GLOVE BIOGEL PI IND STRL 7.5 (GLOVE) ×1 IMPLANT
GLOVE BIOGEL PI INDICATOR 6.5 (GLOVE) ×10
GLOVE BIOGEL PI INDICATOR 7.0 (GLOVE) ×8
GLOVE BIOGEL PI INDICATOR 7.5 (GLOVE) ×2
GLOVE ECLIPSE 6.5 STRL STRAW (GLOVE) ×6 IMPLANT
GLOVE SURG SS PI 7.5 STRL IVOR (GLOVE) ×3 IMPLANT
GOWN STRL REUS W/ TWL LRG LVL3 (GOWN DISPOSABLE) ×4 IMPLANT
GOWN STRL REUS W/ TWL XL LVL3 (GOWN DISPOSABLE) ×1 IMPLANT
GOWN STRL REUS W/TWL LRG LVL3 (GOWN DISPOSABLE) ×8
GOWN STRL REUS W/TWL XL LVL3 (GOWN DISPOSABLE) ×2
GRAFT PROPATEN W/RING 6X80X60 (Vascular Products) ×3 IMPLANT
HEMOSTAT SNOW SURGICEL 2X4 (HEMOSTASIS) IMPLANT
INSERT FOGARTY SM (MISCELLANEOUS) IMPLANT
KIT BASIN OR (CUSTOM PROCEDURE TRAY) ×3 IMPLANT
KIT ROOM TURNOVER OR (KITS) ×3 IMPLANT
LIQUID BAND (GAUZE/BANDAGES/DRESSINGS) ×6 IMPLANT
MARKER GRAFT CORONARY BYPASS (MISCELLANEOUS) IMPLANT
NS IRRIG 1000ML POUR BTL (IV SOLUTION) ×6 IMPLANT
PACK PERIPHERAL VASCULAR (CUSTOM PROCEDURE TRAY) ×3 IMPLANT
PAD ARMBOARD 7.5X6 YLW CONV (MISCELLANEOUS) ×3 IMPLANT
PADDING CAST COTTON 6X4 STRL (CAST SUPPLIES) IMPLANT
SET COLLECT BLD 21X3/4 12 (NEEDLE) IMPLANT
SPONGE INTESTINAL PEANUT (DISPOSABLE) ×3 IMPLANT
SPONGE LAP 18X18 X RAY DECT (DISPOSABLE) ×3 IMPLANT
STOPCOCK 4 WAY LG BORE MALE ST (IV SETS) ×3 IMPLANT
SUT ETHILON 3 0 PS 1 (SUTURE) IMPLANT
SUT GORETEX 6.0 TT13 (SUTURE) ×9 IMPLANT
SUT GORETEX 6.0 TT9 (SUTURE) ×3 IMPLANT
SUT PROLENE 5 0 C 1 24 (SUTURE) ×12 IMPLANT
SUT PROLENE 6 0 BV (SUTURE) ×6 IMPLANT
SUT PROLENE 7 0 BV 1 (SUTURE) IMPLANT
SUT SILK 2 0 SH (SUTURE) ×3 IMPLANT
SUT SILK 3 0 (SUTURE)
SUT SILK 3-0 18XBRD TIE 12 (SUTURE) IMPLANT
SUT VIC AB 2-0 CT1 27 (SUTURE) ×4
SUT VIC AB 2-0 CT1 TAPERPNT 27 (SUTURE) ×2 IMPLANT
SUT VIC AB 3-0 SH 27 (SUTURE) ×4
SUT VIC AB 3-0 SH 27X BRD (SUTURE) ×2 IMPLANT
SUT VICRYL 4-0 PS2 18IN ABS (SUTURE) ×6 IMPLANT
SYR 5ML LL (SYRINGE) ×3 IMPLANT
TAPE UMBILICAL COTTON 1/8X30 (MISCELLANEOUS) IMPLANT
TRAY FOLEY W/METER SILVER 16FR (SET/KITS/TRAYS/PACK) ×3 IMPLANT
TUBING EXTENTION W/L.L. (IV SETS) IMPLANT
UNDERPAD 30X30 INCONTINENT (UNDERPADS AND DIAPERS) ×3 IMPLANT
WATER STERILE IRR 1000ML POUR (IV SOLUTION) ×3 IMPLANT

## 2015-10-16 NOTE — Transfer of Care (Addendum)
Immediate Anesthesia Transfer of Care Note  Patient: Theresa Bird  Procedure(s) Performed: Procedure(s): RIGHT BYPASS GRAFT FEMORAL-BELOW KNEE POPLITEAL ARTERY USING 6 MM PROPATEN GORTEX GRAFT (Right)  Patient Location: PACU  Anesthesia Type:General  Level of Consciousness: awake  Airway & Oxygen Therapy: Patient Spontanous Breathing and Patient connected to nasal cannula oxygen  Post-op Assessment: Report given to RN, Post -op Vital signs reviewed and stable and Patient moving all extremities X 4, Cardizem gtt continues at 15 mg/hr.  Post vital signs: Reviewed and stable  Last Vitals:  Filed Vitals:   10/16/15 0547  BP: 124/66  Pulse: 107  Temp: 36.8 C  Resp: 18    Last Pain:  Filed Vitals:   10/16/15 0555  PainSc: 6       Patients Stated Pain Goal: 1 (10/16/15 [id number])         Complications:  No anesthetic complications

## 2015-10-16 NOTE — Progress Notes (Signed)
Arrived pacu with cardizem infusing @ 32m/ghr

## 2015-10-16 NOTE — Progress Notes (Signed)
Inpatient Diabetes Program Recommendations  AACE/ADA: New Consensus Statement on Inpatient Glycemic Control (2015)  Target Ranges:  Prepandial:   less than 140 mg/dL      Peak postprandial:   less than 180 mg/dL (1-2 hours)      Critically ill patients:  140 - 180 mg/dL   Review of Glycemic Control  Diabetes history: DM 2 Outpatient Diabetes medications: 70/30 15 units BID Current orders for Inpatient glycemic control: Novolog Sensitive TID  Inpatient Diabetes Program Recommendations:  Consult for DM management post op Glucose within inpatient goal, patient is also NPO currently. While NPO agree with Novolog Sensitive TID. When eating please consider adding a portion of patient's 70/30 dose, 70/30 10 units BID to start, can titrate if glucose trends are elevated.  Thanks,  Tama Headings RN, MSN, [facility] Inpatient Diabetes Coordinator Team Pager [phone] (8a-5p)

## 2015-10-16 NOTE — Interval H&P Note (Signed)
History and Physical Interval Note:  10/16/2015 7:28 AM  Theresa Bird  has presented today for surgery, with the diagnosis of Peripheral vascular disease with right foot ulcer I70.234  The various methods of treatment have been discussed with the patient and family. After consideration of risks, benefits and other options for treatment, the patient has consented to  Procedure(s): BYPASS GRAFT FEMORAL-BELOW KNEE POPLITEAL ARTERY (Right) as a surgical intervention .  The patient's history has been reviewed, patient examined, no change in status, stable for surgery.  I have reviewed the patient's chart and labs.  Questions were answered to the patient's satisfaction.     Theresa Bird, Theresa Bird  UA positive.  Pt says she has a chronic UTI.Marland Kitchen HAs been on abx.  No sx's Stopped Eliquis.  Plan for fem pop today   Theresa Bird Theresa Bird

## 2015-10-16 NOTE — Progress Notes (Signed)
  Day of Surgery Note    Subjective:  C/o some pain  Filed Vitals:   10/16/15 1200 10/16/15 1215  BP:    Pulse: 97 98  Temp:    Resp: 20 19    Incisions:   Right groin incision is clean and dry and soft without hematoma Extremities:  + doppler signal right AT/PT Lungs:  Non labored    Assessment/Plan:  This is a 82 y.o. female who is s/p right femoral to below knee popliteal bypass grafting with 74mm ringed Gortex   -pt doing well in recovery with + doppler signals right AT/PT -pt with hx of Afib & cardioversion x 2 in 2016-went into afib in the OR and is now on Cardizem gtt.  Cardiology consulted -plan to restart Eliquis tomorrow if no evidence of bleeding -pt with UTI-given synthetic graft material, will keep pt on 10 day course of Cipro, which was started yesterday. -continue with pain control -to [place] when bed available   Leontine Locket, PA-C 10/16/2015 12:23 PM

## 2015-10-16 NOTE — Anesthesia Postprocedure Evaluation (Signed)
Anesthesia Post Note  Patient: Theresa Bird  Procedure(s) Performed: Procedure(s) (LRB): RIGHT BYPASS GRAFT FEMORAL-BELOW KNEE POPLITEAL ARTERY USING 6 MM PROPATEN GORTEX GRAFT (Right)  Patient location during evaluation: PACU Anesthesia Type: General Level of consciousness: sedated Pain management: pain level controlled Vital Signs Assessment: post-procedure vital signs reviewed and stable Respiratory status: spontaneous breathing and respiratory function stable Cardiovascular status: stable Anesthetic complications: no    Last Vitals:  Filed Vitals:   10/16/15 1200 10/16/15 1215  BP:    Pulse: 97 98  Temp:    Resp: 20 19    Last Pain:  Filed Vitals:   10/16/15 1221  PainSc: 6                  [name] DANIEL

## 2015-10-16 NOTE — Progress Notes (Addendum)
Pressure area noted to coccyx area. Sacral pad applied to area per protocol. Area to coccyx is yellow in color with red around. Pt states she also has sores to left heel and right lower leg. Dressings in place.

## 2015-10-16 NOTE — Anesthesia Procedure Notes (Signed)
Procedure Name: Intubation Date/Time: 10/16/2015 7:47 AM Performed by: Rejeana Brock L Pre-anesthesia Checklist: Patient identified, Timeout performed, Emergency Drugs available, Suction available and Patient being monitored Patient Re-evaluated:Patient Re-evaluated prior to inductionOxygen Delivery Method: Circle system utilized Preoxygenation: Pre-oxygenation with 100% oxygen Intubation Type: IV induction Ventilation: Mask ventilation without difficulty Laryngoscope Size: Mac and 3 Grade View: Grade I Tube type: Oral Tube size: 7.0 mm Number of attempts: 1 Airway Equipment and Method: Stylet Placement Confirmation: ETT inserted through vocal cords under direct vision,  positive ETCO2 and breath sounds checked- equal and bilateral Secured at: 21 cm Tube secured with: Tape Dental Injury: Teeth and Oropharynx as per pre-operative assessment

## 2015-10-16 NOTE — H&P (View-Only) (Signed)
[facility] [place]  Patient name: Theresa Bird MRN: [id number] DOB: 1933-12-04 Sex: female  REASON FOR VISIT: pre-operative evaluation right leg revascularization  HPI: Theresa Bird is a 82 y.o. female who presents for evaluation prior to right femoral popliteal bypass on 09/25/2015 for a right foot ulcer. This ulcer has been stable for the past 5 months. The patient recently fractured her left hip and underwent repair of this on 09/05/2015 by Dr. Sharol Given. She is currently in a skilled nursing facility. She also had abnormal CBC lab results including elevated platelet count and white blood cell count in her labs recently. She has been referred to hematology regarding this. She is currently ambulating minimally. She is on Eliquis for atrial fibrillation.  Past Medical History  Diagnosis Date  . Depression   . Essential hypertension, benign   . Hypercholesteremia   . Insulin dependent diabetes mellitus ([place])   . Essential thrombocytosis (HCC)     JAK2 negative  . Coronary atherosclerosis of native coronary artery     Multivessel status post CABG in [place]  . Atrial fibrillation ([place])   . Myelofibrosis ([place])   . PAD (peripheral artery disease) ([place])   . Myocardial infarction ([place])   . Dysrhythmia     A FIB  . Seizure ([place])     NONE IN 15 YEARS  . COPD (chronic obstructive pulmonary disease) (HCC)     USES 2L O2  HS  . Shortness of breath dyspnea     W/ EXERTION   . Tuberculosis     PARENTS BOTH HAD TB   . History of kidney stones     Family History  Problem Relation Age of Onset  . Coronary artery disease Mother     SOCIAL HISTORY: Social History  Substance Use Topics  . Smoking status: Never Smoker   . Smokeless tobacco: Never Used  . Alcohol Use: 0.0 oz/week    0 Standard drinks or equivalent per week     Comment: Socially    No Known Allergies  Current Outpatient Prescriptions  Medication Sig Dispense Refill  . acetaminophen  (TYLENOL) 500 MG tablet Take 1 tablet (500 mg total) by mouth every 6 (six) hours as needed for mild pain. 30 tablet 0  . albuterol (PROAIR HFA) 108 (90 BASE) MCG/ACT inhaler Inhale 2 puffs into the lungs every 6 (six) hours as needed for wheezing or shortness of breath.    Marland Kitchen alendronate (FOSAMAX) 35 MG tablet Take 1 tablet (35 mg total) by mouth every 7 (seven) days. Take with a full glass of water on an empty stomach. 4 tablet 0  . apixaban (ELIQUIS) 2.5 MG TABS tablet Take 1 tablet (2.5 mg total) by mouth 2 (two) times daily. 60 tablet 0  . aspirin EC 81 MG tablet Take 81 mg by mouth daily.    Marland Kitchen atorvastatin (LIPITOR) 40 MG tablet Take 40 mg by mouth every evening.    . calcium carbonate (OS-CAL) 600 MG tablet Take 1 tablet (600 mg total) by mouth 2 (two) times daily with a meal. 60 tablet 0  . cholecalciferol (VITAMIN D) 1000 units tablet Take 1 tablet (1,000 Units total) by mouth daily. 30 tablet 0  . diltiazem (CARTIA XT) 180 MG 24 hr capsule Take 1 capsule (180 mg total) by mouth daily. 30 capsule 6  . fentaNYL (DURAGESIC - DOSED MCG/HR) 25 MCG/HR patch Place 25 mcg onto the skin every 3 (three) days.    . furosemide (  LASIX) 20 MG tablet Take 20 mg by mouth daily.     . hydroxyurea (HYDREA) 500 MG capsule Take 500 mg by mouth 2 (two) times daily.     . insulin aspart protamine - aspart (NOVOLOG MIX 70/30 FLEXPEN) (70-30) 100 UNIT/ML FlexPen Inject 15 Units into the skin 2 (two) times daily.    . Melatonin-Pyridoxine (MELATIN PO) Take 3 mg by mouth at bedtime.    . metoprolol succinate (TOPROL-XL) 25 MG 24 hr tablet Take 25 mg by mouth daily.    Marland Kitchen oxyCODONE-acetaminophen (PERCOCET/ROXICET) 5-325 MG tablet Take 1 tablet by mouth every 4 (four) hours as needed for severe pain. 30 tablet 0  . OXYGEN Inhale 2 L into the lungs.    . sertraline (ZOLOFT) 50 MG tablet Take 50 mg by mouth daily.    . traMADol (ULTRAM) 50 MG tablet Take 50 mg by mouth every 6 (six) hours as needed for moderate pain.      . vitamin C (ASCORBIC ACID) 500 MG tablet Take 500 mg by mouth 2 (two) times daily.     No current facility-administered medications for this visit.    REVIEW OF SYSTEMS:  [X]  denotes positive finding, [ ]  denotes negative finding Cardiac  Comments:  Chest pain or chest pressure:    Shortness of breath upon exertion:    Short of breath when lying flat:    Irregular heart rhythm:        Vascular    Pain in calf, thigh, or hip brought on by ambulation:    Pain in feet at night that wakes you up from your sleep:     Blood clot in your veins:    Leg swelling:         Pulmonary    Oxygen at home:    Productive cough:     Wheezing:         Neurologic    Sudden weakness in arms or legs:     Sudden numbness in arms or legs:     Sudden onset of difficulty speaking or slurred speech:    Temporary loss of vision in one eye:     Problems with dizziness:         Gastrointestinal    Blood in stool:     Vomited blood:         Genitourinary    Burning when urinating:     Blood in urine:        Psychiatric    Major depression:         Hematologic    Bleeding problems:    Problems with blood clotting too easily:        Skin    Rashes or ulcers: x       Constitutional    Fever or chills:      PHYSICAL EXAM: Filed Vitals:   09/21/15 1548  BP: 99/57  Pulse: 100  Temp: 97.1 F (36.2 C)  TempSrc: Oral  Resp: 14  Height: 5\' 8"  (1.727 m)  Weight: 148 lb (67.132 kg)  SpO2: 93%    GENERAL: The patient is an elderly  female, in no acute distress. The vital signs are documented above. VASCULAR: Right foot ulcers clean.  PULMONARY: Nonlabored respiratory effort. MUSCULOSKELETAL: There are no major deformities or cyanosis. NEUROLOGIC: No focal weakness or paresthesias are detected. SKIN: There are no ulcers or rashes noted. PSYCHIATRIC: The patient has a normal affect.   MEDICAL ISSUES:  Right foot ulceration  The  patient was previously scheduled for right  femoral-popliteal bypass on 09/25/2015. The patient recently fractured her left hip and underwent surgery at this a few weeks ago. She also had abnormal CBC lab results. She has seen hematology for this and will follow-up again soon. We do not have access to these records at this time. Given her recent injury and her stable right foot ulcerations, will postpone her revascularization for 4 weeks. She is tentatively scheduled for right femoral-popliteal bypass on 10/16/2015. She'll need to be off of her Eliquis for 72 hours prior to this.  Virgina Jock, PA-C [facility] [place]     I agree with the above.  I have seen and evaluated the patient.  She has a chronic ulcer on the right foot which is been there for approximately 8-9 months.  She underwent angiography which showed an occluded above-knee popliteal artery with a diseased single-vessel runoff via the anterior tibial artery.  I did not think she was a good candidate for endovascular repair given her diseased single-vessel runoff and chronic total occlusion.  Therefore she was scheduled to undergo a right femoral to below knee popliteal artery bypass.  Unfortunately, the patient fell and broke her hip.  This was treated by Dr. Sharol Given several weeks ago.  She is trying to recover from that.  I brought her in today to evaluate her mobility.  Currently, she can only walk 2 or 3 steps.  In addition the patient's preoperative lab work showed an elevated white count and platelet count.  She is followed by hematology.  I need to get their note for clearance for surgery.  I think overall, the patient is too debilitated to proceed with right femoral-popliteal bypass graft.  I'm going to delay her operation at least 6 weeks.  She will keep me updated on the progress of her ulcer and if it deteriorates we may need to schedule this sooner.  She does take Eliquis which will need to be stopped before surgery  Annamarie Major

## 2015-10-16 NOTE — Progress Notes (Signed)
Assisted pt to the bathroom, but unable to void at this time.

## 2015-10-16 NOTE — Consult Note (Signed)
CARDIOLOGY CONSULT NOTE   Patient ID: MESHELL WHEATON MRN: [id number], DOB/AGE: 80-Sep-1935   Admit date: 10/16/2015 Date of Consult: 10/16/2015   Primary Physician: Neale Burly, MD Primary Cardiologist: Dr. Domenic Polite  Pt. Profile  Mrs. Garbutt is a pleasant 82 year old Caucasian female with past medical history of HTN, IDDM, COPD, seizure, myelofibrosis, CAD s/p CABG 2002, chronic A. fib on eliquis, and PAD presented for R fem pop, during surgery noted to be in afib with RVR  Problem List  Past Medical History  Diagnosis Date  . Depression   . Essential hypertension, benign   . Hypercholesteremia   . Insulin dependent diabetes mellitus ([place])   . Essential thrombocytosis (HCC)     JAK2 negative  . Coronary atherosclerosis of native coronary artery     Multivessel status post CABG in [place]  . Atrial fibrillation ([place])   . Myelofibrosis ([place])   . PAD (peripheral artery disease) ([place])   . Myocardial infarction ([place])   . Dysrhythmia     A FIB  . Seizure ([place])     NONE IN 15 YEARS  . COPD (chronic obstructive pulmonary disease) (HCC)     USES 2L O2  HS  . Shortness of breath dyspnea     W/ EXERTION   . Tuberculosis     PARENTS BOTH HAD TB   . History of kidney stones     Past Surgical History  Procedure Laterality Date  . Abdominal hysterectomy    . Cataract extraction    . Coronary artery bypass graft      Possibly 2002 in [place]  . Cardioversion N/A 09/09/2014    Procedure: CARDIOVERSION;  Surgeon: Josue Hector, MD;  Location: [facility] ENDOSCOPY;  Service: Cardiovascular;  Laterality: N/A;  . Cardioversion N/A 10/03/2014    Procedure: CARDIOVERSION;  Surgeon: Arnoldo Lenis, MD;  Location: [facility] ORS;  Service: Endoscopy;  Laterality: N/A;  . Peripheral vascular catheterization N/A 08/27/2015    Procedure: Abdominal Aortogram w/Lower Extremity;  Surgeon: Conrad Woodland, MD;  Location: [place] CV LAB;  Service: Cardiovascular;  Laterality: N/A;  . Intramedullary  (im) nail intertrochanteric Left 09/05/2015    Procedure: INTRAMEDULLARY (IM) NAIL INTERTROCHANTRIC;  Surgeon: Newt Minion, MD;  Location: [place];  Service: Orthopedics;  Laterality: Left;     Allergies  No Known Allergies  HPI   Mrs. Daley is a pleasant 82 year old Caucasian female with past medical history of HTN, IDDM, COPD, seizure, myelofibrosis, CAD s/p CABG 2002, chronic A. fib on eliquis, and PAD s/p R fem pop. According to the patient, she had bypass surgery at [facility] in 2002 with unknown number of grafts. Her last Myoview in 2013 show no significant ischemia. She had an echocardiogram obtained in 02/2014 at [facility] which showed normal EF 60-65%, mild MR, PA peak pressure 36 mmHg. She was admitted to [facility] on 09/06/2004 for atrial flutter ablation with RVR and eventually underwent successful DC cardioversion on 09/09/2014. She was loaded with amiodarone during the same admission. That hospitalization course was complicated by left arm cellulitis, phlebitis and UTI requiring infectious disease consult. She was discharged on 400 mg daily amiodarone and Xarelto. Unfortunately by the time she followed up in the clinic on 10/02/2014, she has already went back into atrial fibrillation. She underwent repeat DC cardioversion on the following day on 10/03/2014. She was seen in the atrial fib relation clinic on 10/14/2014, she was felt not to be optimal ablation  candidate, since amiodarone is one of the most effective antiarrhythmic medication, therefore no alternative antiarrhythmic medication was recommended. She was admitted to the hospital again in August 2016 with seizure-like event. She was noted to be back in atrial fibrillation with rapid ventricular response. Given recurrence of atrial fibrillation, decision was made to attempt rate control therapy only without any further pursuit of cardioversion unless absolutely necessary. It appears by the time  she was followed in the cardiology office in February 2017, she has always ready been off of amiodarone and switched to eliquis.  Due to the slow healing right lower extremity ulcer, she underwent extremity angiography on 08/27/2015 which noted greater than 90% proximal anterior tibial stenosis. She was discharged to [facility] where she subsequently suffered a mechanical fall and left comminuted intertrochanteric hip fracture and underwent intramedullary nailing by Dr. Sharol Given on 09/05/2015. After discussing various options with Dr. Trula Slade, patient was brought back to [facility] on 10/16/2015 for right femoropopliteal surgery. During surgery, she was noted to be in A. fib with RVR with heart rate 100 -110s. She was restarted on Cardizem drip. Per vascular surgery note, the plan is to restart her eliquis tomorrow. Her last dose of eliquis appears to be 2 days prior to current surgery. Urinalysis also shows positive nitrite consistent with UTI. Patient has been started on 10 day course of Cipro. Her current heart rate is high 90s to low 100 on 15 of IV diltiazem. Cardiology has been consulted to manage atrial fibrillation.    Inpatient Medications  . chlorhexidine  60 mL Topical Once   And  . [START ON 10/17/2015] chlorhexidine  60 mL Topical Once  . Chlorhexidine Gluconate Cloth  6 each Topical Once   And  . Chlorhexidine Gluconate Cloth  6 each Topical Once  . Chlorhexidine Gluconate Cloth  6 each Topical Once   And  . Chlorhexidine Gluconate Cloth  6 each Topical Once  . diltiazem (CARDIZEM) infusion  5-15 mg/hr Intravenous To OR    Family History Family History  Problem Relation Age of Onset  . Coronary artery disease Mother   . COPD Other      Social History Social History   Social History  . Marital Status: Widowed    Spouse Name: N/A  . Number of Children: N/A  . Years of Education: N/A   Occupational History  . Retired     Environmental manager   Social History  Main Topics  . Smoking status: Never Smoker   . Smokeless tobacco: Never Used  . Alcohol Use: 0.0 oz/week    0 Standard drinks or equivalent per week     Comment: Socially  . Drug Use: No  . Sexual Activity: Not on file   Other Topics Concern  . Not on file   Social History Narrative     Review of Systems  General:  No chills, fever, night sweats or weight changes. Drowsy after waking up from sedation Cardiovascular:  No chest pain, dyspnea on exertion, edema, orthopnea, palpitations, paroxysmal nocturnal dyspnea. Dermatological: No rash, lesions/masses Respiratory: No cough, dyspnea Urologic: No hematuria, dysuria Abdominal:   No nausea, vomiting, diarrhea, bright red blood per rectum, melena, or hematemesis Neurologic:  No visual changes, wkns, changes in mental status. All other systems reviewed and are otherwise negative except as noted above.  Physical Exam  Blood pressure 131/67, pulse 98, temperature 98.4 F (36.9 C), temperature source Oral, resp. rate 19, height 5\' 9"  (1.753 m), weight 149 lb (  67.586 kg), SpO2 97 %.  General: Pleasant, NAD Psych: Normal affect. Neuro: Alert and oriented X 3. Moves all extremities spontaneously. HEENT: Normal  Neck: Supple without bruits or JVD. Lungs:  Resp regular and unlabored, CTA. Heart: regularly irregular. no s3, s4. 2/6 systolic murmur at apex Abdomen: Soft, non-tender, non-distended, BS + x 4.  Extremities: No clubbing, cyanosis or edema. DP/PT/Radials 2+ and equal bilaterally.  Labs  No results for input(s): CKTOTAL, CKMB, TROPONINI in the last 72 hours. Lab Results  Component Value Date   WBC 16.9* 10/16/2015   HGB 12.7 10/16/2015   HCT 38.6 10/16/2015   MCV 117.7* 10/16/2015   PLT 694* 10/16/2015    Recent Labs Lab 10/13/15 1559  NA 138  K 4.3  CL 98*  CO2 28  BUN 11  CREATININE 0.54  CALCIUM 9.2  PROT 6.4*  BILITOT 0.7  ALKPHOS 90  ALT 15  AST 24  GLUCOSE 140*   Lab Results  Component Value  Date   CHOL 145 09/09/2014   HDL 29* 09/09/2014   LDLCALC 100* 09/09/2014   TRIG 79 09/09/2014   No results found for: DDIMER  Radiology/Studies  No results found.  ECG  Pending  ASSESSMENT AND PLAN  1. Chronic atrial fibrillation on Eliquis  - Eliquis held since 2 days ago, per vascular surgery plan to restart it tomorrow.  - no EKG was obtained prior to surgery, however patient has known persistent afib since at least Aug 2016. Although vascular surgery note says she went into afib in the OR, it is also possible that she has been staying in afib even before the surgery, it is very hard to tell what is the current rhythm on telemetry, whether it is afib, aflutter or sinus tach. Will obtain 12 leads EKG  - she has had 2 failed DCCV in 2016, current plan is to leave her in afib and continue rate control with systemic anticoagulation only  - continue IV diltiazem for now along with home dose of PO metoprolol, likely transition back to PO diltiazem tomorrow.  2. PAD s/p R fem pop: managed by vascular surgery  3. CAD s/p CABG 2002: no recent angina, does have J point depression noted on telemetry, unclear if real, will obtain EKG. She denies any recent CP. Unknown coronary anatomy, per patient, CABG was done in [place]  - She does have a systolic murmur at apex that she was not aware of, doubt severe valvular disease, may either followup with inpatient or outpatient echo  4. HTN 5. IDDM 6. H/o seizure 7. myelofibrosis 8. UTI: per vascular, starting Cipro   Signed, Almyra Deforest, PA-C 10/16/2015, 12:32 PM

## 2015-10-17 LAB — BASIC METABOLIC PANEL
ANION GAP: 8 (ref 5–15)
BUN: 14 mg/dL (ref 6–20)
CHLORIDE: 99 mmol/L — AB (ref 101–111)
CO2: 30 mmol/L (ref 22–32)
Calcium: 8.3 mg/dL — ABNORMAL LOW (ref 8.9–10.3)
Creatinine, Ser: 0.71 mg/dL (ref 0.44–1.00)
GFR calc non Af Amer: >60 mL/min (ref >60)
Glucose, Bld: 138 mg/dL — ABNORMAL HIGH (ref 65–99)
POTASSIUM: 4.2 mmol/L (ref 3.5–5.1)
Sodium: 137 mmol/L (ref 135–145)

## 2015-10-17 LAB — CBC
HEMATOCRIT: 33 % — AB (ref 36.0–46.0)
HEMOGLOBIN: 10.4 g/dL — AB (ref 12.0–15.0)
MCH: 38.4 pg — ABNORMAL HIGH (ref 26.0–34.0)
MCHC: 31.5 g/dL (ref 30.0–36.0)
MCV: 121.8 fL — AB (ref 78.0–100.0)
Platelets: 544 10*3/uL — ABNORMAL HIGH (ref 150–400)
RBC: 2.71 MIL/uL — ABNORMAL LOW (ref 3.87–5.11)
RDW: 18.1 % — ABNORMAL HIGH (ref 11.5–15.5)
WBC: 13.4 10*3/uL — AB (ref 4.0–10.5)

## 2015-10-17 LAB — GLUCOSE, CAPILLARY
GLUCOSE-CAPILLARY: 258 mg/dL — AB (ref 65–99)
GLUCOSE-CAPILLARY: 202 mg/dL — AB (ref 65–99)
Glucose-Capillary: 236 mg/dL — ABNORMAL HIGH (ref 65–99)
Glucose-Capillary: 138 mg/dL — ABNORMAL HIGH (ref 65–99)

## 2015-10-17 MED ORDER — APIXABAN 2.5 MG PO TABS
2.5000 mg | ORAL_TABLET | Freq: Two times a day (BID) | ORAL | Status: DC
  Administered 2015-10-17 – 2015-10-18 (×3): 2.5 mg via ORAL
  Filled 2015-10-17 (×3): qty 1

## 2015-10-17 MED ORDER — TRAMADOL HCL 50 MG PO TABS: 50.0000 mg | ORAL_TABLET | Freq: Four times a day (QID) | ORAL | Status: DC | PRN

## 2015-10-17 MED ORDER — CIPROFLOXACIN HCL 500 MG PO TABS
500.0000 mg | ORAL_TABLET | Freq: Two times a day (BID) | ORAL | Status: DC
  Administered 2015-10-17 – 2015-10-18 (×2): 500 mg via ORAL
  Filled 2015-10-17 (×2): qty 1

## 2015-10-17 NOTE — NC FL2 (Signed)
[place] LEVEL OF CARE SCREENING TOOL     IDENTIFICATION  Patient Name: Theresa Bird Birthdate: 27-Nov-1933 Sex: female Admission Date (Current Location): 10/16/2015  [facility] and [place] Number:  Herbalist and Address:  [facility], [place] [address] [place], [place] [address]      Provider Number: [id number]  Attending Physician Name and Address:  Serafina Mitchell, MD  Relative Name and Phone Number:  Mariann Laster, daughter, [phone]    Current Level of Care: Hospital Recommended Level of Care: [place] Prior Approval Number:    Date Approved/Denied:   PASRR Number:    Discharge Plan: SNF    Current Diagnoses: Patient Active Problem List   Diagnosis Date Noted  . Persistent atrial fibrillation ([place])   . Hip fracture, left ([place])   . PVD (peripheral vascular disease) ([place])   . Diabetic ulcer of right foot associated with diabetes mellitus due to underlying condition ([place])   . Closed fracture of left hip ([place]) 09/04/2015  . Pressure ulcer 08/27/2015  . PAD (peripheral artery disease) ([place]) 08/26/2015  . Seizure ([place]) 01/26/2015  . Atrial fibrillation with rapid ventricular response ([place]) 01/26/2015  . Phlebitis   . Screen for STD (sexually transmitted disease)   . Essential thrombocythemia ([place])   . Myelofibrosis ([place])   . UTI (urinary tract infection) 09/08/2014  . Atrial fibrillation with RVR ([place]) 09/07/2014  . Cellulitis of left upper arm 09/07/2014  . Paroxysmal atrial fibrillation ([place]) 04/08/2013  . Essential hypertension 04/08/2013  . CAFL (chronic airflow limitation) ([place]) 05/11/2012  . Essential hemorrhagic thrombocythemia ([place]) 05/11/2012  . Clinical depression 07/13/2011  . Diabetes ([place]) 07/13/2011  . DM2 (diabetes mellitus, type 2) ([place]) 06/19/2009  . Dyslipidemia 06/19/2009  . CORONARY ATHEROSCLEROSIS NATIVE CORONARY ARTERY 06/19/2009    Orientation RESPIRATION BLADDER Height & Weight      Self, Time, Situation, Place  Normal Continent Weight: 65.6 kg (144 lb 10 oz) Height:  5\' 9"  (175.3 cm)  BEHAVIORAL SYMPTOMS/MOOD NEUROLOGICAL BOWEL NUTRITION STATUS     (N/A) Continent Diet (Please see DC summary)  AMBULATORY STATUS COMMUNICATION OF NEEDS Skin   Extensive Assist Verbally PU Stage and Appropriate Care (Unstageable PU on sacrum,foot,buttocks; Stage II PU on Leg;Closed incision on groin and leg;)                       Personal Care Assistance Level of Assistance  Bathing, Feeding, Dressing Bathing Assistance: Limited assistance Feeding assistance: Independent Dressing Assistance: Limited assistance     Functional Limitations Info             SPECIAL CARE FACTORS FREQUENCY  PT (By licensed PT)     PT Frequency: min 3x/week              Contractures      Additional Factors Info  Code Status, Allergies, Psychotropic, Insulin Sliding Scale Code Status Info: Full Allergies Info: NKA Psychotropic Info: Zoloft Insulin Sliding Scale Info: insulin aspart (novoLOG) injection 0-9 Units       Current Medications (10/17/2015):  This is the current hospital active medication list Current Facility-Administered Medications  Medication Dose Route Frequency Provider Last Rate Last Dose  . 0.9 %  sodium chloride infusion  500 mL Intravenous Once PRN Jaydrian Corpening J Judy Goodenow, PA-C      . acetaminophen (TYLENOL) tablet 500 mg  500 mg Oral Q6H PRN Meeya Goldin J Annajulia Lewing, PA-C      .  albuterol (PROVENTIL) (2.5 MG/3ML) 0.083% nebulizer solution 2.5 mg  2.5 mg Inhalation Q6H PRN Maite Burlison J Bradly Sangiovanni, PA-C      . alum & mag hydroxide-simeth (MAALOX/MYLANTA) 200-200-20 MG/5ML suspension 15-30 mL  15-30 mL Oral Q2H PRN Jillienne Egner J Breasia Karges, PA-C      . apixaban (ELIQUIS) tablet 2.5 mg  2.5 mg Oral BID Hulen Shouts Emani Morad, PA-C   2.5 mg at 10/17/15 1004  . aspirin EC tablet 81 mg  81 mg Oral Daily Shykeem Resurreccion J Tyriek Hofman, PA-C   81 mg at 10/17/15 1004  . atorvastatin (LIPITOR) tablet 40 mg  40 mg  Oral QPM Kolbi Altadonna J Nashly Olsson, PA-C   40 mg at 10/16/15 1746  . bisacodyl (DULCOLAX) suppository 10 mg  10 mg Rectal Daily PRN Morganna Styles J Calandria Mullings, PA-C      . calcium carbonate (OS-CAL - dosed in mg of elemental calcium) tablet 1,250 mg  1,250 mg Oral BID WC Judie Hollick J Verdia Bolt, PA-C   1,250 mg at 10/17/15 1005  . cholecalciferol (VITAMIN D) tablet 1,000 Units  1,000 Units Oral Daily Hulen Shouts Zakary Kimura, PA-C   1,000 Units at 10/17/15 1004  . ciprofloxacin (CIPRO) tablet 500 mg  500 mg Oral BID Serafina Mitchell, MD      . diltiazem (CARDIZEM CD) 24 hr capsule 180 mg  180 mg Oral Daily Lima Chillemi J Aashir Umholtz, PA-C   180 mg at 10/17/15 1004  . docusate sodium (COLACE) capsule 100 mg  100 mg Oral BID Hulen Shouts Ngoc Detjen, PA-C   100 mg at 10/17/15 1004  . feeding supplement (PRO-STAT SUGAR FREE 64) liquid 60 mL  60 mL Oral TID WC Tashara Suder J Ellyanna Holton, PA-C   60 mL at 10/17/15 1003  . fentaNYL (DURAGESIC - dosed mcg/hr) patch 25 mcg  25 mcg Transdermal Q72H [employer] J Cleland Simkins, PA-C   25 mcg at 10/16/15 1743  . furosemide (LASIX) tablet 20 mg  20 mg Oral Daily PRN Yecheskel Kurek J Paulette Rockford, PA-C      . guaiFENesin-dextromethorphan (ROBITUSSIN DM) 100-10 MG/5ML syrup 15 mL  15 mL Oral Q4H PRN Julie-Anne Torain J Legend Tumminello, PA-C      . hydrALAZINE (APRESOLINE) injection 5 mg  5 mg Intravenous Q20 Min PRN Domingo Fuson J Jayvin Hurrell, PA-C      . hydroxyurea (HYDREA) capsule 500 mg  500 mg Oral BID Hulen Shouts Rubens Cranston, PA-C   500 mg at 10/17/15 1006  . insulin aspart (novoLOG) injection 0-9 Units  0-9 Units Subcutaneous TID WC Hulen Shouts Dylen Mcelhannon, PA-C   1 Units at 10/17/15 1504  . labetalol (NORMODYNE,TRANDATE) injection 10 mg  10 mg Intravenous Q10 min PRN Janasia Coverdale J Lastacia Solum, PA-C      . metoprolol (LOPRESSOR) injection 2-5 mg  2-5 mg Intravenous Q2H PRN Idalys Konecny J Gilliam Hawkes, PA-C      . metoprolol succinate (TOPROL-XL) 24 hr tablet 25 mg  25 mg Oral Daily Haden Cavenaugh J Shanty Ginty, PA-C   25 mg at 10/17/15 1004  . morphine 2 MG/ML injection 1-2 mg  1-2 mg Intravenous Q2H PRN Hulen Shouts  Suliman Termini, PA-C   2 mg at 10/17/15 0048  . multivitamin with minerals tablet 1 tablet  1 tablet Oral Daily Hulen Shouts Chanon Loney, PA-C   1 tablet at 10/17/15 1004  . ondansetron (ZOFRAN) injection 4 mg  4 mg Intravenous Q6H PRN Maelys Kinnick J Laronica Bhagat, PA-C      . oxyCODONE-acetaminophen (PERCOCET/ROXICET) 5-325 MG per tablet 1 tablet  1 tablet Oral Q4H PRN Hulen Shouts Majestic Brister, PA-C   1 tablet at  10/17/15 0601  . pantoprazole (PROTONIX) EC tablet 40 mg  40 mg Oral Daily Lulu Hirschmann J Persais Ethridge, PA-C   40 mg at 10/17/15 1004  . phenol (CHLORASEPTIC) mouth spray 1 spray  1 spray Mouth/Throat PRN Hadden Steig J Braydn Carneiro, PA-C      . potassium chloride SA (K-DUR,KLOR-CON) CR tablet 20-40 mEq  20-40 mEq Oral Daily PRN Traye Bates J Wilena Tyndall, PA-C      . sertraline (ZOLOFT) tablet 50 mg  50 mg Oral Daily Kasaundra Fahrney J Advik Weatherspoon, PA-C   50 mg at 10/17/15 1005  . traMADol (ULTRAM) tablet 50 mg  50 mg Oral Q6H PRN Hulen Shouts Denym Christenberry, PA-C   50 mg at 10/17/15 1515  . vitamin C (ASCORBIC ACID) tablet 500 mg  500 mg Oral BID Hulen Shouts Shaine Newmark, PA-C   500 mg at 10/17/15 1004  . zinc sulfate capsule 220 mg  220 mg Oral Daily Kenzo Ozment J Trevis Eden, PA-C   220 mg at 10/17/15 1007     Discharge Medications: Please see discharge summary for a list of discharge medications.  Relevant Imaging Results:  Relevant Lab Results:   Additional Information [id number]  Benard Halsted, LCSWA

## 2015-10-17 NOTE — Progress Notes (Signed)
Report called pt transferring to [id number] with daugther Mariann Laster and belongings.

## 2015-10-17 NOTE — Discharge Summary (Signed)
Discharge Summary     Theresa Bird 04/25/1934 82 y.o. female  [id number]  Admission Date: 10/16/2015  Discharge Date: 10/18/15  Physician: Serafina Mitchell, MD  Admission Diagnosis: Peripheral vascular disease with right foot ulcer I70.234   HPI:   This is a 82 y.o. female who presents for evaluation prior to right femoral popliteal bypass on 09/25/2015 for a right foot ulcer. This ulcer has been stable for the past 5 months. The patient recently fractured her left hip and underwent repair of this on 09/05/2015 by Dr. Sharol Given. She is currently in a skilled nursing facility. She also had abnormal CBC lab results including elevated platelet count and white blood cell count in her labs recently. She has been referred to hematology regarding this. She is currently ambulating minimally. She is on Eliquis for atrial fibrillation.  Hospital Course:  The patient was admitted to the hospital and taken to the operating room on 10/16/2015 and underwent: right femoral to below knee popliteal bypass grafting with 59mm ringed Gortex    The pt tolerated the procedure well and was transported to the PACU in good condition.  She did go into rapid afib in the OR/pacu and a cardiology consult was obtained.  She was started on a cardizem gtt, which was weaned by POD 1 and po cardizem was started.  She also is being treated for a UTI with a 10 day course of Cipro given she has synthetic graft material.  By POD 1, she was doing well with + doppler signals in the PT/DP/peroneal right foot.  Her wounds on the dorsum of her foot are dry.  She is transferred to [place].  Her blood pressure is soft, however, she states that her BP runs low in the 80's and 90's and she is asymptomatic.  There is no evidence of hematoma and her Eliquis is restarted.      By POD 2, she continued to have brisk doppler signals right foot.  She did work with PT yesterday.  Continue dry dressing to right foot.    The remainder of the  hospital course consisted of increasing mobilization and increasing intake of solids without difficulty.  CBC    Component Value Date/Time   WBC 13.4* 10/17/2015 0549   RBC 2.71* 10/17/2015 0549   HGB 10.4* 10/17/2015 0549   HCT 33.0* 10/17/2015 0549   PLT 544* 10/17/2015 0549   MCV 121.8* 10/17/2015 0549   MCH 38.4* 10/17/2015 0549   MCHC 31.5 10/17/2015 0549   RDW 18.1* 10/17/2015 0549   LYMPHSABS 1.1 09/04/2015 1400   MONOABS 1.1* 09/04/2015 1400   EOSABS 0.4 09/04/2015 1400   BASOSABS 0.2* 09/04/2015 1400    BMET    Component Value Date/Time   NA 137 10/17/2015 0549   K 4.2 10/17/2015 0549   CL 99* 10/17/2015 0549   CO2 30 10/17/2015 0549   GLUCOSE 138* 10/17/2015 0549   BUN 14 10/17/2015 0549   CREATININE 0.71 10/17/2015 0549   CALCIUM 8.3* 10/17/2015 0549   GFRNONAA >60 10/17/2015 0549   GFRAA >60 10/17/2015 0549     Discharge Instructions    Call MD for:  redness, tenderness, or signs of infection (pain, swelling, bleeding, redness, odor or green/yellow discharge around incision site)    Complete by:  As directed      Call MD for:  severe or increased pain, loss or decreased feeling  in affected limb(s)    Complete by:  As directed  Call MD for:  temperature >100.5    Complete by:  As directed      Discharge wound care:    Complete by:  As directed   Wash the groin wound with soap and water daily and pat dry. (No tub bath-only shower)  Then put a dry gauze or washcloth there to keep this area dry daily and as needed.  Do not use Vaseline or neosporin on your incisions.  Only use soap and water on your incisions and then protect and keep dry.     Lifting restrictions    Complete by:  As directed   No heavy lifting for 4 weeks     Resume previous diet    Complete by:  As directed            Discharge Diagnosis:  Peripheral vascular disease with right foot ulcer I70.234  Secondary Diagnosis: Patient Active Problem List   Diagnosis Date Noted  .  Persistent atrial fibrillation ([place])   . Hip fracture, left ([place])   . PVD (peripheral vascular disease) ([place])   . Diabetic ulcer of right foot associated with diabetes mellitus due to underlying condition ([place])   . Closed fracture of left hip ([place]) 09/04/2015  . Pressure ulcer 08/27/2015  . PAD (peripheral artery disease) ([place]) 08/26/2015  . Seizure ([place]) 01/26/2015  . Atrial fibrillation with rapid ventricular response ([place]) 01/26/2015  . Phlebitis   . Screen for STD (sexually transmitted disease)   . Essential thrombocythemia ([place])   . Myelofibrosis ([place])   . UTI (urinary tract infection) 09/08/2014  . Atrial fibrillation with RVR ([place]) 09/07/2014  . Cellulitis of left upper arm 09/07/2014  . Paroxysmal atrial fibrillation ([place]) 04/08/2013  . Essential hypertension 04/08/2013  . CAFL (chronic airflow limitation) ([place]) 05/11/2012  . Essential hemorrhagic thrombocythemia ([place]) 05/11/2012  . Clinical depression 07/13/2011  . Diabetes ([place]) 07/13/2011  . DM2 (diabetes mellitus, type 2) ([place]) 06/19/2009  . Dyslipidemia 06/19/2009  . CORONARY ATHEROSCLEROSIS NATIVE CORONARY ARTERY 06/19/2009   Past Medical History  Diagnosis Date  . Depression   . Essential hypertension, benign   . Hypercholesteremia   . Insulin dependent diabetes mellitus ([place])   . Essential thrombocytosis (HCC)     JAK2 negative  . Coronary atherosclerosis of native coronary artery     Multivessel status post CABG in [place]  . Atrial fibrillation ([place])   . Myelofibrosis ([place])   . PAD (peripheral artery disease) ([place])   . Myocardial infarction ([place])   . Dysrhythmia     A FIB  . Seizure ([place])     NONE IN 15 YEARS  . COPD (chronic obstructive pulmonary disease) (HCC)     USES 2L O2  HS  . Shortness of breath dyspnea     W/ EXERTION   . Tuberculosis     PARENTS BOTH HAD TB   . History of kidney stones        Medication List    TAKE these medications        acetaminophen 500 MG tablet  Commonly  known as:  TYLENOL  Take 1 tablet (500 mg total) by mouth every 6 (six) hours as needed for mild pain.     apixaban 2.5 MG Tabs tablet  Commonly known as:  ELIQUIS  Take 1 tablet (2.5 mg total) by mouth 2 (two) times daily.     aspirin 81 MG tablet  Take 81 mg by mouth daily.     atorvastatin 40 MG tablet  Commonly known as:  LIPITOR  Take 40 mg by mouth every evening.     calcium carbonate 600 MG tablet  Commonly known as:  OS-CAL  Take 1 tablet (600 mg total) by mouth 2 (two) times daily with a meal.     cholecalciferol 1000 units tablet  Commonly known as:  VITAMIN D  Take 1 tablet (1,000 Units total) by mouth daily.     ciprofloxacin 500 MG tablet  Commonly known as:  CIPRO  Take 1 tablet (500 mg total) by mouth 2 (two) times daily.     diltiazem 180 MG 24 hr capsule  Commonly known as:  CARTIA XT  Take 1 capsule (180 mg total) by mouth daily.     docusate sodium 100 MG capsule  Commonly known as:  COLACE  Take 100 mg by mouth 2 (two) times daily.     feeding supplement (PRO-STAT SUGAR FREE 64) Liqd  Take 60 mLs by mouth 3 (three) times daily with meals.     fentaNYL 25 MCG/HR patch  Commonly known as:  DURAGESIC - dosed mcg/hr  Place 25 mcg onto the skin every 3 (three) days.     furosemide 20 MG tablet  Commonly known as:  LASIX  Take 20 mg by mouth daily as needed for fluid.     hydroxyurea 500 MG capsule  Commonly known as:  HYDREA  Take 500 mg by mouth 2 (two) times daily.     MELATIN PO  Take 3 mg by mouth at bedtime.     metoprolol succinate 25 MG 24 hr tablet  Commonly known as:  TOPROL-XL  Take 25 mg by mouth daily.     multivitamin tablet  Take 1 tablet by mouth daily.     NOVOLOG MIX 70/30 FLEXPEN (70-30) 100 UNIT/ML FlexPen  Generic drug:  insulin aspart protamine - aspart  Inject 15 Units into the skin 2 (two) times daily.     oxyCODONE-acetaminophen 5-325 MG tablet  Commonly known as:  PERCOCET/ROXICET  Take 1 tablet by mouth every 4  (four) hours as needed for severe pain.     OXYGEN  Inhale 2 L into the lungs every evening. And night for COPD     PROAIR HFA 108 (90 Base) MCG/ACT inhaler  Generic drug:  albuterol  Inhale 2 puffs into the lungs every 6 (six) hours as needed for wheezing or shortness of breath.     sertraline 50 MG tablet  Commonly known as:  ZOLOFT  Take 50 mg by mouth daily.     traMADol 50 MG tablet  Commonly known as:  ULTRAM  Take 1 tablet (50 mg total) by mouth every 6 (six) hours as needed for moderate pain.     vitamin C 500 MG tablet  Commonly known as:  ASCORBIC ACID  Take 500 mg by mouth 2 (two) times daily.     zinc sulfate 220 (50 Zn) MG capsule  Take 220 mg by mouth daily.        Prescriptions given: 1.  Tramadol #15 No Refill 2.  Cipro 500mg  bid x 6 more days   Instructions: 1.  Wash the groin wound with soap and water daily and pat dry. (No tub bath-only shower)  Then put a dry gauze or washcloth there to keep this area dry daily and as needed.  Do not use Vaseline or neosporin on your incisions.  Only use soap and water on your incisions and then protect and keep dry. 2.  Sacral decubitus care per facility protocol. 3.  Dry dressing to right dorsum foot daily 4.  Float heels while in bed   Disposition: SNF  Patient's condition: is Good  Follow up: 1. Dr. Trula Slade in 2 weeks   Leontine Locket, PA-C [facility] [phone] 10/17/2015  7:50 AM   - For VQI Registry use ---   Post-op:  Wound infection: No  Graft infection: No  Transfusion: No  If yes, n/a units given New Arrhythmia: No Ipsilateral amputation: No, [ ]  Minor, [ ]  BKA, [ ]  AKA Discharge patency: [x ] Primary, [ ]  Primary assisted, [ ]  Secondary, [ ]  Occluded Patency judged by: [x ] Dopper only, [ ]  Palpable graft pulse, []  Palpable distal pulse, [ ]  ABI inc. > 0.15, [ ]  Duplex Discharge ABI: not done R , L not done D/C Ambulatory Status:  Some ambulation with assistance-  working with PT (hip fx 6 weeks ago)  Complications: MI: No, [ ]  Troponin only, [ ]  EKG or Clinical CHF: No Resp failure:No, [ ]  Pneumonia, [ ]  Ventilator Chg in renal function: No, [ ]  Inc. Cr > 0.5, [ ]  Temp. Dialysis, [ ]  Permanent dialysis Stroke: No, [ ]  Minor, [ ]  Major Return to OR: No  Reason for return to OR: [ ]  Bleeding, [ ]  Infection, [ ]  Thrombosis, [ ]  Revision  Discharge medications: Statin use:  yes ASA use:  yes Plavix use:  no Beta blocker use: yes ACEI use:   no ARB use:  no Coumadin use: no Eliquis:  Yes

## 2015-10-17 NOTE — Evaluation (Signed)
Physical Therapy Evaluation Patient Details Name: Theresa Bird MRN: [id number] DOB: 1933-07-30 Today's Date: 10/17/2015   History of Present Illness  Pt is a 82 y/o F s/p Rt femoral to below knee popliteal bypass graft.  Pt's PMH includes Bil heel ulcers, depression, a-fib, myelofibrosis, MI, seizure, COPD, Lt hip fx (per pt).    Clinical Impression  Patient is s/p above surgery resulting in functional limitations due to the deficits listed below (see PT Problem List). Theresa Bird presents w/ Bil LE weakness and pain. She currently requires mod assist for safe transfers and short distance ambulation.  Plan is for pt to d/c back to [facility]. Patient will benefit from skilled PT to increase their independence and safety with mobility to allow discharge to the venue listed below.      Follow Up Recommendations SNF;Supervision for mobility/OOB    Equipment Recommendations  Other (comment) (TBD at next venue of care)    Recommendations for Other Services       Precautions / Restrictions Precautions Precautions: Fall Precaution Comments: in and out of SNF due to recurrent falls Restrictions Weight Bearing Restrictions: No      Mobility  Bed Mobility Overal bed mobility: Needs Assistance Bed Mobility: Supine to Sit;Sit to Supine     Supine to sit: Min guard Sit to supine: Min guard   General bed mobility comments: Increased time and effort, no physical assist needed  Transfers Overall transfer level: Needs assistance Equipment used: Rolling walker (2 wheeled) Transfers: Sit to/from Stand Sit to Stand: Mod assist         General transfer comment: Assist to boost up to standing and cues for hand placement  Ambulation/Gait Ambulation/Gait assistance: Mod assist Ambulation Distance (Feet): 6 Feet Assistive device: Rolling walker (2 wheeled) Gait Pattern/deviations: Step-to pattern;Decreased stride length;Decreased weight shift to right;Antalgic;Trunk flexed    Gait velocity interpretation: <1.8 ft/sec, indicative of risk for recurrent falls General Gait Details: Flexed posture which improves temporarily w/ verbal cues.  Mod assist to steady and to manage RW.  Stairs            Wheelchair Mobility    Modified Rankin (Stroke Patients Only)       Balance Overall balance assessment: Needs assistance;History of Falls Sitting-balance support: Bilateral upper extremity supported;Feet supported Sitting balance-Leahy Scale: Fair Sitting balance - Comments: UEs supported while sitting EOB   Standing balance support: Bilateral upper extremity supported;During functional activity Standing balance-Leahy Scale: Poor Standing balance comment: RW and outside assist for support                             Pertinent Vitals/Pain Pain Assessment: 0-10 Pain Score: 7  Pain Location: Rt groin Pain Descriptors / Indicators: Aching;Constant;Tightness Pain Intervention(s): Limited activity within patient's tolerance;Monitored during session;Repositioned    Home Living Family/patient expects to be discharged to:: Skilled nursing facility                 Additional Comments: [facility].  Originally at [facility] due to fall and rib fx, then was d/c home.  [place] again at home w/ resultant Lt hip fx and sent back to [facility] (~5 wks ago), pt reports she is WBAT on Lt LE.    Prior Function Level of Independence: Needs assistance   Gait / Transfers Assistance Needed: Was working on transfers and was beginning to take a few steps at SNF.    ADL's / [employer]  Needed: Needs assist w/ bathing and dressing        Hand Dominance        Extremity/Trunk Assessment   Upper Extremity Assessment: Defer to OT evaluation           Lower Extremity Assessment: RLE deficits/detail;LLE deficits/detail RLE Deficits / Details: limited ROM and strength as expect s/p Rt LE bypass.  Ulcer heel of Rt foot. LLE Deficits /  Details: Ulcer on heel.  Knee extension 3+/5, DF 3/5  Cervical / Trunk Assessment: Kyphotic  Communication   Communication: No difficulties  Cognition Arousal/Alertness: Awake/alert Behavior During Therapy: WFL for tasks assessed/performed Overall Cognitive Status: Within Functional Limits for tasks assessed                      General Comments      Exercises General Exercises - Lower Extremity Ankle Circles/Pumps: AROM;Both;10 reps;Seated Long Arc Quad: AROM;Both;10 reps;Seated      Assessment/Plan    PT Assessment Patient needs continued PT services  PT Diagnosis Difficulty walking;Generalized weakness;Acute pain   PT Problem List Decreased strength;Decreased range of motion;Decreased activity tolerance;Decreased balance;Decreased mobility;Decreased knowledge of use of DME;Decreased safety awareness;Pain;Decreased skin integrity;Impaired sensation  PT Treatment Interventions DME instruction;Gait training;Functional mobility training;Therapeutic activities;Therapeutic exercise;Balance training;Patient/family education   PT Goals (Current goals can be found in the Care Plan section) Acute Rehab PT Goals Patient Stated Goal: to go back to [facility] PT Goal Formulation: With patient/family Time For Goal Achievement: 10/31/15 Potential to Achieve Goals: Good    Frequency Min 3X/week   Barriers to discharge        Co-evaluation               End of Session Equipment Utilized During Treatment: Gait belt Activity Tolerance: Patient limited by pain;Patient limited by fatigue Patient left: in bed;with call bell/phone within reach;with family/visitor present;Other (comment) (heels floated on pillows) Nurse Communication: Mobility status;Other (comment) (Pt needs prevalon boots Bil)         Time: 1324-1350 PT Time Calculation (min) (ACUTE ONLY): 26 min   Charges:   PT Evaluation $PT Eval Moderate Complexity: 1 Procedure PT Treatments $Therapeutic  Activity: 8-22 mins   PT G Codes:       Collie Siad PT, DPT  Pager: [phone] Phone: [phone] 10/17/2015, 2:12 PM

## 2015-10-17 NOTE — Progress Notes (Addendum)
  Progress Note    10/17/2015 7:34 AM 1 Day Post-Op  Subjective:  "I feel great-my foot is the same color as my leg"  Tm 100.9 now afebrile HR  90's-100's Afib [id number] systolic [id number]  Gtts:  Cardizem is off now on po  Filed Vitals:   10/17/15 0047 10/17/15 0534  BP: 88/49 87/46  Pulse: 104 96  Temp: 100.9 F (38.3 C) 97.9 F (36.6 C)  Resp: 20 13    Physical Exam: Cardiac:  irregular Lungs:  Non labored Incisions:  Clean and dry without hematoma Extremities:  +DP/PT/peroneal doppler signals right foot; wounds on dorsum of foot are dry   CBC    Component Value Date/Time   WBC 13.4* 10/17/2015 0549   RBC 2.71* 10/17/2015 0549   HGB 10.4* 10/17/2015 0549   HCT 33.0* 10/17/2015 0549   PLT 544* 10/17/2015 0549   MCV 121.8* 10/17/2015 0549   MCH 38.4* 10/17/2015 0549   MCHC 31.5 10/17/2015 0549   RDW 18.1* 10/17/2015 0549   LYMPHSABS 1.1 09/04/2015 1400   MONOABS 1.1* 09/04/2015 1400   EOSABS 0.4 09/04/2015 1400   BASOSABS 0.2* 09/04/2015 1400    BMET    Component Value Date/Time   NA 137 10/17/2015 0549   K 4.2 10/17/2015 0549   CL 99* 10/17/2015 0549   CO2 30 10/17/2015 0549   GLUCOSE 138* 10/17/2015 0549   BUN 14 10/17/2015 0549   CREATININE 0.71 10/17/2015 0549   CALCIUM 8.3* 10/17/2015 0549   GFRNONAA >60 10/17/2015 0549   GFRAA >60 10/17/2015 0549    INR    Component Value Date/Time   INR 1.44 10/16/2015 0548     Intake/Output Summary (Last 24 hours) at 10/17/15 0734 Last data filed at 10/17/15 0600  Gross per 24 hour  Intake 3106.08 ml  Output   1285 ml  Net 1821.08 ml     Assessment:  82 y.o. female is s/p:  right femoral to below knee popliteal bypass grafting with 24mm ringed Gortex  1 Day Post-Op  Plan: -pt with patent right leg bypass graft -wounds on dorsum of foot are dry -low grade fever-most likely atelectasis but is being treated for UTI-will continue Abx and get IS to pt's room -acute surgical blood loss  anemia-pt is asymptomatic-- -BP soft this am in the 80's-pt states this is her normal as she was [id number] systolic on admission - says she was very nervous. -no hematoma present-will restart Eliquis this morning -afib-off cardizem gtt now on po-appreciate cardiology's help with management -DVT prophylaxis:  Restart Eliquis today -continue Cipro for 10 day total for UTI given synthetic graft material -DM coordinator recommends 70/30 dose 10 units bid when she is eating -discussed groin wound care and importance of keeping it clean and dry with her and her daughter.  She expresses understanding -continue to float heels in the bed -WOC for sacral decubitus (RN ordered yesterday) -pt resides at [facility] since broken hip-she will return there for temporary stay until ready to go home -increase mobilization-PT consult -transfer to [place], PA-C [facility] [phone] 10/17/2015 7:34 AM  Agree with above.  Incisions clean and healing. Foot warm subjectively better Transfer 2w.  Most likely [place] on Monday Needs to walk. Gilford Rile PT OT  Ruta Hinds, MD [facility] [place] Office: [phone] Pager: [phone]

## 2015-10-17 NOTE — Progress Notes (Signed)
Patient is from [facility]. They are able to take weekend admissions.  Percell Locus Shelisa Fern LCSWA [phone]

## 2015-10-18 ENCOUNTER — Encounter (HOSPITAL_COMMUNITY): Payer: Medicare Other

## 2015-10-18 DIAGNOSIS — I484 Atypical atrial flutter: Secondary | ICD-10-CM

## 2015-10-18 LAB — GLUCOSE, CAPILLARY
GLUCOSE-CAPILLARY: 198 mg/dL — AB (ref 65–99)
GLUCOSE-CAPILLARY: 199 mg/dL — AB (ref 65–99)
GLUCOSE-CAPILLARY: 292 mg/dL — AB (ref 65–99)

## 2015-10-18 MED ORDER — CIPROFLOXACIN HCL 500 MG PO TABS: 500.0000 mg | ORAL_TABLET | Freq: Two times a day (BID) | ORAL | Status: DC

## 2015-10-18 MED ORDER — CIPROFLOXACIN HCL 500 MG PO TABS: 500.0000 mg | ORAL_TABLET | Freq: Two times a day (BID) | ORAL | Status: AC

## 2015-10-18 NOTE — Progress Notes (Signed)
Patient Name: Theresa Bird      SUBJECTIVE:  82 year old woman admitted for femoropopliteal [id number] which was complicated by atrial fibrillation with a rapid ventricular response. Seen in consultation 5/12  tracings consistent with atrial flutter although computer read ECG as sinus rhythm ; She has been maintained on aspirin and apixoban the former presumably for her vascular surgery. Heart rates being controlled with diltiazem and metoprolol History of atrial arrhythmias noted a year ago; Dr. Reymundo Poll notes reviewed. No intention for rhythm control  Plan is for discharge to [facility] today. No specific complaints  Echocardiogram 2013 EF 65%    Past Medical History  Diagnosis Date  . Depression   . Essential hypertension, benign   . Hypercholesteremia   . Insulin dependent diabetes mellitus ([place])   . Essential thrombocytosis (HCC)     JAK2 negative  . Coronary atherosclerosis of native coronary artery     Multivessel status post CABG in [place]  . Atrial fibrillation ([place])   . Myelofibrosis ([place])   . PAD (peripheral artery disease) ([place])   . Myocardial infarction ([place])   . Dysrhythmia     A FIB  . Seizure ([place])     NONE IN 15 YEARS  . COPD (chronic obstructive pulmonary disease) (HCC)     USES 2L O2  HS  . Shortness of breath dyspnea     W/ EXERTION   . Tuberculosis     PARENTS BOTH HAD TB   . History of kidney stones     Scheduled Meds:  Scheduled Meds: . apixaban  2.5 mg Oral BID  . aspirin EC  81 mg Oral Daily  . atorvastatin  40 mg Oral QPM  . calcium carbonate  1,250 mg Oral BID WC  . cholecalciferol  1,000 Units Oral Daily  . ciprofloxacin  500 mg Oral BID  . diltiazem  180 mg Oral Daily  . docusate sodium  100 mg Oral BID  . feeding supplement (PRO-STAT SUGAR FREE 64)  60 mL Oral TID WC  . fentaNYL  25 mcg Transdermal Q72H  . hydroxyurea  500 mg Oral BID  . insulin aspart  0-9 Units Subcutaneous TID WC  . metoprolol succinate  25 mg Oral  Daily  . multivitamin with minerals  1 tablet Oral Daily  . pantoprazole  40 mg Oral Daily  . sertraline  50 mg Oral Daily  . vitamin C  500 mg Oral BID  . zinc sulfate  220 mg Oral Daily   Continuous Infusions:  sodium chloride, acetaminophen, albuterol, alum & mag hydroxide-simeth, bisacodyl, furosemide, guaiFENesin-dextromethorphan, hydrALAZINE, labetalol, metoprolol, morphine injection, ondansetron, oxyCODONE-acetaminophen, phenol, potassium chloride, traMADol    PHYSICAL EXAM Filed Vitals:   10/17/15 0746 10/17/15 1150 10/17/15 1931 10/18/15 0323  BP: 93/56 116/61 97/49 115/56  Pulse: 95 99 74 107  Temp:  98 F (36.7 C) 98.9 F (37.2 C) 98.6 F (37 C)  TempSrc:  Oral Oral Oral  Resp: 16 17 18 18   Height:      Weight:      SpO2: 96% 96% 93% 94%   Well developed and nourished in no acute distress HENT normal Neck supple with JVP-flat Carotids brisk and full without bruits Clear Rapid but regular  rate and rhythm with , no murmurs or gallops Abd-soft with active BS without hepatomegaly No Clubbing cyanosis edema Skin-warm and dry A & Oriented  Grossly normal sensory and motor function   TELEMETRY: Reviewed telemetry pt in  aflutter with 2:1 BLOCK   No intake or output data in the 24 hours ending 10/18/15 1030  LABS: Basic Metabolic Panel:  Recent Labs Lab 10/13/15 1559 10/17/15 0549  NA 138 137  K 4.3 4.2  CL 98* 99*  CO2 28 30  GLUCOSE 140* 138*  BUN 11 14  CREATININE 0.54 0.71  CALCIUM 9.2 8.3*   Cardiac Enzymes: No results for input(s): CKTOTAL, CKMB, CKMBINDEX, TROPONINI in the last 72 hours. CBC:  Recent Labs Lab 10/13/15 1559 10/16/15 0548 10/17/15 0549  WBC 16.6* 16.9* 13.4*  HGB 13.4 12.7 10.4*  HCT 41.1 38.6 33.0*  MCV 119.5* 117.7* 121.8*  PLT 718* 694* 544*   PROTIME:  Recent Labs  10/16/15 0548  LABPROT 17.6*  INR 1.44       ASSESSMENT AND PLAN:  Active Problems:   PAD (peripheral artery disease) (HCC)    Persistent atrial fibrillation (HCC)  With persistently rapid rates, I am concerned about the interval development of tachycardia-induced cardiomyopathy; I appreciate Dr. Karle Starch concerns regarding the inability to accomplish rhythm control if there is interval development of LV dysfunction, we will need to consider AV node ablation and pacing as blood pressure  may preclude ability to accomplish rate controlled medically.  The problem with atrial flutter especially slow atrial flutter is that it is really hard to accomplish rhythm control;    As the patient is anticipating discharge today and has no specific complaints I will forward my thoughts to Dr. Domenic Polite when he is to see her next   Signed, Virl Axe MD  10/18/2015

## 2015-10-18 NOTE — Progress Notes (Signed)
OT Cancellation Note and Discharge  Patient Details Name: Theresa Bird MRN: [id number] DOB: 11-30-1933   Cancelled Treatment:    Reason Eval/Treat Not Completed: Other (comment) Pt is Medicare and current D/C plan is back to SNF. No apparent immediate acute care OT needs, therefore will defer OT to SNF. If OT eval is needed please call Acute Rehab Dept. at [phone] or text page OT at [phone].    Almon Register [id number] 10/18/2015, 1:13 PM

## 2015-10-18 NOTE — Clinical Social Work Note (Signed)
Clinical Social Worker facilitated patient discharge including contacting patient and facility to confirm patient discharge plans.  Clinical information faxed to facility and patient agreeable with plan.  CSW arranged ambulance transport via [facility] to [facility].  Attempted to reach patient daughter to notify of discharge per patient request, however no answer and unable to leave voicemail.  RN to call report prior to discharge.  Clinical Social Worker will sign off for now as social work intervention is no longer needed. Please consult [place] again if new need arises.  Barbette Or, LCSW (Weekend Coverage Only) [phone]

## 2015-10-18 NOTE — Progress Notes (Addendum)
  Progress Note    10/18/2015 8:24 AM 2 Days Post-Op  Subjective:  Still feels good but some foot pain  Afebrile HR  70's-100's Afib [id number] systolic [id number] RA  Filed Vitals:   10/17/15 1931 10/18/15 0323  BP: 97/49 115/56  Pulse: 74 107  Temp: 98.9 F (37.2 C) 98.6 F (37 C)  Resp: 18 18    Physical Exam: Cardiac:  irregular Lungs:  Non labored Incisions:  Both incisions are clean and dry Extremities:  Brisk peroneal/AT   CBC    Component Value Date/Time   WBC 13.4* 10/17/2015 0549   RBC 2.71* 10/17/2015 0549   HGB 10.4* 10/17/2015 0549   HCT 33.0* 10/17/2015 0549   PLT 544* 10/17/2015 0549   MCV 121.8* 10/17/2015 0549   MCH 38.4* 10/17/2015 0549   MCHC 31.5 10/17/2015 0549   RDW 18.1* 10/17/2015 0549   LYMPHSABS 1.1 09/04/2015 1400   MONOABS 1.1* 09/04/2015 1400   EOSABS 0.4 09/04/2015 1400   BASOSABS 0.2* 09/04/2015 1400    BMET    Component Value Date/Time   NA 137 10/17/2015 0549   K 4.2 10/17/2015 0549   CL 99* 10/17/2015 0549   CO2 30 10/17/2015 0549   GLUCOSE 138* 10/17/2015 0549   BUN 14 10/17/2015 0549   CREATININE 0.71 10/17/2015 0549   CALCIUM 8.3* 10/17/2015 0549   GFRNONAA >60 10/17/2015 0549   GFRAA >60 10/17/2015 0549    INR    Component Value Date/Time   INR 1.44 10/16/2015 0548    No intake or output data in the 24 hours ending 10/18/15 0824   Assessment:  82 y.o. female is s/p:  right femoral to below knee popliteal bypass grafting with 22mm ringed Gortex   2 Days Post-Op  Plan: -pt doing well with brisk peroneal and AT signals -stressed to pt again about keeping groin dry and clean  -will discharge back to [facility] today -f/u with Dr. Trula Slade in 2 weeks. -continue PT and rehab -dry dressing to right foot -6 days left of Cipro for UTI given synthetic graft material   Leontine Locket, PA-C [facility] [phone] 10/18/2015 8:24 AM

## 2015-10-19 ENCOUNTER — Telehealth: Payer: Self-pay | Admitting: Surgery

## 2015-10-19 ENCOUNTER — Encounter (HOSPITAL_COMMUNITY): Payer: Self-pay | Admitting: Surgery

## 2015-10-19 NOTE — Op Note (Signed)
Patient name: Theresa Bird MRN: [id number] DOB: 1934/01/01 Sex: female  10/16/2015 Pre-operative Diagnosis: Right leg ulcer Post-operative diagnosis:  Same Surgeon:  Annamarie Major Assistants:  Gerri Lins Procedure:   Right femoral to below-knee popliteal artery bypass graft with 6 mm propatent Anesthesia:  Gen. Blood Loss:  See anesthesia record Specimens:  None  Findings:  Extremely calcified femoral artery requiring endarterectomy.  The below knee popliteal artery was also heavily calcified.  The patient did not have an adequate saphenous vein as it has previously been harvested for CABG  Indications:  The patient has a chronic right lower extremity ulcer which has been unable to heal.  Angiography revealed an occluded superficial femoral artery.  She is here today for surgical revascularization  Procedure:  The patient was identified in the holding area and taken to [facility]  The patient was then placed supine on the table. general anesthesia was administered.  The patient was prepped and draped in the usual sterile fashion.  A time out was called and antibiotics were administered.  An oblique incision was made in the right groin.  Cautery was used to divide subcutaneous tissue down to the femoral sheath which was opened sharply.  I exposed the common femoral artery from the inguinal ligament down to his bifurcation and the profunda and superficial femoral artery were individually isolated.  There was extensive calcified plaque it was easily palpable.  Next, a medial below the knee incision was made.  The fascia was opened and the grasper via's muscle was reflected posteriorly.  The popliteal space was entered.  I ligated the anterior tibial vein to expose the popliteal artery down to the anterior tibial origin.  The anterior tibial artery was isolated and encircled with a vessel loop.  This artery was heavily calcified.  Next, a subsartorial tunnel was created.  The patient was  fully heparinized.  After the heparin circulated, the common femoral profunda femoral and superficial femoral artery were occluded.  A #11 blade was used to make an arteriotomy which was extended longitudinally with Potts scissors.  An endarterectomy of the common femoral profunda femoral arteries was performed.  Once adequate endpoints were obtained, the loose debris was removed.  A 6 mm Gore-Tex graft was brought onto the table.  This was spatulated to fit the size of the arteriotomy which was approximately 2 cm.  A running end-to-side anastomosis was created with CV 6 Gore-Tex suture.  Prior to completion, the appropriate flushing maneuvers were performed and the anastomosis was completed.  Several repair stitches were required for hemostasis.  Next, the graft was brought through the previously created tunnel, making sure to maintain proper orientation.  I then occluded the below-knee popliteal artery with vascular clamps.  Because of the calcification and arteries, I did not think a tourniquet would suffice.  The leg was extended and the graft was cut to the appropriate length.  An arteriotomy in the below knee popliteal artery was made.  Potts scissors were used to extend this longitudinally.  The artery was heavily calcified.  I did not do a endarterectomy, as I did not feel I could achieve a satisfactory end point.  A running anastomosis was created with CV 6 Gore-Tex suture.  Prior to completion, the appropriate flushing maneuvers were performed.  I was able to gently pass a #3 dilator across the distal anastomosis into the below knee popliteal artery beyond where I had occluded it with a vascular clamp.  The anastomosis  was then completed and blood flow was reestablished to the right leg.  The patient had an excellent graft-dependent Doppler signal in the right anterior tibial artery.  I did not shoot an arteriogram.  Next, the heparin was reversed with 50 mg of protamine.  Once hemostasis was satisfactory,  the fascia in the below knee incision was reapproximated with 2-0 Vicryl, followed by 3-0 Vicryl subcutaneous tissue and a 4-0 Vicryl the skin.  In the groin, the femoral sheath was reapproximated with 2-0 Vicryl, followed by 3-0 Vicryl and the subtenon's tissue and 4-0 Vicryl the skin.  Dermabond was applied.  There were no immediate complications.   Disposition:  To PACU in stable condition.   Theotis Burrow, M.D. [facility] [place] Office: [phone] Pager:  [phone]

## 2015-10-19 NOTE — Telephone Encounter (Signed)
-----   Message from Denman George, RN sent at 10/16/2015 11:54 AM EDT ----- Regarding: needs 2 week f/u with Dr. Trula Slade   ----- Message -----    From: Gabriel Earing, PA-C    Sent: 10/16/2015  11:19 AM      To: Vvs Charge Pool  S/p right fem pop 10/16/15.  F/u with Dr. Trula Slade in 2 weeks.  Thanks, Aldona Bar

## 2015-10-19 NOTE — Telephone Encounter (Signed)
Sched appt 5/22 at 3:45. Vm full, mailed appt letter through regular mail to inform pt.

## 2015-10-20 ENCOUNTER — Encounter: Payer: Self-pay | Admitting: Surgery

## 2015-10-26 ENCOUNTER — Encounter: Payer: Self-pay | Admitting: Surgery

## 2015-10-26 ENCOUNTER — Ambulatory Visit (INDEPENDENT_AMBULATORY_CARE_PROVIDER_SITE_OTHER): Payer: Medicare Other | Admitting: Surgery

## 2015-10-26 VITALS — BP 99/64 | HR 111 | Ht 69.0 in | Wt 151.0 lb

## 2015-10-26 DIAGNOSIS — I739 Peripheral vascular disease, unspecified: Secondary | ICD-10-CM

## 2015-10-26 NOTE — Progress Notes (Signed)
POST OPERATIVE OFFICE NOTE    CC:  F/u for surgery  HPI:  This is a 82 y.o. female who is s/p Right fem-po by pass with propaten.  She was started on Cipro for chronic infections prior to surgery.  Sh is here today for a a follow up visit.  She has not been ambulating much because the PT wants our input on activity.  She has been released by Dr. Sharol Given for activity since her hip fracture is now healed.   She reports no health changes and no medication changes since surgery.  No Known Allergies  Current Outpatient Prescriptions  Medication Sig Dispense Refill  . acetaminophen (TYLENOL) 500 MG tablet Take 1 tablet (500 mg total) by mouth every 6 (six) hours as needed for mild pain. 30 tablet 0  . albuterol (PROAIR HFA) 108 (90 BASE) MCG/ACT inhaler Inhale 2 puffs into the lungs every 6 (six) hours as needed for wheezing or shortness of breath.    Marland Kitchen alendronate (FOSAMAX) 35 MG tablet Take by mouth.    . Amino Acids-Protein Hydrolys (FEEDING SUPPLEMENT, PRO-STAT SUGAR FREE 64,) LIQD Take 60 mLs by mouth 3 (three) times daily with meals.    Marland Kitchen apixaban (ELIQUIS) 2.5 MG TABS tablet Take 1 tablet (2.5 mg total) by mouth 2 (two) times daily. 60 tablet 0  . aspirin 81 MG tablet Take 81 mg by mouth daily.    Marland Kitchen atorvastatin (LIPITOR) 40 MG tablet Take 40 mg by mouth every evening.    . calcium carbonate (OS-CAL) 600 MG tablet Take 1 tablet (600 mg total) by mouth 2 (two) times daily with a meal. 60 tablet 0  . cholecalciferol (VITAMIN D) 1000 units tablet Take 1 tablet (1,000 Units total) by mouth daily. 30 tablet 0  . ciprofloxacin (CIPRO) 500 MG tablet Take 500 mg by mouth 2 (two) times daily.    Marland Kitchen diltiazem (CARTIA XT) 180 MG 24 hr capsule Take 1 capsule (180 mg total) by mouth daily. 30 capsule 6  . docusate sodium (COLACE) 100 MG capsule Take 100 mg by mouth 2 (two) times daily.    . fentaNYL (DURAGESIC - DOSED MCG/HR) 25 MCG/HR patch Place 25 mcg onto the skin every 3 (three) days.    .  furosemide (LASIX) 20 MG tablet Take 20 mg by mouth daily as needed for fluid.     . hydroxyurea (HYDREA) 500 MG capsule Take 500 mg by mouth 2 (two) times daily.     . insulin aspart protamine - aspart (NOVOLOG MIX 70/30 FLEXPEN) (70-30) 100 UNIT/ML FlexPen Inject 15 Units into the skin 2 (two) times daily.    . Melatonin-Pyridoxine (MELATIN PO) Take 3 mg by mouth at bedtime.    . metoprolol succinate (TOPROL-XL) 25 MG 24 hr tablet Take 25 mg by mouth daily.    . Multiple Vitamin (MULTIVITAMIN) tablet Take 1 tablet by mouth daily.    Marland Kitchen oxyCODONE-acetaminophen (PERCOCET/ROXICET) 5-325 MG tablet Take 1 tablet by mouth every 4 (four) hours as needed for severe pain. 30 tablet 0  . OXYGEN Inhale 2 L into the lungs every evening. And night for COPD    . sertraline (ZOLOFT) 50 MG tablet Take 50 mg by mouth daily.    . traMADol (ULTRAM) 50 MG tablet Take 1 tablet (50 mg total) by mouth every 6 (six) hours as needed for moderate pain. 15 tablet 0  . vitamin C (ASCORBIC ACID) 500 MG tablet Take 500 mg by mouth 2 (two) times daily.    Marland Kitchen  zinc sulfate 220 (50 Zn) MG capsule Take 220 mg by mouth daily.     No current facility-administered medications for this visit.     ROS:  See HPI  Physical Exam:  Filed Vitals:   10/26/15 1512  BP: 99/64  Pulse: 111    Incision:  Well healed popliteal and groin incisons Extremities:  Right foot with edema and erythema, no change from base line prior to surgery.   Assessment/Plan:  This is a 82 y.o. female who is s/p: RIGHT BYPASS GRAFT FEMORAL-BELOW KNEE POPLITEAL ARTERY USING 6 MM PROPATEN GORTEX GRAFT  We will stop the cipro and start her on Bactrim for 2 weeks. Activity as tolerates with PT.  Elevation when at rest both LE.   She will f/u in 3 weeks.  We will order ABI's and arterial duplex of the right by pass.   Theda Sers EMMA [facility] PA-C [facility] [phone]  Clinic MD:  Pt seen and examined with Dr. Trula Slade   I agree  with the above.  I have seen and evaluated the patient.  She is status post right femoral to above-knee popliteal artery bypass graft on 10/19/2015.  She has a chronic UTI and has been on Cipro.  She called over the weekend and had a ultrasound done which I sent him was for DVT.  We cannot locate this ultrasound.  It was done at the [facility].  She also had her daughter injured her right great toe.  Overall her incisions are healing nicely.  There is some erythema on the dorsum of the right foot.  I'm stopping the Cipro and starting her on Bactrim.  She will follow-up in 3 weeks for a wound check and for duplex of her bypass   Annamarie Major

## 2015-10-30 NOTE — Addendum Note (Signed)
Addended by: Thresa Ross C on: 10/30/2015 12:48 PM   Modules accepted: Orders

## 2015-11-06 ENCOUNTER — Encounter: Payer: Self-pay | Admitting: Surgery

## 2015-11-10 ENCOUNTER — Ambulatory Visit (INDEPENDENT_AMBULATORY_CARE_PROVIDER_SITE_OTHER)
Admission: RE | Admit: 2015-11-10 | Discharge: 2015-11-10 | Disposition: A | Discharge status: Home / Self Care | Payer: Medicare Other | Source: Ambulatory Visit | Attending: Vascular Surgery | Admitting: Vascular Surgery

## 2015-11-10 ENCOUNTER — Ambulatory Visit (HOSPITAL_COMMUNITY)
Admission: RE | Admit: 2015-11-10 | Discharge: 2015-11-10 | Disposition: A | Discharge status: Home / Self Care | Payer: Medicare Other | Source: Ambulatory Visit | Attending: Vascular Surgery | Admitting: Vascular Surgery

## 2015-11-10 DIAGNOSIS — Z95828 Presence of other vascular implants and grafts: Secondary | ICD-10-CM | POA: Diagnosis not present

## 2015-11-10 DIAGNOSIS — I739 Peripheral vascular disease, unspecified: Secondary | ICD-10-CM | POA: Diagnosis present

## 2015-11-16 ENCOUNTER — Ambulatory Visit: Payer: Medicare Other | Admitting: Surgery

## 2015-11-16 ENCOUNTER — Encounter: Payer: Self-pay | Admitting: Surgery

## 2015-11-16 ENCOUNTER — Ambulatory Visit (INDEPENDENT_AMBULATORY_CARE_PROVIDER_SITE_OTHER): Payer: Medicare Other | Admitting: Surgery

## 2015-11-16 VITALS — BP 114/74 | HR 115 | Temp 96.8°F | Resp 20 | Ht 69.0 in | Wt 141.5 lb

## 2015-11-16 DIAGNOSIS — Z48812 Encounter for surgical aftercare following surgery on the circulatory system: Secondary | ICD-10-CM | POA: Insufficient documentation

## 2015-11-16 DIAGNOSIS — I70234 Atherosclerosis of native arteries of right leg with ulceration of heel and midfoot: Secondary | ICD-10-CM

## 2015-11-16 NOTE — Progress Notes (Signed)
Patient name: Theresa Bird MRN: [id number] DOB: February 05, 1934 Sex: female  REASON FOR VISIT: Postop  HPI: Theresa Bird is a 82 y.o. female who is status post right femoral to below-knee popliteal artery bypass graft with Gore-Tex on 10/16/2015.  This was done for a wound.  She has no complaints today.  She had fallen and gotten a new wound on the dorsum of the right foot.  This is looking better  Current Outpatient Prescriptions  Medication Sig Dispense Refill  . acetaminophen (TYLENOL) 500 MG tablet Take 1 tablet (500 mg total) by mouth every 6 (six) hours as needed for mild pain. 30 tablet 0  . albuterol (PROAIR HFA) 108 (90 BASE) MCG/ACT inhaler Inhale 2 puffs into the lungs every 6 (six) hours as needed for wheezing or shortness of breath.    Marland Kitchen alendronate (FOSAMAX) 35 MG tablet Take by mouth.    . Amino Acids-Protein Hydrolys (FEEDING SUPPLEMENT, PRO-STAT SUGAR FREE 64,) LIQD Take 60 mLs by mouth 3 (three) times daily with meals.    Marland Kitchen apixaban (ELIQUIS) 2.5 MG TABS tablet Take 1 tablet (2.5 mg total) by mouth 2 (two) times daily. 60 tablet 0  . aspirin 81 MG tablet Take 81 mg by mouth daily.    Marland Kitchen atorvastatin (LIPITOR) 40 MG tablet Take 40 mg by mouth every evening.    . calcium carbonate (OS-CAL) 600 MG tablet Take 1 tablet (600 mg total) by mouth 2 (two) times daily with a meal. 60 tablet 0  . cholecalciferol (VITAMIN D) 1000 units tablet Take 1 tablet (1,000 Units total) by mouth daily. 30 tablet 0  . diltiazem (CARTIA XT) 180 MG 24 hr capsule Take 1 capsule (180 mg total) by mouth daily. 30 capsule 6  . docusate sodium (COLACE) 100 MG capsule Take 100 mg by mouth 2 (two) times daily.    . fentaNYL (DURAGESIC - DOSED MCG/HR) 25 MCG/HR patch Place 25 mcg onto the skin every 3 (three) days.    . furosemide (LASIX) 20 MG tablet Take 20 mg by mouth daily as needed for fluid.     . hydroxyurea (HYDREA) 500 MG capsule Take 500 mg by mouth 2 (two) times  daily.     . insulin aspart protamine - aspart (NOVOLOG MIX 70/30 FLEXPEN) (70-30) 100 UNIT/ML FlexPen Inject 15 Units into the skin 2 (two) times daily.    . Melatonin-Pyridoxine (MELATIN PO) Take 3 mg by mouth at bedtime.    . metoprolol succinate (TOPROL-XL) 25 MG 24 hr tablet Take 25 mg by mouth daily.    . Multiple Vitamin (MULTIVITAMIN) tablet Take 1 tablet by mouth daily.    Marland Kitchen oxyCODONE-acetaminophen (PERCOCET/ROXICET) 5-325 MG tablet Take 1 tablet by mouth every 4 (four) hours as needed for severe pain. 30 tablet 0  . OXYGEN Inhale 2 L into the lungs every evening. And night for COPD    . sertraline (ZOLOFT) 50 MG tablet Take 50 mg by mouth daily.    . traMADol (ULTRAM) 50 MG tablet Take 1 tablet (50 mg total) by mouth every 6 (six) hours as needed for moderate pain. 15 tablet 0  . vitamin C (ASCORBIC ACID) 500 MG tablet Take 500 mg by mouth 2 (two) times daily.    Marland Kitchen zinc sulfate 220 (50 Zn) MG capsule Take 220 mg by mouth daily.    . ciprofloxacin (CIPRO) 500 MG tablet Take 500 mg by mouth 2 (two) times daily. Reported on 11/16/2015     No current  facility-administered medications for this visit.    REVIEW OF SYSTEMS:  [X]  denotes positive finding, [ ]  denotes negative finding Cardiac  Comments:  Chest pain or chest pressure:    Shortness of breath upon exertion:    Short of breath when lying flat:    Irregular heart rhythm:    Constitutional    Fever or chills:      PHYSICAL EXAM: Filed Vitals:   11/16/15 1258  BP: 114/74  Pulse: 115  Temp: 96.8 F (36 C)  TempSrc: Oral  Resp: 20  Height: 5\' 9"  (1.753 m)  Weight: 141 lb 8 oz (64.184 kg)    GENERAL: The patient is a well-nourished female, in no acute distress. The vital signs are documented above. CARDIOVASCULAR: There is a regular rate and rhythm. PULMONARY: There is good air exchange bilaterally without wheezing or rales. Significant improvement in the wound on the dorsum of the right foot.  Chronic ulcerations  remain stable on the left heel and right lateral foot  Duplex today shows the bypass graft is widely patent  MEDICAL ISSUES: Status post right femoral-popliteal bypass graft for a chronic right leg ulcer.  Her ulcers have remained stable.  The abrasion on the dorsum of her foot has nearly healed.  She is scheduled to be released from the [facility] at the end of the month.  I'm giving her plugged in with the [facility] wound center   She will follow up in 3 months with a duplex of her bypass graft Annamarie Major, MD [facility] [phone] Pager [phone]

## 2015-11-19 ENCOUNTER — Telehealth: Payer: Self-pay | Admitting: Family

## 2015-11-19 NOTE — Telephone Encounter (Signed)
[facility] called to give me patient's appointment. 12/03/15 at 10 am.

## 2015-12-09 ENCOUNTER — Encounter (HOSPITAL_COMMUNITY): Payer: Self-pay | Admitting: Emergency Medicine

## 2015-12-09 ENCOUNTER — Emergency Department (HOSPITAL_COMMUNITY): Payer: Medicare Other

## 2015-12-09 ENCOUNTER — Inpatient Hospital Stay (HOSPITAL_COMMUNITY)
Admission: EM | Admit: 2015-12-09 | Discharge: 2015-12-14 | DRG: 309 | Disposition: A | Discharge status: Skilled Nursing Facility | Payer: Medicare Other | Attending: Internal Medicine | Admitting: Internal Medicine

## 2015-12-09 DIAGNOSIS — I481 Persistent atrial fibrillation: Principal | ICD-10-CM | POA: Diagnosis present

## 2015-12-09 DIAGNOSIS — E7800 Pure hypercholesterolemia, unspecified: Secondary | ICD-10-CM | POA: Diagnosis present

## 2015-12-09 DIAGNOSIS — I252 Old myocardial infarction: Secondary | ICD-10-CM

## 2015-12-09 DIAGNOSIS — J449 Chronic obstructive pulmonary disease, unspecified: Secondary | ICD-10-CM | POA: Diagnosis present

## 2015-12-09 DIAGNOSIS — Z7982 Long term (current) use of aspirin: Secondary | ICD-10-CM

## 2015-12-09 DIAGNOSIS — W1812XA Fall from or off toilet with subsequent striking against object, initial encounter: Secondary | ICD-10-CM | POA: Diagnosis present

## 2015-12-09 DIAGNOSIS — E440 Moderate protein-calorie malnutrition: Secondary | ICD-10-CM | POA: Diagnosis present

## 2015-12-09 DIAGNOSIS — E1151 Type 2 diabetes mellitus with diabetic peripheral angiopathy without gangrene: Secondary | ICD-10-CM | POA: Diagnosis present

## 2015-12-09 DIAGNOSIS — F329 Major depressive disorder, single episode, unspecified: Secondary | ICD-10-CM | POA: Diagnosis present

## 2015-12-09 DIAGNOSIS — I480 Paroxysmal atrial fibrillation: Secondary | ICD-10-CM | POA: Diagnosis present

## 2015-12-09 DIAGNOSIS — D473 Essential (hemorrhagic) thrombocythemia: Secondary | ICD-10-CM | POA: Diagnosis present

## 2015-12-09 DIAGNOSIS — I4891 Unspecified atrial fibrillation: Secondary | ICD-10-CM | POA: Diagnosis present

## 2015-12-09 DIAGNOSIS — I739 Peripheral vascular disease, unspecified: Secondary | ICD-10-CM | POA: Diagnosis present

## 2015-12-09 DIAGNOSIS — F419 Anxiety disorder, unspecified: Secondary | ICD-10-CM | POA: Diagnosis present

## 2015-12-09 DIAGNOSIS — M549 Dorsalgia, unspecified: Secondary | ICD-10-CM | POA: Diagnosis present

## 2015-12-09 DIAGNOSIS — W19XXXA Unspecified fall, initial encounter: Secondary | ICD-10-CM

## 2015-12-09 DIAGNOSIS — R079 Chest pain, unspecified: Secondary | ICD-10-CM | POA: Diagnosis not present

## 2015-12-09 DIAGNOSIS — Z951 Presence of aortocoronary bypass graft: Secondary | ICD-10-CM

## 2015-12-09 DIAGNOSIS — Z7901 Long term (current) use of anticoagulants: Secondary | ICD-10-CM

## 2015-12-09 DIAGNOSIS — Z8781 Personal history of (healed) traumatic fracture: Secondary | ICD-10-CM

## 2015-12-09 DIAGNOSIS — R569 Unspecified convulsions: Secondary | ICD-10-CM

## 2015-12-09 DIAGNOSIS — E119 Type 2 diabetes mellitus without complications: Secondary | ICD-10-CM

## 2015-12-09 DIAGNOSIS — L8990 Pressure ulcer of unspecified site, unspecified stage: Secondary | ICD-10-CM | POA: Diagnosis present

## 2015-12-09 DIAGNOSIS — E785 Hyperlipidemia, unspecified: Secondary | ICD-10-CM | POA: Diagnosis present

## 2015-12-09 DIAGNOSIS — S73102A Unspecified sprain of left hip, initial encounter: Secondary | ICD-10-CM | POA: Diagnosis present

## 2015-12-09 DIAGNOSIS — S43402A Unspecified sprain of left shoulder joint, initial encounter: Secondary | ICD-10-CM | POA: Diagnosis present

## 2015-12-09 DIAGNOSIS — I10 Essential (primary) hypertension: Secondary | ICD-10-CM | POA: Diagnosis present

## 2015-12-09 DIAGNOSIS — I4819 Other persistent atrial fibrillation: Secondary | ICD-10-CM | POA: Diagnosis present

## 2015-12-09 DIAGNOSIS — Z66 Do not resuscitate: Secondary | ICD-10-CM | POA: Diagnosis present

## 2015-12-09 DIAGNOSIS — Y92009 Unspecified place in unspecified non-institutional (private) residence as the place of occurrence of the external cause: Secondary | ICD-10-CM

## 2015-12-09 DIAGNOSIS — Z6821 Body mass index (BMI) 21.0-21.9, adult: Secondary | ICD-10-CM

## 2015-12-09 DIAGNOSIS — S32502A Unspecified fracture of left pubis, initial encounter for closed fracture: Secondary | ICD-10-CM | POA: Diagnosis present

## 2015-12-09 DIAGNOSIS — Z794 Long term (current) use of insulin: Secondary | ICD-10-CM

## 2015-12-09 DIAGNOSIS — Z955 Presence of coronary angioplasty implant and graft: Secondary | ICD-10-CM

## 2015-12-09 DIAGNOSIS — I2510 Atherosclerotic heart disease of native coronary artery without angina pectoris: Secondary | ICD-10-CM | POA: Diagnosis present

## 2015-12-09 LAB — BASIC METABOLIC PANEL
Anion gap: 7 (ref 5–15)
BUN: 12 mg/dL (ref 6–20)
CHLORIDE: 89 mmol/L — AB (ref 101–111)
CO2: 39 mmol/L — AB (ref 22–32)
CREATININE: 0.65 mg/dL (ref 0.44–1.00)
Calcium: 8.5 mg/dL — ABNORMAL LOW (ref 8.9–10.3)
GFR calc Af Amer: >60 mL/min (ref >60)
GFR calc non Af Amer: >60 mL/min (ref >60)
GLUCOSE: 76 mg/dL (ref 65–99)
POTASSIUM: 3.6 mmol/L (ref 3.5–5.1)
SODIUM: 135 mmol/L (ref 135–145)

## 2015-12-09 LAB — TSH: TSH: 1.49 u[IU]/mL (ref 0.350–4.500)

## 2015-12-09 LAB — CBC WITH DIFFERENTIAL/PLATELET
Basophils Absolute: 0.1 10*3/uL (ref 0.0–0.1)
Basophils Relative: 1 %
Eosinophils Absolute: 0.2 10*3/uL (ref 0.0–0.7)
Eosinophils Relative: 1 %
HEMATOCRIT: 37.4 % (ref 36.0–46.0)
HEMOGLOBIN: 11.9 g/dL — AB (ref 12.0–15.0)
LYMPHS ABS: 1 10*3/uL (ref 0.7–4.0)
LYMPHS PCT: 7 %
MCH: 38.5 pg — AB (ref 26.0–34.0)
MCHC: 31.8 g/dL (ref 30.0–36.0)
MCV: 121 fL — AB (ref 78.0–100.0)
MONOS PCT: 5 %
Monocytes Absolute: 0.8 10*3/uL (ref 0.1–1.0)
NEUTROS ABS: 13 10*3/uL — AB (ref 1.7–7.7)
NEUTROS PCT: 86 %
Platelets: 604 10*3/uL — ABNORMAL HIGH (ref 150–400)
RBC: 3.09 MIL/uL — AB (ref 3.87–5.11)
RDW: 15 % (ref 11.5–15.5)
WBC: 15.1 10*3/uL — AB (ref 4.0–10.5)

## 2015-12-09 LAB — GLUCOSE, CAPILLARY
Glucose-Capillary: 125 mg/dL — ABNORMAL HIGH (ref 65–99)
Glucose-Capillary: 350 mg/dL — ABNORMAL HIGH (ref 65–99)

## 2015-12-09 LAB — MRSA PCR SCREENING: MRSA BY PCR: NEGATIVE

## 2015-12-09 MED ORDER — VITAMIN D 1000 UNITS PO TABS
1000.0000 [IU] | ORAL_TABLET | Freq: Every day | ORAL | Status: DC
  Administered 2015-12-10 – 2015-12-14 (×5): 1000 [IU] via ORAL
  Filled 2015-12-09 (×5): qty 1

## 2015-12-09 MED ORDER — ENSURE ENLIVE PO LIQD: 237.0000 mL | Freq: Two times a day (BID) | ORAL | Status: DC

## 2015-12-09 MED ORDER — ATORVASTATIN CALCIUM 40 MG PO TABS
40.0000 mg | ORAL_TABLET | Freq: Every evening | ORAL | Status: DC
  Administered 2015-12-09 – 2015-12-13 (×5): 40 mg via ORAL
  Filled 2015-12-09 (×5): qty 1

## 2015-12-09 MED ORDER — FENTANYL CITRATE (PF) 100 MCG/2ML IJ SOLN
50.0000 ug | Freq: Once | INTRAMUSCULAR | Status: AC
  Administered 2015-12-09: 50 ug via INTRAVENOUS
  Filled 2015-12-09: qty 2

## 2015-12-09 MED ORDER — BISACODYL 5 MG PO TBEC: 10.0000 mg | DELAYED_RELEASE_TABLET | Freq: Every day | ORAL | Status: DC | PRN

## 2015-12-09 MED ORDER — ONDANSETRON HCL 4 MG PO TABS: 4.0000 mg | ORAL_TABLET | Freq: Four times a day (QID) | ORAL | Status: DC | PRN

## 2015-12-09 MED ORDER — DOCUSATE SODIUM 100 MG PO CAPS
100.0000 mg | ORAL_CAPSULE | Freq: Two times a day (BID) | ORAL | Status: DC
  Administered 2015-12-09 – 2015-12-14 (×10): 100 mg via ORAL
  Filled 2015-12-09 (×9): qty 1

## 2015-12-09 MED ORDER — ALBUTEROL SULFATE (2.5 MG/3ML) 0.083% IN NEBU: 2.5000 mg | INHALATION_SOLUTION | Freq: Four times a day (QID) | RESPIRATORY_TRACT | Status: DC | PRN

## 2015-12-09 MED ORDER — COLLAGENASE 250 UNIT/GM EX OINT
1.0000 "application " | TOPICAL_OINTMENT | Freq: Every day | CUTANEOUS | Status: DC
  Administered 2015-12-09 – 2015-12-14 (×5): 1 via TOPICAL
  Filled 2015-12-09 (×2): qty 30

## 2015-12-09 MED ORDER — SODIUM CHLORIDE 0.9% FLUSH
3.0000 mL | Freq: Two times a day (BID) | INTRAVENOUS | Status: DC
  Administered 2015-12-10 – 2015-12-14 (×7): 3 mL via INTRAVENOUS

## 2015-12-09 MED ORDER — ADULT MULTIVITAMIN W/MINERALS CH
1.0000 | ORAL_TABLET | Freq: Every day | ORAL | Status: DC
  Administered 2015-12-10 – 2015-12-14 (×5): 1 via ORAL
  Filled 2015-12-09 (×5): qty 1

## 2015-12-09 MED ORDER — ASPIRIN EC 81 MG PO TBEC
81.0000 mg | DELAYED_RELEASE_TABLET | Freq: Every day | ORAL | Status: DC
  Administered 2015-12-10 – 2015-12-14 (×5): 81 mg via ORAL
  Filled 2015-12-09 (×5): qty 1

## 2015-12-09 MED ORDER — DEXTROSE-NACL 5-0.9 % IV SOLN
INTRAVENOUS | Status: DC
  Administered 2015-12-09: 16:00:00 via INTRAVENOUS

## 2015-12-09 MED ORDER — METOPROLOL SUCCINATE ER 25 MG PO TB24
25.0000 mg | ORAL_TABLET | Freq: Every day | ORAL | Status: DC
  Administered 2015-12-10 – 2015-12-14 (×5): 25 mg via ORAL
  Filled 2015-12-09 (×7): qty 1

## 2015-12-09 MED ORDER — FENTANYL 25 MCG/HR TD PT72
25.0000 ug | MEDICATED_PATCH | TRANSDERMAL | Status: DC
  Administered 2015-12-09 – 2015-12-12 (×2): 25 ug via TRANSDERMAL
  Filled 2015-12-09 (×2): qty 1

## 2015-12-09 MED ORDER — ALBUTEROL SULFATE HFA 108 (90 BASE) MCG/ACT IN AERS: 2.0000 | INHALATION_SPRAY | Freq: Four times a day (QID) | RESPIRATORY_TRACT | Status: DC | PRN

## 2015-12-09 MED ORDER — APIXABAN 2.5 MG PO TABS
2.5000 mg | ORAL_TABLET | Freq: Two times a day (BID) | ORAL | Status: DC
  Administered 2015-12-09 – 2015-12-14 (×11): 2.5 mg via ORAL
  Filled 2015-12-09 (×19): qty 1

## 2015-12-09 MED ORDER — SERTRALINE HCL 50 MG PO TABS
50.0000 mg | ORAL_TABLET | Freq: Every day | ORAL | Status: DC
  Administered 2015-12-10 – 2015-12-14 (×5): 50 mg via ORAL
  Filled 2015-12-09 (×5): qty 1

## 2015-12-09 MED ORDER — CALCIUM CARBONATE 1250 (500 CA) MG PO TABS
1.0000 | ORAL_TABLET | Freq: Two times a day (BID) | ORAL | Status: DC
  Administered 2015-12-09 – 2015-12-14 (×10): 500 mg via ORAL
  Filled 2015-12-09 (×10): qty 1

## 2015-12-09 MED ORDER — ONDANSETRON HCL 4 MG/2ML IJ SOLN: 4.0000 mg | Freq: Four times a day (QID) | INTRAMUSCULAR | Status: DC | PRN

## 2015-12-09 MED ORDER — INSULIN ASPART 100 UNIT/ML ~~LOC~~ SOLN
0.0000 [IU] | Freq: Every day | SUBCUTANEOUS | Status: DC
  Administered 2015-12-09: 4 [IU] via SUBCUTANEOUS
  Administered 2015-12-10 – 2015-12-13 (×4): 2 [IU] via SUBCUTANEOUS

## 2015-12-09 MED ORDER — DILTIAZEM LOAD VIA INFUSION
10.0000 mg | Freq: Once | INTRAVENOUS | Status: AC
  Administered 2015-12-09: 10 mg via INTRAVENOUS
  Filled 2015-12-09: qty 10

## 2015-12-09 MED ORDER — VITAMIN C 500 MG PO TABS
500.0000 mg | ORAL_TABLET | Freq: Two times a day (BID) | ORAL | Status: DC
  Administered 2015-12-09 – 2015-12-14 (×10): 500 mg via ORAL
  Filled 2015-12-09 (×10): qty 1

## 2015-12-09 MED ORDER — DILTIAZEM HCL 100 MG IV SOLR
5.0000 mg/h | INTRAVENOUS | Status: DC
  Administered 2015-12-09: 5 mg/h via INTRAVENOUS
  Administered 2015-12-09: 15 mg/h via INTRAVENOUS
  Administered 2015-12-10: 10 mg/h via INTRAVENOUS
  Administered 2015-12-10: 15 mg/h via INTRAVENOUS
  Filled 2015-12-09 (×5): qty 100

## 2015-12-09 MED ORDER — HYDROXYUREA 500 MG PO CAPS
500.0000 mg | ORAL_CAPSULE | Freq: Two times a day (BID) | ORAL | Status: DC
  Administered 2015-12-09 – 2015-12-14 (×10): 500 mg via ORAL
  Filled 2015-12-09 (×10): qty 1

## 2015-12-09 MED ORDER — INSULIN ASPART 100 UNIT/ML ~~LOC~~ SOLN
0.0000 [IU] | Freq: Three times a day (TID) | SUBCUTANEOUS | Status: DC
  Administered 2015-12-10: 3 [IU] via SUBCUTANEOUS
  Administered 2015-12-10: 2 [IU] via SUBCUTANEOUS
  Administered 2015-12-10: 8 [IU] via SUBCUTANEOUS
  Administered 2015-12-11: 3 [IU] via SUBCUTANEOUS
  Administered 2015-12-11 (×2): 5 [IU] via SUBCUTANEOUS
  Administered 2015-12-12 (×3): 3 [IU] via SUBCUTANEOUS
  Administered 2015-12-13: 15 [IU] via SUBCUTANEOUS
  Administered 2015-12-13: 3 [IU] via SUBCUTANEOUS
  Administered 2015-12-14: 11 [IU] via SUBCUTANEOUS
  Administered 2015-12-14: 3 [IU] via SUBCUTANEOUS

## 2015-12-09 NOTE — Progress Notes (Signed)
Pt has multiple wounds present on admission.  Pt has a sacral 2cm x 3cm stage 3 wound.  She has a stage 3 wound on her left heel 2cm x 2.5 cm.  She has a healing stage 3 on her right lateral foot 1.5cm x 1.5cm.  The right lateral foot also has a 1cm x 1cm healing stage 3.

## 2015-12-09 NOTE — ED Notes (Signed)
Continue to await Eliquis from pharmacy

## 2015-12-09 NOTE — H&P (Signed)
[facility] History and Physical  Theresa Bird [id number] DOB: March 27, 1934    PCP:   Neale Burly, MD   Chief Complaint: retrosternal chest pain radiating to neck.   HPI: Theresa Bird is an 82 y.o. female with hx of depression, anxiety, afib on Eliquis, hx of known CAD with several cardiac stents, last placed about 2 years ago, hx of DM, ET, COPD, myelofibrosis, presented to the ER after falling.   She has no SOB, exertional CP, fever, chills or coughs.  In the ER, she was found to have EKG with rate control afib, no ischemic or injurious changes, Xrays of the shoulder, LS spine, and pelvic films were all negative.  She was started on IV Cardiazem, and hospitalist was asked to admit her for afib with RVR.    Rewiew of Systems:  Constitutional: Negative for malaise, fever and chills. No significant weight loss or weight gain Eyes: Negative for eye pain, redness and discharge, diplopia, visual changes, or flashes of light. ENMT: Negative for ear pain, hoarseness, nasal congestion, sinus pressure and sore throat. No headaches; tinnitus, drooling, or problem swallowing. Cardiovascular: Negative for chest pain, palpitations, diaphoresis, dyspnea and peripheral edema. ; No orthopnea, PND Respiratory: Negative for cough, hemoptysis, wheezing and stridor. No pleuritic chestpain. Gastrointestinal: Negative for nausea, vomiting, diarrhea, constipation, abdominal pain, melena, blood in stool, hematemesis, jaundice and rectal bleeding.    Genitourinary: Negative for frequency, dysuria, incontinence,flank pain and hematuria; Musculoskeletal:  Negative for swelling and trauma.; right shoulder discomfort and high upper back pain.  Skin: . Negative for pruritus, rash, abrasions, bruising and skin lesion.; ulcerations Neuro: Negative for headache, lightheadedness and neck stiffness. Negative for weakness, altered level of consciousness , altered mental status, extremity weakness, burning feet,  involuntary movement, seizure and syncope.  Psych: negative for , insomnia, tearfulness, panic attacks, hallucinations, paranoia, suicidal or homicidal ideation    Past Medical History  Diagnosis Date  . Depression   . Essential hypertension, benign   . Hypercholesteremia   . Insulin dependent diabetes mellitus ([place])   . Essential thrombocytosis (HCC)     JAK2 negative  . Coronary atherosclerosis of native coronary artery     Multivessel status post CABG in [place]  . Atrial fibrillation ([place])   . Myelofibrosis ([place])   . PAD (peripheral artery disease) ([place])   . Myocardial infarction ([place])   . Dysrhythmia     A FIB  . Seizure ([place])     NONE IN 15 YEARS  . COPD (chronic obstructive pulmonary disease) (HCC)     USES 2L O2  HS  . Shortness of breath dyspnea     W/ EXERTION   . Tuberculosis     PARENTS BOTH HAD TB   . History of kidney stones     Past Surgical History  Procedure Laterality Date  . Abdominal hysterectomy    . Cataract extraction    . Coronary artery bypass graft      Possibly 2002 in [place]  . Cardioversion N/A 09/09/2014    Procedure: CARDIOVERSION;  Surgeon: Josue Hector, MD;  Location[facility] ENDOSCOPY;  Service: Cardiovascular;  Laterality: N/A;  . Cardioversion N/A 10/03/2014    Procedure: CARDIOVERSION;  Surgeon: Arnoldo Lenis, MD;  Location: [facility];  Service: Endoscopy;  Laterality: N/A;  . Peripheral vascular catheterization N/A 08/27/2015    Procedure: Abdominal Aortogram w/Lower Extremity;  Surgeon: Conrad [place], MD;  Location: [facility];  Service: Cardiovascular;  Laterality: N/A;  .  Intramedullary (im) nail intertrochanteric Left 09/05/2015    Procedure: INTRAMEDULLARY (IM) NAIL INTERTROCHANTRIC;  Surgeon: Newt Minion, MD;  Location: [facility];  Service: Orthopedics;  Laterality: Left;  . Femoral-popliteal bypass graft Right 10/16/2015    Procedure: RIGHT BYPASS GRAFT FEMORAL-BELOW KNEE POPLITEAL ARTERY USING 6 MM PROPATEN GORTEX GRAFT;   Surgeon: Serafina Mitchell, MD;  Location: [facility];  Service: Vascular;  Laterality: Right;    Medications:  HOME MEDS: Prior to Admission medications   Medication Sig Start Date End Date Taking? Authorizing Provider  acetaminophen (TYLENOL) 500 MG tablet Take 1 tablet (500 mg total) by mouth every 6 (six) hours as needed for mild pain. 09/05/15  Yes Newt Minion, MD  albuterol (PROAIR HFA) 108 (90 BASE) MCG/ACT inhaler Inhale 2 puffs into the lungs every 6 (six) hours as needed for wheezing or shortness of breath.   Yes Historical Provider, MD  aspirin 81 MG tablet Take 81 mg by mouth daily.   Yes Historical Provider, MD  atorvastatin (LIPITOR) 40 MG tablet Take 40 mg by mouth every evening.   Yes Historical Provider, MD  bisacodyl (DULCOLAX) 5 MG EC tablet Take 10 mg by mouth daily as needed for moderate constipation.   Yes Historical Provider, MD  calcium carbonate (OS-CAL) 600 MG tablet Take 1 tablet (600 mg total) by mouth 2 (two) times daily with a meal. 09/07/15  Yes Shela Leff, MD  cholecalciferol (VITAMIN D) 1000 units tablet Take 1 tablet (1,000 Units total) by mouth daily. 09/07/15  Yes Shela Leff, MD  collagenase (SANTYL) ointment Apply 1 application topically daily. Apply to left heel topically every day shift.   Yes Historical Provider, MD  diltiazem (CARTIA XT) 180 MG 24 hr capsule Take 1 capsule (180 mg total) by mouth daily. 09/14/15  Yes Satira Sark, MD  docusate sodium (COLACE) 100 MG capsule Take 100 mg by mouth 2 (two) times daily.   Yes Historical Provider, MD  fentaNYL (DURAGESIC - DOSED MCG/HR) 25 MCG/HR patch Place 25 mcg onto the skin every 3 (three) days.   Yes Historical Provider, MD  furosemide (LASIX) 20 MG tablet Take 20 mg by mouth daily as needed for fluid.    Yes Historical Provider, MD  hydroxyurea (HYDREA) 500 MG capsule Take 500 mg by mouth 2 (two) times daily.  04/14/14  Yes Historical Provider, MD  insulin aspart protamine - aspart (NOVOLOG MIX  70/30 FLEXPEN) (70-30) 100 UNIT/ML FlexPen Inject 15 Units into the skin 2 (two) times daily.   Yes Historical Provider, MD  Melatonin-Pyridoxine (MELATIN PO) Take 3 mg by mouth at bedtime.   Yes Historical Provider, MD  metoprolol succinate (TOPROL-XL) 25 MG 24 hr tablet Take 25 mg by mouth daily.   Yes Historical Provider, MD  Multiple Vitamin (MULTIVITAMIN) tablet Take 1 tablet by mouth daily.   Yes Historical Provider, MD  ondansetron (ZOFRAN) 4 MG tablet Take 4 mg by mouth every 6 (six) hours as needed for nausea or vomiting.   Yes Historical Provider, MD  oxyCODONE-acetaminophen (PERCOCET/ROXICET) 5-325 MG tablet Take 1 tablet by mouth every 4 (four) hours as needed for severe pain. Patient taking differently: Take 1 tablet by mouth every 6 (six) hours as needed for moderate pain or severe pain.  08/27/15  Yes Samantha J Rhyne, PA-C  sertraline (ZOLOFT) 50 MG tablet Take 50 mg by mouth daily.   Yes Historical Provider, MD  traMADol (ULTRAM) 50 MG tablet Take 1 tablet (50 mg total) by mouth every  6 (six) hours as needed for moderate pain. 10/17/15  Yes Samantha J Rhyne, PA-C  vitamin C (ASCORBIC ACID) 500 MG tablet Take 500 mg by mouth 2 (two) times daily.   Yes Historical Provider, MD  apixaban (ELIQUIS) 2.5 MG TABS tablet Take 1 tablet (2.5 mg total) by mouth 2 (two) times daily. Patient not taking: Reported on 12/09/2015 09/07/15   Shela Leff, MD  ciprofloxacin (CIPRO) 500 MG tablet Take 500 mg by mouth 2 (two) times daily. Reported on 12/09/2015    Historical Provider, MD  OXYGEN Inhale 2 L into the lungs every evening. And night for COPD    Historical Provider, MD     Allergies:  No Known Allergies  Social History:   reports that she has never smoked. She has never used smokeless tobacco. She reports that she drinks alcohol. She reports that she does not use illicit drugs.  Family History: Family History  Problem Relation Age of Onset  . Coronary artery disease Mother   . COPD  Other      Physical Exam: Filed Vitals:   12/09/15 1315 12/09/15 1330 12/09/15 1439 12/09/15 1512  BP:  102/71 102/69   Pulse: 140 139 140   Temp:    97.3 F (36.3 C)  TempSrc:    Oral  Resp: 16 16 16    Height:      Weight:      SpO2: 100% 98% 98%    Blood pressure 102/69, pulse 140, temperature 97.3 F (36.3 C), temperature source Oral, resp. rate 16, height 5\' 9"  (1.753 m), weight 65.318 kg (144 lb), SpO2 98 %.  GEN:  Pleasant patient lying in the stretcher in no acute distress; cooperative with exam. PSYCH:  alert and oriented x4; does not appear anxious or depressed; affect is appropriate. HEENT: Mucous membranes pink and anicteric; PERRLA; EOM intact; no cervical lymphadenopathy nor thyromegaly or carotid bruit; no JVD; There were no stridor. Neck is very supple. Breasts:: Not examined CHEST WALL: No tenderness CHEST: Normal respiration, clear to auscultation bilaterally.  HEART: Irregular rhythm  BACK: No kyphosis or scoliosis; no CVA tenderness ABDOMEN: soft and non-tender; no masses, no organomegaly, normal abdominal bowel sounds; no pannus; no intertriginous candida. There is no rebound and no distention. Rectal Exam: Not done EXTREMITIES: No bone or joint deformity; age-appropriate arthropathy of the hands and knees; no edema; no ulcerations.  There is no calf tenderness. Genitalia: not examined PULSES: 2+ and symmetric SKIN: Normal hydration no rash or ulceration CNS: Cranial nerves 2-12 grossly intact no focal lateralizing neurologic deficit.  Speech is fluent; uvula elevated with phonation, facial symmetry and tongue midline. DTR are normal bilaterally, cerebella exam is intact, barbinski is negative and strengths are equaled bilaterally.  No sensory loss.   Labs on Admission:  Basic Metabolic Panel:  Recent Labs Lab 12/09/15 1009  NA 135  K 3.6  CL 89*  CO2 39*  GLUCOSE 76  BUN 12  CREATININE 0.65  CALCIUM 8.5*   CBC:  Recent Labs Lab 12/09/15 1009   WBC 15.1*  NEUTROABS 13.0*  HGB 11.9*  HCT 37.4  MCV 121.0*  PLT 604*   CBG:  Recent Labs Lab 12/09/15 1635  GLUCAP 125*     Radiological Exams on Admission: Dg Chest 1 View  12/09/2015  CLINICAL DATA:  Fall with pain. Initial encounter. EXAM: CHEST 1 VIEW COMPARISON:  09/07/2015 FINDINGS: Peripheral indistinct airspace opacity at the right base. There is no edema, effusion, or pneumothorax. No cardiomegaly. Status  post CABG. Left basilar density attributed mitral annular calcification. IMPRESSION: Small nonspecific opacity at the right base. In the setting of trauma favor aspiration or atelectasis. No neighboring rib fracture. Correlate for infectious symptoms. Electronically Signed   By: Monte Fantasia M.D.   On: 12/09/2015 10:52   Dg Lumbar Spine Complete  12/09/2015  CLINICAL DATA:  Fall with lower back pain.  Initial encounter. EXAM: LUMBAR SPINE - COMPLETE 4+ VIEW COMPARISON:  02/25/2010 abdominal CT FINDINGS: L2 and L4 superior endplate fractures without visible/unhealed fracture line. Height loss at L4 approaches 50%. No traumatic malalignment. Advanced lower lumbar facet arthropathy. L5-S1 degenerative disc narrowing. Osteopenia and extensive atherosclerosis. Numerous right renal calculi IMPRESSION: L2 and L4 compression fractures that are favored chronic. No definitive acute injury. Electronically Signed   By: Monte Fantasia M.D.   On: 12/09/2015 10:57   Dg Shoulder Left  12/09/2015  CLINICAL DATA:  Left hip fracture.  Left shoulder pain. EXAM: LEFT SHOULDER - 2+ VIEW COMPARISON:  09/07/2015. FINDINGS: Acromioclavicular and glenohumeral degenerative change. Diffuse osteopenia. No evidence of fracture or dislocation. IMPRESSION: Acromioclavicular and glenohumeral degenerative change. Diffuse osteopenia. No acute abnormality. Electronically Signed   By: Marcello Moores  Register   On: 12/09/2015 10:52   Dg Hip Unilat With Pelvis 2-3 Views Left  12/09/2015  CLINICAL DATA:  Pt fell  today/recent left hip fracture with surgical repair in April 2017 Pain left hip and left shoulder and lower back /htn/copd Pt unable to lay on left side due to increased pain in left hip EXAM: DG HIP (WITH OR WITHOUT PELVIS) 2-3V LEFT COMPARISON:  09/05/2015 FINDINGS: No acute fracture. Previous intertrochanteric fracture has been reduced, fixated, with a short intra medullary rod and a pen. The orthopedic hardware is well-seated and well-aligned and without significant change from the operative images. Left hip joint normally spaced and aligned. Bony pelvis appears intact.  Bones are demineralized. IMPRESSION: 1. No acute fracture or dislocation. 2. No evidence of loosening/disruption of the orthopedic hardware. Electronically Signed   By: Lajean Manes M.D.   On: 12/09/2015 10:54    EKG: Independently reviewed.    Assessment/Plan Present on Admission:  . Persistent atrial fibrillation ([place]) . Essential thrombocythemia ([place]) . Essential hypertension . PVD (peripheral vascular disease) ([place]) . Atrial fibrillation with RVR ([place]) . Dyslipidemia . Paroxysmal a-fib (HCC)  PLAN:   Afib with RVR:    Stable.  Will continue with Eliquis, and will continue with her IV cardiazem drip.  Check TSH and    HTN:  Will continue with her meds. BP is controlled.   Other plans as per orders. Code Status: DNR.    Orvan Falconer, MD. FACP [facility] Pager [phone] 7pm to 7am.  12/09/2015, 4:41 PM

## 2015-12-09 NOTE — ED Notes (Addendum)
Pt reports she fell off of toilet this morning and is c/o left hip pain.  Hip was previously replaced earlier this year.  Pt got self out of floor on own accord.  Heart rate found to be elevated, but pt denies complaints.

## 2015-12-09 NOTE — ED Provider Notes (Signed)
CSN: [id number]     Arrival date & time 12/09/15  0929 History  By signing my name below, I, Theresa Bird, attest that this documentation has been prepared under the direction and in the presence of Ripley Fraise, MD. Electronically Signed: Rayna Bird, ED Scribe. 12/09/2015. 9:56 AM.   Chief Complaint  Patient presents with  . Hip Pain   [name] presenting with hip pain. The history is provided by the patient and a relative. No language interpreter was used.  Hip Pain This is a recurrent problem. The current episode started less than 1 hour ago. The problem occurs rarely. The problem has not changed since onset.Associated symptoms include chest pain. Pertinent negatives include no abdominal pain and no headaches. The symptoms are aggravated by walking and bending. Nothing relieves the symptoms. She has tried nothing for the symptoms. The treatment provided no relief.    HPI Comments: Theresa Bird is a 82 y.o. female who presents to the Emergency Department complaining of a fall that occurred PTA. Pt states she was attempting to sit on the toilet and instead struck the safety rail resulting in a fall to the floor. She reports associated left hip pain, back pain and chest wall pain. She reports a hx of left hip fracture in 09/2015. Pt confirms her PMHx of a-fib and denies she is currently on blood thinning medications. Her cardiologist is Dr. Domenic Polite. Her HR in triage was 143 BPM but denies she feels as if her heart is racing. Pt denies LOC, head trauma, HA, CP and abd pain.   Past Medical History  Diagnosis Date  . Depression   . Essential hypertension, benign   . Hypercholesteremia   . Insulin dependent diabetes mellitus ([place])   . Essential thrombocytosis (H[facility])     JAK2 negative  . Coronary atherosclerosis of native coronary artery     Multivessel status post CABG in [place]  . Atrial fibrillation ([place])   . Myelofibrosis ([place])   . PAD (peripheral artery  disease) ([place])   . Myocardial infarction ([place])   . Dysrhythmia     A FIB  . Seizure ([place])     NONE IN 15 YEARS  . COPD (chronic obstructive pulmonary disease) (HCC)     USES 2L O2  HS  . Shortness of breath dyspnea     W/ EXERTION   . Tuberculosis     PARENTS BOTH HAD TB   . History of kidney stones    Past Surgical History  Procedure Laterality Date  . Abdominal hysterectomy    . Cataract extraction    . Coronary artery bypass graft      Possibly 2002 in [place]  . Cardioversion N/A 09/09/2014    Procedure: CARDIOVERSION;  Surgeon: Josue Hector, MD;  Location: [facility] ENDOSCOPY;  Service: Cardiovascular;  Laterality: N/A;  . Cardioversion N/A 10/03/2014    Procedure: CARDIOVERSION;  Surgeon: Arnoldo Lenis, MD;  Location: [facility] ORS;  Service: Endoscopy;  Laterality: N/A;  . Peripheral vascular catheterization N/A 08/27/2015    Procedure: Abdominal Aortogram w/Lower Extremity;  Surgeon: Conrad Tyrone, MD;  Location: [facility] CV LAB;  Service: Cardiovascular;  Laterality: N/A;  . Intramedullary (im) nail intertrochanteric Left 09/05/2015    Procedure: INTRAMEDULLARY (IM) NAIL INTERTROCHANTRIC;  Surgeon: Newt Minion, MD;  Location: [facility];  Service: Orthopedics;  Laterality: Left;  . Femoral-popliteal bypass graft Right 10/16/2015    Procedure: RIGHT BYPASS GRAFT FEMORAL-BELOW KNEE POPLITEAL ARTERY USING 6  MM PROPATEN GORTEX GRAFT;  Surgeon: Serafina Mitchell, MD;  Location: [facility] OR;  Service: Vascular;  Laterality: Right;   Family History  Problem Relation Age of Onset  . Coronary artery disease Mother   . COPD Other    Social History  Substance Use Topics  . Smoking status: Never Smoker   . Smokeless tobacco: Never Used  . Alcohol Use: 0.0 oz/week    0 Standard drinks or equivalent per week     Comment: Socially   OB History    No data available     Review of Systems  Cardiovascular: Positive for chest pain.  Gastrointestinal: Negative for abdominal pain.  Musculoskeletal:  Positive for myalgias, back pain and arthralgias.  Neurological: Negative for syncope and headaches.  All other systems reviewed and are negative.   Allergies  Review of patient's allergies indicates no known allergies.  Home Medications   Prior to Admission medications   Medication Sig Start Date End Date Taking? Authorizing Provider  acetaminophen (TYLENOL) 500 MG tablet Take 1 tablet (500 mg total) by mouth every 6 (six) hours as needed for mild pain. 09/05/15   Newt Minion, MD  albuterol (PROAIR HFA) 108 (90 BASE) MCG/ACT inhaler Inhale 2 puffs into the lungs every 6 (six) hours as needed for wheezing or shortness of breath.    Historical Provider, MD  alendronate (FOSAMAX) 35 MG tablet Take by mouth.    Historical Provider, MD  Amino Acids-Protein Hydrolys (FEEDING SUPPLEMENT, PRO-STAT SUGAR FREE 64,) LIQD Take 60 mLs by mouth 3 (three) times daily with meals.    Historical Provider, MD  apixaban (ELIQUIS) 2.5 MG TABS tablet Take 1 tablet (2.5 mg total) by mouth 2 (two) times daily. 09/07/15   Shela Leff, MD  aspirin 81 MG tablet Take 81 mg by mouth daily.    Historical Provider, MD  atorvastatin (LIPITOR) 40 MG tablet Take 40 mg by mouth every evening.    Historical Provider, MD  calcium carbonate (OS-CAL) 600 MG tablet Take 1 tablet (600 mg total) by mouth 2 (two) times daily with a meal. 09/07/15   Shela Leff, MD  cholecalciferol (VITAMIN D) 1000 units tablet Take 1 tablet (1,000 Units total) by mouth daily. 09/07/15   Shela Leff, MD  ciprofloxacin (CIPRO) 500 MG tablet Take 500 mg by mouth 2 (two) times daily. Reported on 11/16/2015    Historical Provider, MD  diltiazem (CARTIA XT) 180 MG 24 hr capsule Take 1 capsule (180 mg total) by mouth daily. 09/14/15   Satira Sark, MD  docusate sodium (COLACE) 100 MG capsule Take 100 mg by mouth 2 (two) times daily.    Historical Provider, MD  fentaNYL (DURAGESIC - DOSED MCG/HR) 25 MCG/HR patch Place 25 mcg onto the skin  every 3 (three) days.    Historical Provider, MD  furosemide (LASIX) 20 MG tablet Take 20 mg by mouth daily as needed for fluid.     Historical Provider, MD  hydroxyurea (HYDREA) 500 MG capsule Take 500 mg by mouth 2 (two) times daily.  04/14/14   Historical Provider, MD  insulin aspart protamine - aspart (NOVOLOG MIX 70/30 FLEXPEN) (70-30) 100 UNIT/ML FlexPen Inject 15 Units into the skin 2 (two) times daily.    Historical Provider, MD  Melatonin-Pyridoxine (MELATIN PO) Take 3 mg by mouth at bedtime.    Historical Provider, MD  metoprolol succinate (TOPROL-XL) 25 MG 24 hr tablet Take 25 mg by mouth daily.    Historical Provider, MD  Multiple  Vitamin (MULTIVITAMIN) tablet Take 1 tablet by mouth daily.    Historical Provider, MD  oxyCODONE-acetaminophen (PERCOCET/ROXICET) 5-325 MG tablet Take 1 tablet by mouth every 4 (four) hours as needed for severe pain. 08/27/15   Samantha J Rhyne, PA-C  OXYGEN Inhale 2 L into the lungs every evening. And night for COPD    Historical Provider, MD  sertraline (ZOLOFT) 50 MG tablet Take 50 mg by mouth daily.    Historical Provider, MD  traMADol (ULTRAM) 50 MG tablet Take 1 tablet (50 mg total) by mouth every 6 (six) hours as needed for moderate pain. 10/17/15   Samantha J Rhyne, PA-C  vitamin C (ASCORBIC ACID) 500 MG tablet Take 500 mg by mouth 2 (two) times daily.    Historical Provider, MD  zinc sulfate 220 (50 Zn) MG capsule Take 220 mg by mouth daily.    Historical Provider, MD   BP 102/74 mmHg  Pulse 143  Temp(Src) 97.9 F (36.6 C) (Oral)  Resp 18  Ht 5\' 9"  (1.753 m)  Wt 144 lb (65.318 kg)  BMI 21.26 kg/m2  SpO2 94% Physical Exam CONSTITUTIONAL: Elderly and frail.  HEAD: Normocephalic/atraumatic EYES: EOMI/PERRL ENMT: Mucous membranes moist NECK: supple no meningeal signs SPINE/BACK: lumbar spinal tenderness noted.  CV: tachycardic  LUNGS: Lungs are clear to auscultation bilaterally, no apparent distress ABDOMEN: soft, nontender, no rebound or  guarding, bowel sounds noted throughout abdomen GU:no cva tenderness NEURO: Pt is awake/alert/appropriate, moves all extremitiesx4.  No facial droop.   EXTREMITIES: pulses normal/equal, full ROM, tenderness with ROM of left hip, tenderness with ROM of left shoulder, no deformities noted, mild left chest wall tenderness SKIN: warm, color normal, sacral wound noted, wound noted to heel of left foot PSYCH: no abnormalities of mood noted, alert and oriented to situation ED Course  Procedures  CRITICAL CARE Performed by: Sharyon Cable Total critical care time: 33 minutes Critical care time was exclusive of separately billable procedures and treating other patients. Critical care was necessary to treat or prevent imminent or life-threatening deterioration. Critical care was time spent personally by me on the following activities: development of treatment plan with patient and/or surrogate as well as nursing, discussions with consultants, evaluation of patient's response to treatment, examination of patient, obtaining history from patient or surrogate, ordering and performing treatments and interventions, ordering and review of laboratory studies, ordering and review of radiographic studies, pulse oximetry and re-evaluation of patient's condition. PATIENT WITH UNCONTROLLED ATRIAL FIBRILLATION WITH HEART RATE >140 REQUIRING IV BOLUS AND DRIP OF CARDIZEM  DIAGNOSTIC STUDIES: Oxygen Saturation is 94% on [place] 2L/min, adequate by my interpretation.    COORDINATION OF CARE: 9:54 AM Discussed next steps with pt. Pt verbalized understanding and is agreeable with the plan.  1:04 PM ALL IMAGING NEGATIVE FOR ACUTE FRACTURE SHE MAY HAVE OLD LUMBAR FX HOWEVER, SHE IS STILL IN ATRIAL FIBRILLATION WITH RVR REQUIRING IV CARDIZEM AND I AM UNABLE TO CONTROL HEART RATE WILL ADMIT D/W DR LE FOR ADMISSION  Labs Review Labs Reviewed  CBC WITH DIFFERENTIAL/PLATELET - Abnormal; Notable for the following:    WBC  15.1 (*)    RBC 3.09 (*)    Hemoglobin 11.9 (*)    MCV 121.0 (*)    MCH 38.5 (*)    Platelets 604 (*)    Neutro Abs 13.0 (*)    All other components within normal limits  BASIC METABOLIC PANEL - Abnormal; Notable for the following:    Chloride 89 (*)    CO2 39 (*)  Calcium 8.5 (*)    All other components within normal limits    Imaging Review Dg Chest 1 View  12/09/2015  CLINICAL DATA:  Fall with pain. Initial encounter. EXAM: CHEST 1 VIEW COMPARISON:  09/07/2015 FINDINGS: Peripheral indistinct airspace opacity at the right base. There is no edema, effusion, or pneumothorax. No cardiomegaly. Status post CABG. Left basilar density attributed mitral annular calcification. IMPRESSION: Small nonspecific opacity at the right base. In the setting of trauma favor aspiration or atelectasis. No neighboring rib fracture. Correlate for infectious symptoms. Electronically Signed   By: Monte Fantasia M.D.   On: 12/09/2015 10:52   Dg Lumbar Spine Complete  12/09/2015  CLINICAL DATA:  Fall with lower back pain.  Initial encounter. EXAM: LUMBAR SPINE - COMPLETE 4+ VIEW COMPARISON:  02/25/2010 abdominal CT FINDINGS: L2 and L4 superior endplate fractures without visible/unhealed fracture line. Height loss at L4 approaches 50%. No traumatic malalignment. Advanced lower lumbar facet arthropathy. L5-S1 degenerative disc narrowing. Osteopenia and extensive atherosclerosis. Numerous right renal calculi IMPRESSION: L2 and L4 compression fractures that are favored chronic. No definitive acute injury. Electronically Signed   By: Monte Fantasia M.D.   On: 12/09/2015 10:57   Dg Shoulder Left  12/09/2015  CLINICAL DATA:  Left hip fracture.  Left shoulder pain. EXAM: LEFT SHOULDER - 2+ VIEW COMPARISON:  09/07/2015. FINDINGS: Acromioclavicular and glenohumeral degenerative change. Diffuse osteopenia. No evidence of fracture or dislocation. IMPRESSION: Acromioclavicular and glenohumeral degenerative change. Diffuse  osteopenia. No acute abnormality. Electronically Signed   By: Marcello Moores  Register   On: 12/09/2015 10:52   Dg Hip Unilat With Pelvis 2-3 Views Left  12/09/2015  CLINICAL DATA:  Pt fell today/recent left hip fracture with surgical repair in April 2017 Pain left hip and left shoulder and lower back /htn/copd Pt unable to lay on left side due to increased pain in left hip EXAM: DG HIP (WITH OR WITHOUT PELVIS) 2-3V LEFT COMPARISON:  09/05/2015 FINDINGS: No acute fracture. Previous intertrochanteric fracture has been reduced, fixated, with a short intra medullary rod and a pen. The orthopedic hardware is well-seated and well-aligned and without significant change from the operative images. Left hip joint normally spaced and aligned. Bony pelvis appears intact.  Bones are demineralized. IMPRESSION: 1. No acute fracture or dislocation. 2. No evidence of loosening/disruption of the orthopedic hardware. Electronically Signed   By: Lajean Manes M.D.   On: 12/09/2015 10:54   I have personally reviewed and evaluated these images and lab results as part of my medical decision-making.    EKG Interpretation   Date/Time:  Wednesday December 09 2015 11:53:51 EDT Ventricular Rate:  126 PR Interval:    QRS Duration: 95 QT Interval:  356 QTC Calculation: 516 R Axis:   83 Text Interpretation:  Atrial fibrillation Borderline right axis deviation  Probable anteroseptal infarct, old Prolonged QT interval Baseline wander  in lead(s) V1 Abnormal ekg Confirmed by Christy Gentles  MD, Micaela Stith ([id number]) on  12/09/2015 12:01:00 PM     Medications  diltiazem (CARDIZEM) 1 mg/mL load via infusion 10 mg (10 mg Intravenous Given 12/09/15 1110)    And  diltiazem (CARDIZEM) 100 mg in dextrose 5 % 100 mL (1 mg/mL) infusion (7.5 mg/hr Intravenous Rate/Dose Change 12/09/15 1130)  fentaNYL (SUBLIMAZE) injection 50 mcg (50 mcg Intravenous Given 12/09/15 1011)    MDM   Final diagnoses:  Atrial fibrillation with rapid ventricular response (HCC)   Sprain of left hip, initial encounter  Sprain of left shoulder, initial encounter  Nursing notes including past medical history and social history reviewed and considered in documentation xrays/imaging reviewed by myself and considered during evaluation Labs/vital reviewed myself and considered during evaluation   I personally performed the services described in this documentation, which was scribed in my presence. The recorded information has been reviewed and is accurate.       Ripley Fraise, MD 12/09/15 [phone]

## 2015-12-09 NOTE — ED Provider Notes (Signed)
This patients CHA2DS2-VASc Score and unadjusted Ischemic Stroke Rate (% per year) is equal to 7.2 % stroke rate/year from a score of 5  Above score calculated as 1 point each if present [CHF, HTN, DM, Vascular=MI/PAD/Aortic Plaque, Age if 65-74, or Female] Above score calculated as 2 points each if present [Age > 75, or Stroke/TIA/TE]    Ripley Fraise, MD 12/09/15 1347

## 2015-12-10 DIAGNOSIS — D473 Essential (hemorrhagic) thrombocythemia: Secondary | ICD-10-CM | POA: Diagnosis not present

## 2015-12-10 DIAGNOSIS — I4891 Unspecified atrial fibrillation: Secondary | ICD-10-CM | POA: Diagnosis not present

## 2015-12-10 DIAGNOSIS — I10 Essential (primary) hypertension: Secondary | ICD-10-CM | POA: Diagnosis not present

## 2015-12-10 LAB — BASIC METABOLIC PANEL
Anion gap: 7 (ref 5–15)
BUN: 11 mg/dL (ref 6–20)
CHLORIDE: 91 mmol/L — AB (ref 101–111)
CO2: 37 mmol/L — ABNORMAL HIGH (ref 22–32)
CREATININE: 0.48 mg/dL (ref 0.44–1.00)
Calcium: 8.2 mg/dL — ABNORMAL LOW (ref 8.9–10.3)
GFR calc Af Amer: >60 mL/min (ref >60)
GFR calc non Af Amer: >60 mL/min (ref >60)
GLUCOSE: 165 mg/dL — AB (ref 65–99)
Potassium: 3.3 mmol/L — ABNORMAL LOW (ref 3.5–5.1)
SODIUM: 135 mmol/L (ref 135–145)

## 2015-12-10 LAB — CBC
HCT: 36.2 % (ref 36.0–46.0)
Hemoglobin: 11.4 g/dL — ABNORMAL LOW (ref 12.0–15.0)
MCH: 38.1 pg — ABNORMAL HIGH (ref 26.0–34.0)
MCHC: 31.5 g/dL (ref 30.0–36.0)
MCV: 121.1 fL — AB (ref 78.0–100.0)
PLATELETS: 556 10*3/uL — AB (ref 150–400)
RBC: 2.99 MIL/uL — ABNORMAL LOW (ref 3.87–5.11)
RDW: 15.2 % (ref 11.5–15.5)
WBC: 15.7 10*3/uL — AB (ref 4.0–10.5)

## 2015-12-10 LAB — GLUCOSE, CAPILLARY
GLUCOSE-CAPILLARY: 146 mg/dL — AB (ref 65–99)
GLUCOSE-CAPILLARY: 244 mg/dL — AB (ref 65–99)
Glucose-Capillary: 167 mg/dL — ABNORMAL HIGH (ref 65–99)
Glucose-Capillary: 269 mg/dL — ABNORMAL HIGH (ref 65–99)

## 2015-12-10 MED ORDER — DILTIAZEM HCL ER COATED BEADS 240 MG PO CP24
240.0000 mg | ORAL_CAPSULE | Freq: Every day | ORAL | Status: DC
  Administered 2015-12-10 – 2015-12-14 (×5): 240 mg via ORAL
  Filled 2015-12-10 (×5): qty 1

## 2015-12-10 MED ORDER — OXYCODONE-ACETAMINOPHEN 5-325 MG PO TABS
1.0000 | ORAL_TABLET | ORAL | Status: DC | PRN
  Administered 2015-12-10 – 2015-12-11 (×2): 2 via ORAL
  Administered 2015-12-11 – 2015-12-14 (×8): 1 via ORAL
  Filled 2015-12-10 (×5): qty 1
  Filled 2015-12-10: qty 2
  Filled 2015-12-10 (×3): qty 1
  Filled 2015-12-10 (×2): qty 2

## 2015-12-10 MED ORDER — BOOST / RESOURCE BREEZE PO LIQD
1.0000 | Freq: Three times a day (TID) | ORAL | Status: DC
  Administered 2015-12-11 – 2015-12-14 (×9): 1 via ORAL

## 2015-12-10 NOTE — Progress Notes (Signed)
[facility] PROGRESS NOTE  Theresa Bird [id number] DOB: 18-Dec-1933    PCP:   Neale Burly, MD   HPI: Theresa Bird is an 82 y.o. female admitted for RVR with known afib on Eliquis, after falling without injury.  Her rate is controlled on IV cardiazem.  She is requesting her home percocet.   Rewiew of Systems:  Constitutional: Negative for malaise, fever and chills. No significant weight loss or weight gain Eyes: Negative for eye pain, redness and discharge, diplopia, visual changes, or flashes of light. ENMT: Negative for ear pain, hoarseness, nasal congestion, sinus pressure and sore throat. No headaches; tinnitus, drooling, or problem swallowing. Cardiovascular: Negative for chest pain, palpitations, diaphoresis, dyspnea and peripheral edema. ; No orthopnea, PND Respiratory: Negative for cough, hemoptysis, wheezing and stridor. No pleuritic chestpain. Gastrointestinal: Negative for nausea, vomiting, diarrhea, constipation, abdominal pain, melena, blood in stool, hematemesis, jaundice and rectal bleeding.    Genitourinary: Negative for frequency, dysuria, incontinence,flank pain and hematuria; Musculoskeletal: Negative for back pain and neck pain. Negative for swelling and trauma.;  Skin: . Negative for pruritus, rash, abrasions, bruising and skin lesion.; ulcerations Neuro: Negative for headache, lightheadedness and neck stiffness. Negative for weakness, altered level of consciousness , altered mental status, extremity weakness, burning feet, involuntary movement, seizure and syncope.  Psych: negative for anxiety, depression, insomnia, tearfulness, panic attacks, hallucinations, paranoia, suicidal or homicidal ideation   Past Medical History  Diagnosis Date  . Depression   . Essential hypertension, benign   . Hypercholesteremia   . Insulin dependent diabetes mellitus ([place])   . Essential thrombocytosis (HCC)     JAK2 negative  . Coronary atherosclerosis of native  coronary artery     Multivessel status post CABG in [place]  . Atrial fibrillation ([place])   . Myelofibrosis ([place])   . PAD (peripheral artery disease) ([place])   . Myocardial infarction ([place])   . Dysrhythmia     A FIB  . Seizure ([place])     NONE IN 15 YEARS  . COPD (chronic obstructive pulmonary disease) (HCC)     USES 2L O2  HS  . Shortness of breath dyspnea     W/ EXERTION   . Tuberculosis     PARENTS BOTH HAD TB   . History of kidney stones     Past Surgical History  Procedure Laterality Date  . Abdominal hysterectomy    . Cataract extraction    . Coronary artery bypass graft      Possibly 2002 in [place]  . Cardioversion N/A 09/09/2014    Procedure: CARDIOVERSION;  Surgeon: Josue Hector, MD;  Location: [facility] ENDOSCOPY;  Service: Cardiovascular;  Laterality: N/A;  . Cardioversion N/A 10/03/2014    Procedure: CARDIOVERSION;  Surgeon: Arnoldo Lenis, MD;  Location: [facility] ORS;  Service: Endoscopy;  Laterality: N/A;  . Peripheral vascular catheterization N/A 08/27/2015    Procedure: Abdominal Aortogram w/Lower Extremity;  Surgeon: Conrad Havre North, MD;  Location: [facility] CV LAB;  Service: Cardiovascular;  Laterality: N/A;  . Intramedullary (im) nail intertrochanteric Left 09/05/2015    Procedure: INTRAMEDULLARY (IM) NAIL INTERTROCHANTRIC;  Surgeon: Newt Minion, MD;  Location: [facility];  Service: Orthopedics;  Laterality: Left;  . Femoral-popliteal bypass graft Right 10/16/2015    Procedure: RIGHT BYPASS GRAFT FEMORAL-BELOW KNEE POPLITEAL ARTERY USING 6 MM PROPATEN GORTEX GRAFT;  Surgeon: Serafina Mitchell, MD;  Location: [facility];  Service: Vascular;  Laterality: Right;    Medications:  HOME MEDS: Prior to Admission medications  Medication Sig Start Date End Date Taking? Authorizing Provider  acetaminophen (TYLENOL) 500 MG tablet Take 1 tablet (500 mg total) by mouth every 6 (six) hours as needed for mild pain. 09/05/15  Yes Newt Minion, MD  albuterol (PROAIR HFA) 108 (90 BASE) MCG/ACT  inhaler Inhale 2 puffs into the lungs every 6 (six) hours as needed for wheezing or shortness of breath.   Yes Historical Provider, MD  aspirin 81 MG tablet Take 81 mg by mouth daily.   Yes Historical Provider, MD  atorvastatin (LIPITOR) 40 MG tablet Take 40 mg by mouth every evening.   Yes Historical Provider, MD  bisacodyl (DULCOLAX) 5 MG EC tablet Take 10 mg by mouth daily as needed for moderate constipation.   Yes Historical Provider, MD  calcium carbonate (OS-CAL) 600 MG tablet Take 1 tablet (600 mg total) by mouth 2 (two) times daily with a meal. 09/07/15  Yes Shela Leff, MD  cholecalciferol (VITAMIN D) 1000 units tablet Take 1 tablet (1,000 Units total) by mouth daily. 09/07/15  Yes Shela Leff, MD  collagenase (SANTYL) ointment Apply 1 application topically daily. Apply to left heel topically every day shift.   Yes Historical Provider, MD  diltiazem (CARTIA XT) 180 MG 24 hr capsule Take 1 capsule (180 mg total) by mouth daily. 09/14/15  Yes Satira Sark, MD  docusate sodium (COLACE) 100 MG capsule Take 100 mg by mouth 2 (two) times daily.   Yes Historical Provider, MD  fentaNYL (DURAGESIC - DOSED MCG/HR) 25 MCG/HR patch Place 25 mcg onto the skin every 3 (three) days.   Yes Historical Provider, MD  furosemide (LASIX) 20 MG tablet Take 20 mg by mouth daily as needed for fluid.    Yes Historical Provider, MD  hydroxyurea (HYDREA) 500 MG capsule Take 500 mg by mouth 2 (two) times daily.  04/14/14  Yes Historical Provider, MD  insulin aspart protamine - aspart (NOVOLOG MIX 70/30 FLEXPEN) (70-30) 100 UNIT/ML FlexPen Inject 15 Units into the skin 2 (two) times daily.   Yes Historical Provider, MD  Melatonin-Pyridoxine (MELATIN PO) Take 3 mg by mouth at bedtime.   Yes Historical Provider, MD  metoprolol succinate (TOPROL-XL) 25 MG 24 hr tablet Take 25 mg by mouth daily.   Yes Historical Provider, MD  Multiple Vitamin (MULTIVITAMIN) tablet Take 1 tablet by mouth daily.   Yes Historical  Provider, MD  ondansetron (ZOFRAN) 4 MG tablet Take 4 mg by mouth every 6 (six) hours as needed for nausea or vomiting.   Yes Historical Provider, MD  oxyCODONE-acetaminophen (PERCOCET/ROXICET) 5-325 MG tablet Take 1 tablet by mouth every 4 (four) hours as needed for severe pain. Patient taking differently: Take 1 tablet by mouth every 6 (six) hours as needed for moderate pain or severe pain.  08/27/15  Yes Samantha J Rhyne, PA-C  sertraline (ZOLOFT) 50 MG tablet Take 50 mg by mouth daily.   Yes Historical Provider, MD  traMADol (ULTRAM) 50 MG tablet Take 1 tablet (50 mg total) by mouth every 6 (six) hours as needed for moderate pain. 10/17/15  Yes Samantha J Rhyne, PA-C  vitamin C (ASCORBIC ACID) 500 MG tablet Take 500 mg by mouth 2 (two) times daily.   Yes Historical Provider, MD  apixaban (ELIQUIS) 2.5 MG TABS tablet Take 1 tablet (2.5 mg total) by mouth 2 (two) times daily. Patient not taking: Reported on 12/09/2015 09/07/15   Shela Leff, MD  ciprofloxacin (CIPRO) 500 MG tablet Take 500 mg by mouth 2 (two) times  daily. Reported on 12/09/2015    Historical Provider, MD  OXYGEN Inhale 2 L into the lungs every evening. And night for COPD    Historical Provider, MD     Allergies:  No Known Allergies  Social History:   reports that she has never smoked. She has never used smokeless tobacco. She reports that she drinks alcohol. She reports that she does not use illicit drugs.  Family History: Family History  Problem Relation Age of Onset  . Coronary artery disease Mother   . COPD Other      Physical Exam: Filed Vitals:   12/10/15 0500 12/10/15 0600 12/10/15 0700 12/10/15 0752  BP: 96/71 113/62 96/56   Pulse: 140 113 89   Temp:    97 F (36.1 C)  TempSrc:    Oral  Resp: 32 16 28   Height:      Weight: 65.9 kg (145 lb 4.5 oz)     SpO2: 98% 97% 97%    Blood pressure 96/56, pulse 89, temperature 97 F (36.1 C), temperature source Oral, resp. rate 28, height 5\' 9"  (1.753 m), weight  65.9 kg (145 lb 4.5 oz), SpO2 97 %.  GEN:  Pleasant patient lying in the stretcher in no acute distress; cooperative with exam. PSYCH:  alert and oriented x4; does not appear anxious or depressed; affect is appropriate. HEENT: Mucous membranes pink and anicteric; PERRLA; EOM intact; no cervical lymphadenopathy nor thyromegaly or carotid bruit; no JVD; There were no stridor. Neck is very supple. Breasts:: Not examined CHEST WALL: No tenderness CHEST: Normal respiration, clear to auscultation bilaterally.  HEART: Regular rate and rhythm.  There are no murmur, rub, or gallops.   BACK: No kyphosis or scoliosis; no CVA tenderness ABDOMEN: soft and non-tender; no masses, no organomegaly, normal abdominal bowel sounds; no pannus; no intertriginous candida. There is no rebound and no distention. Rectal Exam: Not done EXTREMITIES: No bone or joint deformity; age-appropriate arthropathy of the hands and knees; no edema; no ulcerations.  There is no calf tenderness. Genitalia: not examined PULSES: 2+ and symmetric SKIN: Normal hydration no rash or ulceration CNS: Cranial nerves 2-12 grossly intact no focal lateralizing neurologic deficit.  Speech is fluent; uvula elevated with phonation, facial symmetry and tongue midline. DTR are normal bilaterally, cerebella exam is intact, barbinski is negative and strengths are equaled bilaterally.  No sensory loss.   Labs on Admission:  Basic Metabolic Panel:  Recent Labs Lab 12/09/15 1009 12/10/15 0409  NA 135 135  K 3.6 3.3*  CL 89* 91*  CO2 39* 37*  GLUCOSE 76 165*  BUN 12 11  CREATININE 0.65 0.48  CALCIUM 8.5* 8.2*    CBC:  Recent Labs Lab 12/09/15 1009 12/10/15 0409  WBC 15.1* 15.7*  NEUTROABS 13.0*  --   HGB 11.9* 11.4*  HCT 37.4 36.2  MCV 121.0* 121.1*  PLT 604* 556*   CBG:  Recent Labs Lab 12/09/15 1635 12/09/15 2135 12/10/15 0739  GLUCAP 125* 350* 167*     Radiological Exams on Admission: Dg Chest 1 View  12/09/2015   CLINICAL DATA:  Fall with pain. Initial encounter. EXAM: CHEST 1 VIEW COMPARISON:  09/07/2015 FINDINGS: Peripheral indistinct airspace opacity at the right base. There is no edema, effusion, or pneumothorax. No cardiomegaly. Status post CABG. Left basilar density attributed mitral annular calcification. IMPRESSION: Small nonspecific opacity at the right base. In the setting of trauma favor aspiration or atelectasis. No neighboring rib fracture. Correlate for infectious symptoms. Electronically Signed  By: Monte Fantasia M.D.   On: 12/09/2015 10:52   Dg Lumbar Spine Complete  12/09/2015  CLINICAL DATA:  Fall with lower back pain.  Initial encounter. EXAM: LUMBAR SPINE - COMPLETE 4+ VIEW COMPARISON:  02/25/2010 abdominal CT FINDINGS: L2 and L4 superior endplate fractures without visible/unhealed fracture line. Height loss at L4 approaches 50%. No traumatic malalignment. Advanced lower lumbar facet arthropathy. L5-S1 degenerative disc narrowing. Osteopenia and extensive atherosclerosis. Numerous right renal calculi IMPRESSION: L2 and L4 compression fractures that are favored chronic. No definitive acute injury. Electronically Signed   By: Monte Fantasia M.D.   On: 12/09/2015 10:57   Dg Shoulder Left  12/09/2015  CLINICAL DATA:  Left hip fracture.  Left shoulder pain. EXAM: LEFT SHOULDER - 2+ VIEW COMPARISON:  09/07/2015. FINDINGS: Acromioclavicular and glenohumeral degenerative change. Diffuse osteopenia. No evidence of fracture or dislocation. IMPRESSION: Acromioclavicular and glenohumeral degenerative change. Diffuse osteopenia. No acute abnormality. Electronically Signed   By: Marcello Moores  Register   On: 12/09/2015 10:52   Dg Hip Unilat With Pelvis 2-3 Views Left  12/09/2015  CLINICAL DATA:  Pt fell today/recent left hip fracture with surgical repair in April 2017 Pain left hip and left shoulder and lower back /htn/copd Pt unable to lay on left side due to increased pain in left hip EXAM: DG HIP (WITH OR WITHOUT  PELVIS) 2-3V LEFT COMPARISON:  09/05/2015 FINDINGS: No acute fracture. Previous intertrochanteric fracture has been reduced, fixated, with a short intra medullary rod and a pen. The orthopedic hardware is well-seated and well-aligned and without significant change from the operative images. Left hip joint normally spaced and aligned. Bony pelvis appears intact.  Bones are demineralized. IMPRESSION: 1. No acute fracture or dislocation. 2. No evidence of loosening/disruption of the orthopedic hardware. Electronically Signed   By: Lajean Manes M.D.   On: 12/09/2015 10:54    EKG: Independently reviewed.    Assessment/Plan Present on Admission:  . Persistent atrial fibrillation ([place]) . Essential thrombocythemia ([place]) . Essential hypertension . PVD (peripheral vascular disease) ([place]) . Atrial fibrillation with RVR ([place]) . Dyslipidemia . Paroxysmal a-fib (HCC)  PLAN:    Afib with RVR:  Will transition her to oral Cardiazem at slightly higher dose.  Increase ambulation and follow HR.  Keep in ICU today and plan to d/c home from ICU tomorrow if she does well.  HTN:  Stable.  HLD:  Continue with statin.   Other plans as per orders. Code Status: FULL Haskel Khan, MD.  FACP [facility] Pager [phone] 7pm to 7am.  12/10/2015, 8:22 AM

## 2015-12-10 NOTE — Progress Notes (Signed)
Inpatient Diabetes Program Recommendations  AACE/ADA: New Consensus Statement on Inpatient Glycemic Control (2015)  Target Ranges:  Prepandial:   less than 140 mg/dL      Peak postprandial:   less than 180 mg/dL (1-2 hours)      Critically ill patients:  140 - 180 mg/dL   Results for Theresa Bird, Theresa Bird (MRN [id number]) as of 12/10/2015 12:53  Ref. Range 12/10/2015 07:39 12/10/2015 11:45  Glucose-Capillary Latest Ref Range: 65-99 mg/dL 167 (H) 269 (H)    Admit CP/ Afib w/ RVR.   History: DM, CAD, COPD  Home DM Meds: 70/30 Insulin- 15 units bidwc  Current Insulin Orders: Novolog Moderate Correction Scale/ SSI (0-15 units) TID AC + HS      MD- Please consider starting 80% of patient's home dose of 70/30 Insulin:  70/30 Insulin 12 units bid with meals     --Will follow patient during hospitalization--  Wyn Quaker RN, MSN, CDE Diabetes Coordinator Inpatient Glycemic Control Team Team Pager: [phone] (8a-5p)

## 2015-12-10 NOTE — Progress Notes (Signed)
Initial Nutrition Assessment  DOCUMENTATION CODES:   Non-severe (moderate) malnutrition in context of chronic illness  INTERVENTION:  D/c Ensure Enlive (pt is refusing)   Boost Breeze po TID, each supplement provides 250 kcal and 9 grams of protein   CHO modified diet   Food preferences obtained. Pt is a self-reported "picky eater".   NUTRITION DIAGNOSIS:   Malnutrition related to chronic illness, wound healing as evidenced by mild depletion of body fat, moderate depletions of muscle mass.  GOAL:   Patient will meet greater than or equal to 90% of their needs   MONITOR:   PO intake, Supplement acceptance, Weight trends, Skin  REASON FOR ASSESSMENT:   Malnutrition Screening Tool    ASSESSMENT: Patient presents with chest pain. Her daughter is here with her.  Back in April the patient had mechanical fall and fractured her left femur is s/p IM Nail placement. She went to [facility] for rehab. Pt says she just has not been eating well. Her appetite is poor and she has multiple wounds. Chronic inflammation may also be influencing her ability to eat well. She is picky by her own admission and provided a list of foods she will eat. No protein foods listed and we discussed the importance of protein to promote wound healing which she says she has "heard before". At [facility] she had tried a protein modular but loathed the taste of it. She is refusing to "try" the protein product here but will drink Boost Breeze. It's not ideal due to the limited protein content.  Her weight is down 6% in the past 90 days which is trending toward significant. She has increased nutrient needs related to wound healing and her meal intake is averaging 25%. She is taking a Multivitamin and vitamin C.     Recent Labs Lab 12/09/15 1009 12/10/15 0409  NA 135 135  K 3.6 3.3*  CL 89* 91*  CO2 39* 37*  BUN 12 11  CREATININE 0.65 0.48  CALCIUM 8.5* 8.2*  GLUCOSE 76 165*   Labs: reviewed  NFPE  completed. Mild and moderate muscle and fat mass loss. No edema noted.   Diet Order:  Diet Carb Modified Fluid consistency:: Thin; Room service appropriate?: Yes  Skin:   multiple wounds: stage III and Unstagable. She is followed by the wound center at [facility].  Last BM:  7/5   Height:   Ht Readings from Last 1 Encounters:  12/09/15 5\' 9"  (1.753 m)    Weight:   Wt Readings from Last 1 Encounters:  12/10/15 145 lb 4.5 oz (65.9 kg)    Ideal Body Weight:  66 kg  BMI:  Body mass index is 21.44 kg/(m^2).  Estimated Nutritional Needs:   Kcal:  1800-1975  Protein:  85-95 gr  Fluid:  1.8-2.0 liters daily  EDUCATION NEEDS:   Education needs addressed  Colman Cater MS,RD,CSG,LDN Office: [phone] Pager: [phone]

## 2015-12-10 NOTE — Care Management Obs Status (Signed)
[place] NOTIFICATION   Patient Details  Name: Theresa Bird MRN: [id number] Date of Birth: August 26, 1933   Medicare Observation Status Notification Given:  Yes    Sherald Barge, RN 12/10/2015, 1:52 PM

## 2015-12-10 NOTE — Care Management Note (Signed)
Case Management Note  Patient Details  Name: Theresa Bird MRN: [id number] Date of Birth: [birth year]  Subjective/Objective:                  Pt is from home, lives with her daughter and is ind with ADL's. Pt has walker that she uses for mobility. Pt has home O2 with port tanks, she states she uses it only as needed. Pt is active with [facility] RN and PT services. Pt plans to resume [place] services. Tim Justis, of [facility], aware of admission and DC plan. Pt will need order to resume services at DC.   Action/Plan: Will cont to follow.   Expected Discharge Date:      12/12/2015            Expected Discharge Plan:  [place]  In-House Referral:  NA  Discharge planning Services  CM Consult  Post Acute Care Choice:  Home Health, Resumption of Svcs/PTA Provider Choice offered to:  Patient  DME Arranged:    DME Agency:     HH Arranged:  RN, PT [place] Agency:  [facility] (now [facility])  Status of Service:  In process, will continue to follow  If discussed at Long Length of Stay Meetings, dates discussed:    Additional Comments:  Sherald Barge, RN 12/10/2015, 1:53 PM

## 2015-12-11 DIAGNOSIS — Z955 Presence of coronary angioplasty implant and graft: Secondary | ICD-10-CM | POA: Diagnosis not present

## 2015-12-11 DIAGNOSIS — Z951 Presence of aortocoronary bypass graft: Secondary | ICD-10-CM | POA: Diagnosis not present

## 2015-12-11 DIAGNOSIS — E440 Moderate protein-calorie malnutrition: Secondary | ICD-10-CM | POA: Diagnosis present

## 2015-12-11 DIAGNOSIS — R079 Chest pain, unspecified: Secondary | ICD-10-CM | POA: Diagnosis present

## 2015-12-11 DIAGNOSIS — Z6821 Body mass index (BMI) 21.0-21.9, adult: Secondary | ICD-10-CM | POA: Diagnosis not present

## 2015-12-11 DIAGNOSIS — I2510 Atherosclerotic heart disease of native coronary artery without angina pectoris: Secondary | ICD-10-CM | POA: Diagnosis present

## 2015-12-11 DIAGNOSIS — Z7982 Long term (current) use of aspirin: Secondary | ICD-10-CM | POA: Diagnosis not present

## 2015-12-11 DIAGNOSIS — Z8781 Personal history of (healed) traumatic fracture: Secondary | ICD-10-CM | POA: Diagnosis not present

## 2015-12-11 DIAGNOSIS — E1151 Type 2 diabetes mellitus with diabetic peripheral angiopathy without gangrene: Secondary | ICD-10-CM | POA: Diagnosis present

## 2015-12-11 DIAGNOSIS — E785 Hyperlipidemia, unspecified: Secondary | ICD-10-CM | POA: Diagnosis not present

## 2015-12-11 DIAGNOSIS — M549 Dorsalgia, unspecified: Secondary | ICD-10-CM | POA: Diagnosis present

## 2015-12-11 DIAGNOSIS — Y92009 Unspecified place in unspecified non-institutional (private) residence as the place of occurrence of the external cause: Secondary | ICD-10-CM | POA: Diagnosis not present

## 2015-12-11 DIAGNOSIS — F419 Anxiety disorder, unspecified: Secondary | ICD-10-CM | POA: Diagnosis present

## 2015-12-11 DIAGNOSIS — Z66 Do not resuscitate: Secondary | ICD-10-CM | POA: Diagnosis present

## 2015-12-11 DIAGNOSIS — W1812XA Fall from or off toilet with subsequent striking against object, initial encounter: Secondary | ICD-10-CM | POA: Diagnosis present

## 2015-12-11 DIAGNOSIS — E7800 Pure hypercholesterolemia, unspecified: Secondary | ICD-10-CM | POA: Diagnosis present

## 2015-12-11 DIAGNOSIS — I481 Persistent atrial fibrillation: Secondary | ICD-10-CM | POA: Diagnosis present

## 2015-12-11 DIAGNOSIS — J449 Chronic obstructive pulmonary disease, unspecified: Secondary | ICD-10-CM | POA: Diagnosis present

## 2015-12-11 DIAGNOSIS — S43402A Unspecified sprain of left shoulder joint, initial encounter: Secondary | ICD-10-CM | POA: Diagnosis present

## 2015-12-11 DIAGNOSIS — I252 Old myocardial infarction: Secondary | ICD-10-CM | POA: Diagnosis not present

## 2015-12-11 DIAGNOSIS — F329 Major depressive disorder, single episode, unspecified: Secondary | ICD-10-CM | POA: Diagnosis present

## 2015-12-11 DIAGNOSIS — S32502A Unspecified fracture of left pubis, initial encounter for closed fracture: Secondary | ICD-10-CM | POA: Diagnosis present

## 2015-12-11 DIAGNOSIS — S73102A Unspecified sprain of left hip, initial encounter: Secondary | ICD-10-CM | POA: Diagnosis present

## 2015-12-11 DIAGNOSIS — D473 Essential (hemorrhagic) thrombocythemia: Secondary | ICD-10-CM | POA: Diagnosis not present

## 2015-12-11 DIAGNOSIS — I10 Essential (primary) hypertension: Secondary | ICD-10-CM | POA: Diagnosis not present

## 2015-12-11 DIAGNOSIS — Z794 Long term (current) use of insulin: Secondary | ICD-10-CM | POA: Diagnosis not present

## 2015-12-11 DIAGNOSIS — I480 Paroxysmal atrial fibrillation: Secondary | ICD-10-CM | POA: Diagnosis present

## 2015-12-11 DIAGNOSIS — L8990 Pressure ulcer of unspecified site, unspecified stage: Secondary | ICD-10-CM | POA: Diagnosis present

## 2015-12-11 DIAGNOSIS — I4891 Unspecified atrial fibrillation: Secondary | ICD-10-CM | POA: Diagnosis not present

## 2015-12-11 DIAGNOSIS — Z7901 Long term (current) use of anticoagulants: Secondary | ICD-10-CM | POA: Diagnosis not present

## 2015-12-11 LAB — GLUCOSE, CAPILLARY
GLUCOSE-CAPILLARY: 200 mg/dL — AB (ref 65–99)
Glucose-Capillary: 158 mg/dL — ABNORMAL HIGH (ref 65–99)
Glucose-Capillary: 212 mg/dL — ABNORMAL HIGH (ref 65–99)
Glucose-Capillary: 201 mg/dL — ABNORMAL HIGH (ref 65–99)

## 2015-12-11 NOTE — Progress Notes (Signed)
Report called and pt transferred to 45, belongings w/ pt, daughter at bedside.

## 2015-12-11 NOTE — Progress Notes (Signed)
[facility] PROGRESS NOTE  Theresa Bird [id number] DOB: Oct 20, 1933    PCP:   Theresa Burly, MD   HPI:  Theresa Bird is an 82 y.o. female admitted for RVR with known afib on Eliquis, after falling without injury. Her rate is controlled on IV cardiazem. She is requesting her home percocet. Her HR is still up and down, transiently, asymptomatically.   Rewiew of Systems:  Constitutional: Negative for malaise, fever and chills. No significant weight loss or weight gain Eyes: Negative for eye pain, redness and discharge, diplopia, visual changes, or flashes of light. ENMT: Negative for ear pain, hoarseness, nasal congestion, sinus pressure and sore throat. No headaches; tinnitus, drooling, or problem swallowing. Cardiovascular: Negative for chest pain, palpitations, diaphoresis, dyspnea and peripheral edema. ; No orthopnea, PND Respiratory: Negative for cough, hemoptysis, wheezing and stridor. No pleuritic chestpain. Gastrointestinal: Negative for nausea, vomiting, diarrhea, constipation, abdominal pain, melena, blood in stool, hematemesis, jaundice and rectal bleeding.    Genitourinary: Negative for frequency, dysuria, incontinence,flank pain and hematuria; Musculoskeletal: Negative for back pain and neck pain. Negative for swelling and trauma.;  Skin: . Negative for pruritus, rash, abrasions, bruising and skin lesion.; ulcerations Neuro: Negative for headache, lightheadedness and neck stiffness. Negative for weakness, altered level of consciousness , altered mental status, extremity weakness, burning feet, involuntary movement, seizure and syncope.  Psych: negative for anxiety, depression, insomnia, tearfulness, panic attacks, hallucinations, paranoia, suicidal or homicidal ideation    Past Medical History  Diagnosis Date  . Depression   . Essential hypertension, benign   . Hypercholesteremia   . Insulin dependent diabetes mellitus ([place])   . Essential thrombocytosis (HCC)      JAK2 negative  . Coronary atherosclerosis of native coronary artery     Multivessel status post CABG in [place]  . Atrial fibrillation ([place])   . Myelofibrosis ([place])   . PAD (peripheral artery disease) ([place])   . Myocardial infarction ([place])   . Dysrhythmia     A FIB  . Seizure ([place])     NONE IN 15 YEARS  . COPD (chronic obstructive pulmonary disease) (HCC)     USES 2L O2  HS  . Shortness of breath dyspnea     W/ EXERTION   . Tuberculosis     PARENTS BOTH HAD TB   . History of kidney stones     Past Surgical History  Procedure Laterality Date  . Abdominal hysterectomy    . Cataract extraction    . Coronary artery bypass graft      Possibly 2002 in [place]  . Cardioversion N/A 09/09/2014    Procedure: CARDIOVERSION;  Surgeon: Josue Hector, MD;  Location[facility] ENDOSCOPY;  Service: Cardiovascular;  Laterality: N/A;  . Cardioversion N/A 10/03/2014    Procedure: CARDIOVERSION;  Surgeon: Arnoldo Lenis, MD;  Location: [facility] ORS;  Service: Endoscopy;  Laterality: N/A;  . Peripheral vascular catheterization N/A 08/27/2015    Procedure: Abdominal Aortogram w/Lower Extremity;  Surgeon: Conrad Kankakee, MD;  Location: [place] CV LAB;  Service: Cardiovascular;  Laterality: N/A;  . Intramedullary (im) nail intertrochanteric Left 09/05/2015    Procedure: INTRAMEDULLARY (IM) NAIL INTERTROCHANTRIC;  Surgeon: Newt Minion, MD;  Location: [place];  Service: Orthopedics;  Laterality: Left;  . Femoral-popliteal bypass graft Right 10/16/2015    Procedure: RIGHT BYPASS GRAFT FEMORAL-BELOW KNEE POPLITEAL ARTERY USING 6 MM PROPATEN GORTEX GRAFT;  Surgeon: Serafina Mitchell, MD;  Location: [place];  Service: Vascular;  Laterality: Right;  Medications:  HOME MEDS: Prior to Admission medications   Medication Sig Start Date End Date Taking? Authorizing Provider  acetaminophen (TYLENOL) 500 MG tablet Take 1 tablet (500 mg total) by mouth every 6 (six) hours as needed for mild pain. 09/05/15  Yes Newt Minion, MD  albuterol (PROAIR HFA) 108 (90 BASE) MCG/ACT inhaler Inhale 2 puffs into the lungs every 6 (six) hours as needed for wheezing or shortness of breath.   Yes Historical Provider, MD  aspirin 81 MG tablet Take 81 mg by mouth daily.   Yes Historical Provider, MD  atorvastatin (LIPITOR) 40 MG tablet Take 40 mg by mouth every evening.   Yes Historical Provider, MD  bisacodyl (DULCOLAX) 5 MG EC tablet Take 10 mg by mouth daily as needed for moderate constipation.   Yes Historical Provider, MD  calcium carbonate (OS-CAL) 600 MG tablet Take 1 tablet (600 mg total) by mouth 2 (two) times daily with a meal. 09/07/15  Yes Shela Leff, MD  cholecalciferol (VITAMIN D) 1000 units tablet Take 1 tablet (1,000 Units total) by mouth daily. 09/07/15  Yes Shela Leff, MD  collagenase (SANTYL) ointment Apply 1 application topically daily. Apply to left heel topically every day shift.   Yes Historical Provider, MD  diltiazem (CARTIA XT) 180 MG 24 hr capsule Take 1 capsule (180 mg total) by mouth daily. 09/14/15  Yes Satira Sark, MD  docusate sodium (COLACE) 100 MG capsule Take 100 mg by mouth 2 (two) times daily.   Yes Historical Provider, MD  fentaNYL (DURAGESIC - DOSED MCG/HR) 25 MCG/HR patch Place 25 mcg onto the skin every 3 (three) days.   Yes Historical Provider, MD  furosemide (LASIX) 20 MG tablet Take 20 mg by mouth daily as needed for fluid.    Yes Historical Provider, MD  hydroxyurea (HYDREA) 500 MG capsule Take 500 mg by mouth 2 (two) times daily.  04/14/14  Yes Historical Provider, MD  insulin aspart protamine - aspart (NOVOLOG MIX 70/30 FLEXPEN) (70-30) 100 UNIT/ML FlexPen Inject 15 Units into the skin 2 (two) times daily.   Yes Historical Provider, MD  Melatonin-Pyridoxine (MELATIN PO) Take 3 mg by mouth at bedtime.   Yes Historical Provider, MD  metoprolol succinate (TOPROL-XL) 25 MG 24 hr tablet Take 25 mg by mouth daily.   Yes Historical Provider, MD  Multiple Vitamin (MULTIVITAMIN)  tablet Take 1 tablet by mouth daily.   Yes Historical Provider, MD  ondansetron (ZOFRAN) 4 MG tablet Take 4 mg by mouth every 6 (six) hours as needed for nausea or vomiting.   Yes Historical Provider, MD  oxyCODONE-acetaminophen (PERCOCET/ROXICET) 5-325 MG tablet Take 1 tablet by mouth every 4 (four) hours as needed for severe pain. Patient taking differently: Take 1 tablet by mouth every 6 (six) hours as needed for moderate pain or severe pain.  08/27/15  Yes Samantha J Rhyne, PA-C  sertraline (ZOLOFT) 50 MG tablet Take 50 mg by mouth daily.   Yes Historical Provider, MD  traMADol (ULTRAM) 50 MG tablet Take 1 tablet (50 mg total) by mouth every 6 (six) hours as needed for moderate pain. 10/17/15  Yes Samantha J Rhyne, PA-C  vitamin C (ASCORBIC ACID) 500 MG tablet Take 500 mg by mouth 2 (two) times daily.   Yes Historical Provider, MD  apixaban (ELIQUIS) 2.5 MG TABS tablet Take 1 tablet (2.5 mg total) by mouth 2 (two) times daily. Patient not taking: Reported on 12/09/2015 09/07/15   Shela Leff, MD  ciprofloxacin (CIPRO) 500  MG tablet Take 500 mg by mouth 2 (two) times daily. Reported on 12/09/2015    Historical Provider, MD  OXYGEN Inhale 2 L into the lungs every evening. And night for COPD    Historical Provider, MD     Allergies:  No Known Allergies  Social History:   reports that she has never smoked. She has never used smokeless tobacco. She reports that she drinks alcohol. She reports that she does not use illicit drugs.  Family History: Family History  Problem Relation Age of Onset  . Coronary artery disease Mother   . COPD Other      Physical Exam: Filed Vitals:   12/11/15 0523 12/11/15 0600 12/11/15 0700 12/11/15 0800  BP:  122/84 115/88   Pulse:  108 97   Temp: 98.2 F (36.8 C)   97.8 F (36.6 C)  TempSrc: Oral   Oral  Resp:  12 12   Height:      Weight:      SpO2:  100% 96%    Blood pressure 115/88, pulse 97, temperature 97.8 F (36.6 C), temperature source Oral,  resp. rate 12, height 5\' 9"  (1.753 m), weight 65.9 kg (145 lb 4.5 oz), SpO2 96 %.  GEN:  Pleasant patient lying in the stretcher in no acute distress; cooperative with exam. PSYCH:  alert and oriented x4; does not appear anxious or depressed; affect is appropriate. HEENT: Mucous membranes pink and anicteric; PERRLA; EOM intact; no cervical lymphadenopathy nor thyromegaly or carotid bruit; no JVD; There were no stridor. Neck is very supple. Breasts:: Not examined CHEST WALL: No tenderness CHEST: Normal respiration, clear to auscultation bilaterally.  HEART: Regular rate and rhythm.  There are no murmur, rub, or gallops.   BACK: No kyphosis or scoliosis; no CVA tenderness ABDOMEN: soft and non-tender; no masses, no organomegaly, normal abdominal bowel sounds; no pannus; no intertriginous candida. There is no rebound and no distention. Rectal Exam: Not done EXTREMITIES: No bone or joint deformity; age-appropriate arthropathy of the hands and knees; no edema; no ulcerations.  There is no calf tenderness. Genitalia: not examined PULSES: 2+ and symmetric SKIN: Normal hydration no rash or ulceration CNS: Cranial nerves 2-12 grossly intact no focal lateralizing neurologic deficit.  Speech is fluent; uvula elevated with phonation, facial symmetry and tongue midline. DTR are normal bilaterally, cerebella exam is intact, barbinski is negative and strengths are equaled bilaterally.  No sensory loss.   Labs on Admission:  Basic Metabolic Panel:  Recent Labs Lab 12/09/15 1009 12/10/15 0409  NA 135 135  K 3.6 3.3*  CL 89* 91*  CO2 39* 37*  GLUCOSE 76 165*  BUN 12 11  CREATININE 0.65 0.48  CALCIUM 8.5* 8.2*   Liver Function Tests: CBC:  Recent Labs Lab 12/09/15 1009 12/10/15 0409  WBC 15.1* 15.7*  NEUTROABS 13.0*  --   HGB 11.9* 11.4*  HCT 37.4 36.2  MCV 121.0* 121.1*  PLT 604* 556*   Cardiac Enzymes: CBG:  Recent Labs Lab 12/10/15 0739 12/10/15 1145 12/10/15 1628  12/10/15 2031 12/11/15 0744  GLUCAP 167* 269* 146* 244* 158*     Radiological Exams on Admission: Dg Chest 1 View  12/09/2015  CLINICAL DATA:  Fall with pain. Initial encounter. EXAM: CHEST 1 VIEW COMPARISON:  09/07/2015 FINDINGS: Peripheral indistinct airspace opacity at the right base. There is no edema, effusion, or pneumothorax. No cardiomegaly. Status post CABG. Left basilar density attributed mitral annular calcification. IMPRESSION: Small nonspecific opacity at the right base. In the setting  of trauma favor aspiration or atelectasis. No neighboring rib fracture. Correlate for infectious symptoms. Electronically Signed   By: Monte Fantasia M.D.   On: 12/09/2015 10:52   Dg Lumbar Spine Complete  12/09/2015  CLINICAL DATA:  Fall with lower back pain.  Initial encounter. EXAM: LUMBAR SPINE - COMPLETE 4+ VIEW COMPARISON:  02/25/2010 abdominal CT FINDINGS: L2 and L4 superior endplate fractures without visible/unhealed fracture line. Height loss at L4 approaches 50%. No traumatic malalignment. Advanced lower lumbar facet arthropathy. L5-S1 degenerative disc narrowing. Osteopenia and extensive atherosclerosis. Numerous right renal calculi IMPRESSION: L2 and L4 compression fractures that are favored chronic. No definitive acute injury. Electronically Signed   By: Monte Fantasia M.D.   On: 12/09/2015 10:57   Dg Shoulder Left  12/09/2015  CLINICAL DATA:  Left hip fracture.  Left shoulder pain. EXAM: LEFT SHOULDER - 2+ VIEW COMPARISON:  09/07/2015. FINDINGS: Acromioclavicular and glenohumeral degenerative change. Diffuse osteopenia. No evidence of fracture or dislocation. IMPRESSION: Acromioclavicular and glenohumeral degenerative change. Diffuse osteopenia. No acute abnormality. Electronically Signed   By: Marcello Moores  Register   On: 12/09/2015 10:52   Dg Hip Unilat With Pelvis 2-3 Views Left  12/09/2015  CLINICAL DATA:  Pt fell today/recent left hip fracture with surgical repair in April 2017 Pain left hip  and left shoulder and lower back /htn/copd Pt unable to lay on left side due to increased pain in left hip EXAM: DG HIP (WITH OR WITHOUT PELVIS) 2-3V LEFT COMPARISON:  09/05/2015 FINDINGS: No acute fracture. Previous intertrochanteric fracture has been reduced, fixated, with a short intra medullary rod and a pen. The orthopedic hardware is well-seated and well-aligned and without significant change from the operative images. Left hip joint normally spaced and aligned. Bony pelvis appears intact.  Bones are demineralized. IMPRESSION: 1. No acute fracture or dislocation. 2. No evidence of loosening/disruption of the orthopedic hardware. Electronically Signed   By: Lajean Manes M.D.   On: 12/09/2015 10:54   Assessment/Plan Present on Admission:  . Persistent atrial fibrillation ([place]) . Essential thrombocythemia ([place]) . Essential hypertension . PVD (peripheral vascular disease) ([place]) . Atrial fibrillation with RVR ([place]) . Dyslipidemia . Paroxysmal a-fib ([place]) . Pressure ulcer . Moderate malnutrition (HCC)  PLAN:  Afib with RVR: Will transfer her to telemetry today.   Increase ambulation and follow HR. If her HR is acceptable, will d/c tomorrow.   HTN: Stable.  HLD: Continue with statin.   Other plans as per orders. Code Status: DNR.   Orvan Falconer, MD.  FACP [facility] Pager [phone] 7pm to 7am.  12/11/2015, 8:31 AM

## 2015-12-11 NOTE — Evaluation (Signed)
Physical Therapy Evaluation Patient Details Name: Theresa Bird MRN: [id number] DOB: 01/19/1934 Today's Date: 12/11/2015   History of Present Illness  Theresa Bird is an 82 y.o. female with hx of depression, anxiety, afib on Eliquis, hx of known CAD with several cardiac stents, last placed about 2 years ago, hx of DM, ET, COPD, myelofibrosis, presented to the ER after falling. She has no SOB, exertional CP, fever, chills or coughs. In the ER, she was found to have EKG with rate control afib, no ischemic or injurious changes, Xrays of the shoulder, LS spine, and pelvic films were all negative. She was started on IV Cardiazem, and hospitalist was asked to admit her for afib with RVR.  Clinical Impression  Pt states that she just got through with four months of SNF and was receiving HH at her daughters home.  She states the last month of SNF was self pay and she desires to go home with [facility] and not to SNF.  The therapist explained that this would be a significant amount of work for her daughter; pt verbalized understanding.     Follow Up Recommendations SNF;Home health PT    Equipment Recommendations  None recommended by PT    Recommendations for Other Services       Precautions / Restrictions Precautions Precautions: Fall Restrictions Weight Bearing Restrictions: No      Mobility  Bed Mobility Overal bed mobility: Needs Assistance Bed Mobility: Supine to Sit     Supine to sit: Mod assist        Transfers Overall transfer level: Needs assistance Equipment used: None Transfers: Stand Pivot Transfers   Stand pivot transfers: Mod assist          Ambulation/Gait     Pt is non ambulatory at this time                        Pertinent Vitals/Pain Pain Assessment: 0-10 Pain Score: 8  Pain Location: Lt heel Pain Descriptors / Indicators: Throbbing Pain Intervention(s): Repositioned    Home Living Family/patient expects to be discharged to:: Private  residence Living Arrangements: Children Available Help at Discharge: Family Type of Home: [place]: One [place]: Environmental consultant - 2 wheels;Cane - single point;Bedside commode;Wheelchair - manual      Prior Function Level of Independence: Needs assistance   Gait / Transfers Assistance Needed: mod  ADL's / Homemaking Assistance Needed: total           Extremity/Trunk Assessment               Lower Extremity Assessment: RLE deficits/detail;LLE deficits/detail RLE Deficits / Details: 2+/5 LLE Deficits / Details: Lt hip 2-/5; Lt knee 2/5     Communication   Communication: No difficulties  Cognition Arousal/Alertness: Awake/alert Behavior During Therapy: WFL for tasks assessed/performed Overall Cognitive Status: Within Functional Limits for tasks assessed                      General Comments      Exercises General Exercises - Lower Extremity Ankle Circles/Pumps: 10 reps;Both Quad Sets: 10 reps;Both Gluteal Sets: 10 reps;Both Heel Slides: 10 reps;Both Hip ABduction/ADduction: 10 reps;Both      Assessment/Plan    PT Assessment Patient needs continued PT services  PT Diagnosis Difficulty walking;Generalized weakness;Acute pain   PT Problem List Decreased strength;Decreased range of motion;Decreased activity tolerance;Decreased balance;Decreased mobility;Pain  PT Treatment Interventions Gait  training;Functional mobility training;Therapeutic activities;Therapeutic exercise;Balance training   PT Goals (Current goals can be found in the Care Plan section) Acute Rehab PT Goals PT Goal Formulation: With patient Time For Goal Achievement: 12/15/15 Potential to Achieve Goals: Fair    Frequency Min 3X/week   Barriers to discharge    mobility       End of Session Equipment Utilized During Treatment: Gait belt Activity Tolerance: Patient limited by fatigue Patient left: in chair;with nursing/sitter in room;with call bell/phone  within reach      Functional Limitation: Mobility: Walking and moving around Mobility: Walking and Moving Around Current Status [phone]): At least 80 percent but less than 100 percent impaired, limited or restricted Mobility: Walking and Moving Around Goal Status [phone]): At least 80 percent but less than 100 percent impaired, limited or restricted Mobility: Walking and Moving Around Discharge Status [phone]): At least 80 percent but less than 100 percent impaired, limited or restricted    Time: 0815-0900 PT Time Calculation (min) (ACUTE ONLY): 45 min   Charges:   PT Evaluation $PT Eval Moderate Complexity: 1 Procedure PT Treatments $Therapeutic Exercise: 8-22 mins   PT G Codes:   PT G-Codes **NOT FOR INPATIENT CLASS** Functional Limitation: Mobility: Walking and moving around Mobility: Walking and Moving Around Current Status [id number]): At least 80 percent but less than 100 percent impaired, limited or restricted Mobility: Walking and Moving Around Goal Status [phone]): At least 80 percent but less than 100 percent impaired, limited or restricted Mobility: Walking and Moving Around Discharge Status [phone]): At least 80 percent but less than 100 percent impaired, limited or restricted    Rayetta Humphrey, PT CLT [phone] 12/11/2015, 9:06 AM

## 2015-12-11 NOTE — Progress Notes (Signed)
Inpatient Diabetes Program Recommendations  AACE/ADA: New Consensus Statement on Inpatient Glycemic Control (2015)  Target Ranges:  Prepandial:   less than 140 mg/dL      Peak postprandial:   less than 180 mg/dL (1-2 hours)      Critically ill patients:  140 - 180 mg/dL   Results for MELY, STOLZMAN (MRN [id number]) as of 12/11/2015 08:21  Ref. Range 12/10/2015 07:39 12/10/2015 11:45 12/10/2015 16:28 12/10/2015 20:31  Glucose-Capillary Latest Ref Range: 65-99 mg/dL 167 (H) 269 (H) 146 (H) 244 (H)    Admit CP/ Afib w/ RVR.   History: DM, CAD, COPD  Home DM Meds: 70/30 Insulin- 15 units bidwc  Current Insulin Orders: Novolog Moderate Correction Scale/ SSI (0-15 units) TID AC + HS      MD- Please consider starting 50% of patient's home dose of 70/30 Insulin:  70/30 Insulin 8 units bid with meals    --Will follow patient during hospitalization--  Wyn Quaker RN, MSN, CDE Diabetes Coordinator Inpatient Glycemic Control Team Team Pager: [phone] (8a-5p)

## 2015-12-11 NOTE — Clinical Social Work Note (Signed)
Clinical Social Work Assessment  Patient Details  Name: Theresa Bird MRN: [id number] Date of Birth: [birth year]  Date of referral:  12/11/15               Reason for consult:  Facility Placement                Permission sought to share information with:    Permission granted to share information::     Name::        Agency::     Relationship::     Contact Information:     Housing/Transportation Living arrangements for the past 2 months:  Single Family Home Source of Information:  Patient Patient Interpreter Needed:  None Criminal Activity/Legal Involvement Pertinent to Current Situation/Hospitalization:  No - Comment as needed Significant Relationships:  Adult Children, Other Family Members Lives with:  Adult Children Do you feel safe going back to the place where you live?  Yes Need for family participation in patient care:  Yes (Comment)  Care giving concerns:  None identified. Has HHPT with [facility].   Social Worker assessment / plan: CSW spoke with patient about PT recommendation for SNF. Patient advised that she cannot afford go back to SNF and did not desire to go back to SNF. She stated that her daughter lives in the home with her. She advised that her granddaughter is also living in the home with them right now due to her daughter having a broken arm.  Patient advised that her daughter and/or granddaughter assists her with ADLs and that she ambulates with a walker at baseline.  CSW signing off.   Employment status:  Retired Forensic scientist:  Medicare PT Recommendations:  [place] / Referral to community resources:     Patient/Family's Response to care:  Patient is not agreeable to SNF. States that she will utilize HHPT. CSW notified CM of patient's desire for HHPT.  CM advised that patient was active with [facility] and that services would be continued.   Patient/Family's Understanding of and Emotional Response to Diagnosis, Current  Treatment, and Prognosis:  Patient understands her diagnosis, treatment and prognosis.  Emotional Assessment Appearance:  Appears stated age Attitude/Demeanor/Rapport:   (Cooperative) Affect (typically observed):  Calm Orientation:  Oriented to Self, Oriented to Place, Oriented to  Time, Oriented to Situation Alcohol / Substance use:  Not Applicable Psych involvement (Current and /or in the community):  No (Comment)  Discharge Needs  Concerns to be addressed:  Discharge Planning Concerns Readmission within the last 30 days:  No Current discharge risk:  None Barriers to Discharge:  No Barriers Identified   Ihor Gully, LCSW 12/11/2015, 11:29 AM

## 2015-12-12 ENCOUNTER — Inpatient Hospital Stay (HOSPITAL_COMMUNITY): Payer: Medicare Other

## 2015-12-12 LAB — GLUCOSE, CAPILLARY
GLUCOSE-CAPILLARY: 190 mg/dL — AB (ref 65–99)
GLUCOSE-CAPILLARY: 176 mg/dL — AB (ref 65–99)
GLUCOSE-CAPILLARY: 242 mg/dL — AB (ref 65–99)
Glucose-Capillary: 154 mg/dL — ABNORMAL HIGH (ref 65–99)

## 2015-12-12 NOTE — Progress Notes (Signed)
[facility] PROGRESS NOTE  Theresa Bird [id number] DOB: [birth year]    PCP:   Neale Burly, MD   HPI: Theresa Bird is an 82 y.o. female admitted for RVR with known afib on Eliquis, after falling without injury. Her rate is controlled on IV cardiazem. She is requesting her home percocet. Her HR is still up and down, transiently, asymptomatically. She has finally controlled of her afib, but she cannot walk. CT scan of her hip showed nondisplaced pelvic Fx, but her prior hip prosthesis was intact.    Rewiew of Systems:  Constitutional: Negative for malaise, fever and chills. No significant weight loss or weight gain Eyes: Negative for eye pain, redness and discharge, diplopia, visual changes, or flashes of light. ENMT: Negative for ear pain, hoarseness, nasal congestion, sinus pressure and sore throat. No headaches; tinnitus, drooling, or problem swallowing. Cardiovascular: Negative for chest pain, palpitations, diaphoresis, dyspnea and peripheral edema. ; No orthopnea, PND Respiratory: Negative for cough, hemoptysis, wheezing and stridor. No pleuritic chestpain. Gastrointestinal: Negative for nausea, vomiting, diarrhea, constipation, abdominal pain, melena, blood in stool, hematemesis, jaundice and rectal bleeding.    Genitourinary: Negative for frequency, dysuria, incontinence,flank pain and hematuria; Musculoskeletal: Negative for back pain and neck pain. Negative for swelling and trauma.;  Skin: . Negative for pruritus, rash, abrasions, bruising and skin lesion.; ulcerations Neuro: Negative for headache, lightheadedness and neck stiffness. Negative for weakness, altered level of consciousness , altered mental status, extremity weakness, burning feet, involuntary movement, seizure and syncope.  Psych: negative for anxiety, depression, insomnia, tearfulness, panic attacks, hallucinations, paranoia, suicidal or homicidal ideation    Past Medical History  Diagnosis Date  .  Depression   . Essential hypertension, benign   . Hypercholesteremia   . Insulin dependent diabetes mellitus ([place])   . Essential thrombocytosis (HCC)     JAK2 negative  . Coronary atherosclerosis of native coronary artery     Multivessel status post CABG in [place]  . Atrial fibrillation ([place])   . Myelofibrosis ([place])   . PAD (peripheral artery disease) ([place])   . Myocardial infarction ([place])   . Dysrhythmia     A FIB  . Seizure ([place])     NONE IN 15 YEARS  . COPD (chronic obstructive pulmonary disease) (HCC)     USES 2L O2  HS  . Shortness of breath dyspnea     W/ EXERTION   . Tuberculosis     PARENTS BOTH HAD TB   . History of kidney stones     Past Surgical History  Procedure Laterality Date  . Abdominal hysterectomy    . Cataract extraction    . Coronary artery bypass graft      Possibly 2002 in [place]  . Cardioversion N/A 09/09/2014    Procedure: CARDIOVERSION;  Surgeon: Josue Hector, MD;  [facility]: [facility] ENDOSCOPY;  Service: Cardiovascular;  Laterality: N/A;  . Cardioversion N/A 10/03/2014    Procedure: CARDIOVERSION;  Surgeon: Arnoldo Lenis, MD;  Location: [facility] ORS;  Service: Endoscopy;  Laterality: N/A;  . Peripheral vascular catheterization N/A 08/27/2015    Procedure: Abdominal Aortogram w/Lower Extremity;  Surgeon: Conrad [place], MD;  Location: [place] CV LAB;  Service: Cardiovascular;  Laterality: N/A;  . Intramedullary (im) nail intertrochanteric Left 09/05/2015    Procedure: INTRAMEDULLARY (IM) NAIL INTERTROCHANTRIC;  Surgeon: Newt Minion, MD;  Location: [place];  Service: Orthopedics;  Laterality: Left;  . Femoral-popliteal bypass graft Right 10/16/2015    Procedure: RIGHT BYPASS GRAFT FEMORAL-BELOW  KNEE POPLITEAL ARTERY USING 6 MM PROPATEN GORTEX GRAFT;  Surgeon: Serafina Mitchell, MD;  [facility]: [facility] OR;  Service: Vascular;  Laterality: Right;    Medications:  HOME MEDS: Prior to Admission medications   Medication Sig Start Date End Date Taking? Authorizing  Provider  acetaminophen (TYLENOL) 500 MG tablet Take 1 tablet (500 mg total) by mouth every 6 (six) hours as needed for mild pain. 09/05/15  Yes Newt Minion, MD  albuterol (PROAIR HFA) 108 (90 BASE) MCG/ACT inhaler Inhale 2 puffs into the lungs every 6 (six) hours as needed for wheezing or shortness of breath.   Yes Historical Provider, MD  aspirin 81 MG tablet Take 81 mg by mouth daily.   Yes Historical Provider, MD  atorvastatin (LIPITOR) 40 MG tablet Take 40 mg by mouth every evening.   Yes Historical Provider, MD  bisacodyl (DULCOLAX) 5 MG EC tablet Take 10 mg by mouth daily as needed for moderate constipation.   Yes Historical Provider, MD  calcium carbonate (OS-CAL) 600 MG tablet Take 1 tablet (600 mg total) by mouth 2 (two) times daily with a meal. 09/07/15  Yes Shela Leff, MD  cholecalciferol (VITAMIN D) 1000 units tablet Take 1 tablet (1,000 Units total) by mouth daily. 09/07/15  Yes Shela Leff, MD  collagenase (SANTYL) ointment Apply 1 application topically daily. Apply to left heel topically every day shift.   Yes Historical Provider, MD  diltiazem (CARTIA XT) 180 MG 24 hr capsule Take 1 capsule (180 mg total) by mouth daily. 09/14/15  Yes Satira Sark, MD  docusate sodium (COLACE) 100 MG capsule Take 100 mg by mouth 2 (two) times daily.   Yes Historical Provider, MD  fentaNYL (DURAGESIC - DOSED MCG/HR) 25 MCG/HR patch Place 25 mcg onto the skin every 3 (three) days.   Yes Historical Provider, MD  furosemide (LASIX) 20 MG tablet Take 20 mg by mouth daily as needed for fluid.    Yes Historical Provider, MD  hydroxyurea (HYDREA) 500 MG capsule Take 500 mg by mouth 2 (two) times daily.  04/14/14  Yes Historical Provider, MD  insulin aspart protamine - aspart (NOVOLOG MIX 70/30 FLEXPEN) (70-30) 100 UNIT/ML FlexPen Inject 15 Units into the skin 2 (two) times daily.   Yes Historical Provider, MD  Melatonin-Pyridoxine (MELATIN PO) Take 3 mg by mouth at bedtime.   Yes Historical  Provider, MD  metoprolol succinate (TOPROL-XL) 25 MG 24 hr tablet Take 25 mg by mouth daily.   Yes Historical Provider, MD  Multiple Vitamin (MULTIVITAMIN) tablet Take 1 tablet by mouth daily.   Yes Historical Provider, MD  ondansetron (ZOFRAN) 4 MG tablet Take 4 mg by mouth every 6 (six) hours as needed for nausea or vomiting.   Yes Historical Provider, MD  oxyCODONE-acetaminophen (PERCOCET/ROXICET) 5-325 MG tablet Take 1 tablet by mouth every 4 (four) hours as needed for severe pain. Patient taking differently: Take 1 tablet by mouth every 6 (six) hours as needed for moderate pain or severe pain.  08/27/15  Yes Samantha J Rhyne, PA-C  sertraline (ZOLOFT) 50 MG tablet Take 50 mg by mouth daily.   Yes Historical Provider, MD  traMADol (ULTRAM) 50 MG tablet Take 1 tablet (50 mg total) by mouth every 6 (six) hours as needed for moderate pain. 10/17/15  Yes Samantha J Rhyne, PA-C  vitamin C (ASCORBIC ACID) 500 MG tablet Take 500 mg by mouth 2 (two) times daily.   Yes Historical Provider, MD  apixaban (ELIQUIS) 2.5 MG TABS tablet  Take 1 tablet (2.5 mg total) by mouth 2 (two) times daily. Patient not taking: Reported on 12/09/2015 09/07/15   Shela Leff, MD  ciprofloxacin (CIPRO) 500 MG tablet Take 500 mg by mouth 2 (two) times daily. Reported on 12/09/2015    Historical Provider, MD  OXYGEN Inhale 2 L into the lungs every evening. And night for COPD    Historical Provider, MD     Allergies:  No Known Allergies  Social History:   reports that she has never smoked. She has never used smokeless tobacco. She reports that she drinks alcohol. She reports that she does not use illicit drugs.  Family History: Family History  Problem Relation Age of Onset  . Coronary artery disease Mother   . COPD Other      Physical Exam: Filed Vitals:   12/11/15 1237 12/11/15 2100 12/12/15 0500 12/12/15 1421  BP: 111/70 113/50 115/62   Pulse: 94 78 82   Temp: 98 F (36.7 C) 98 F (36.7 C) 97.9 F (36.6 C)    TempSrc: Oral Oral Oral   Resp: 18 18 18    Height:      Weight:      SpO2: 98% 96% 97% 94%   Blood pressure 115/62, pulse 82, temperature 97.9 F (36.6 C), temperature source Oral, resp. rate 18, height 5\' 9"  (1.753 m), weight 65.9 kg (145 lb 4.5 oz), SpO2 94 %.  GEN:  Pleasant  patient lying in the stretcher in no acute distress; cooperative with exam. PSYCH:  alert and oriented x4; does not appear anxious or depressed; affect is appropriate. HEENT: Mucous membranes pink and anicteric; PERRLA; EOM intact; no cervical lymphadenopathy nor thyromegaly or carotid bruit; no JVD; There were no stridor. Neck is very supple. Breasts:: Not examined CHEST WALL: No tenderness CHEST: Normal respiration, clear to auscultation bilaterally.  HEART: Regular rate and rhythm.  There are no murmur, rub, or gallops.   BACK: No kyphosis or scoliosis; no CVA tenderness ABDOMEN: soft and non-tender; no masses, no organomegaly, normal abdominal bowel sounds; no pannus; no intertriginous candida. There is no rebound and no distention. Rectal Exam: Not done EXTREMITIES: No bone or joint deformity; age-appropriate arthropathy of the hands and knees; no edema; no ulcerations.  There is no calf tenderness. Genitalia: not examined PULSES: 2+ and symmetric SKIN: Normal hydration no rash or ulceration CNS: Cranial nerves 2-12 grossly intact no focal lateralizing neurologic deficit.  Speech is fluent; uvula elevated with phonation, facial symmetry and tongue midline. DTR are normal bilaterally, cerebella exam is intact, barbinski is negative and strengths are equaled bilaterally.  No sensory loss.   Labs on Admission:  Basic Metabolic Panel:  Recent Labs Lab 12/09/15 1009 12/10/15 0409  NA 135 135  K 3.6 3.3*  CL 89* 91*  CO2 39* 37*  GLUCOSE 76 165*  BUN 12 11  CREATININE 0.65 0.48  CALCIUM 8.5* 8.2*    Recent Labs Lab 12/09/15 1009 12/10/15 0409  WBC 15.1* 15.7*  NEUTROABS 13.0*  --   HGB 11.9*  11.4*  HCT 37.4 36.2  MCV 121.0* 121.1*  PLT 604* 556*   CBG:  Recent Labs Lab 12/11/15 1711 12/11/15 2013 12/12/15 0728 12/12/15 1120 12/12/15 1608  GLUCAP 201* 200* 190* 154* 176*     Radiological Exams on Admission: Ct Hip Left Wo Contrast  12/12/2015  CLINICAL DATA:  [place] 12/09/2015. Left hip pain. Recent prior hip fracture and fixation in April 2017. EXAM: CT OF THE LEFT HIP WITHOUT CONTRAST TECHNIQUE: Multidetector  CT imaging of the left hip was performed according to the standard protocol. Multiplanar CT image reconstructions were also generated. COMPARISON:  Radiographs 12/09/2015 FINDINGS: There is a gamma nail and dynamic hip screw transfixing the remote intertrochanteric fracture. No complicating features associate with the hardware. No new fractures identified. Old avulsion fracture of the lesser trochanter. There are acute nondisplaced superior and inferior pubic rami fractures on the left side likely accounting for the patient's left hip pain. There is also subcutaneous edema/fluid which could be acute contusion. IMPRESSION: Intact fracture fixation hardware in the left hip. No new/acute hip fracture. Nondisplaced acute superior and inferior pubic rami fractures on the left side. Electronically Signed   By: Marijo Sanes M.D.   On: 12/12/2015 13:35   Assessment/Plan Present on Admission:  . Persistent atrial fibrillation ([place]) . Essential thrombocythemia ([place]) . Essential hypertension . PVD (peripheral vascular disease) ([place]) . Atrial fibrillation with RVR ([place]) . Dyslipidemia . Paroxysmal a-fib ([place]) . Pressure ulcer . Moderate malnutrition (HCC)  PLAN:  Afib with RVR:  Controlled.  Continue with Eliquis.  Pelvic Fx, non displaced:  Continue with pain meds.  She will need STR.  She is medical stable for discharge.   HTN:  Stable.  HLD:  Continue with statin.   Other plans as per orders. Code Status: DNR.    Orvan Falconer, MD.  FACP [facility] Pager  [phone] 7pm to 7am.  12/12/2015, 6:59 PM

## 2015-12-13 LAB — GLUCOSE, CAPILLARY
GLUCOSE-CAPILLARY: 352 mg/dL — AB (ref 65–99)
GLUCOSE-CAPILLARY: 104 mg/dL — AB (ref 65–99)
Glucose-Capillary: 174 mg/dL — ABNORMAL HIGH (ref 65–99)
Glucose-Capillary: 210 mg/dL — ABNORMAL HIGH (ref 65–99)

## 2015-12-13 NOTE — Progress Notes (Signed)
[facility] PROGRESS NOTE  Theresa Bird [id number] DOB: [birth year]    PCP:   Neale Burly, MD   HPI:  Theresa Bird is an 82 y.o. female admitted for RVR with known afib on Eliquis, after falling without injury. Her rate is controlled on IV cardiazem.She was subsequently transitioned to oral medication, and finally her HR was controlled.  PT was requested, but she had pain and has not been able to walk.  CT of the pelvis was done, showing that she has pelvic frame Fx.  She desired to be placed in a STR.   Constitutional: Negative for malaise, fever and chills. No significant weight loss or weight gain Eyes: Negative for eye pain, redness and discharge, diplopia, visual changes, or flashes of light. ENMT: Negative for ear pain, hoarseness, nasal congestion, sinus pressure and sore throat. No headaches; tinnitus, drooling, or problem swallowing. Cardiovascular: Negative for chest pain, palpitations, diaphoresis, dyspnea and peripheral edema. ; No orthopnea, PND Respiratory: Negative for cough, hemoptysis, wheezing and stridor. No pleuritic chestpain. Gastrointestinal: Negative for nausea, vomiting, diarrhea, constipation, abdominal pain, melena, blood in stool, hematemesis, jaundice and rectal bleeding.    Genitourinary: Negative for frequency, dysuria, incontinence,flank pain and hematuria; Musculoskeletal: Negative for back pain and neck pain.  Skin: . Negative for pruritus, rash, abrasions, bruising and skin lesion.; ulcerations Neuro: Negative for headache, lightheadedness and neck stiffness. Negative for weakness, altered level of consciousness , altered mental status, extremity weakness, burning feet, involuntary movement, seizure and syncope.  Psych: negative for anxiety, depression, insomnia, tearfulness, panic attacks, hallucinations, paranoia, suicidal or homicidal ideation   Past Medical History  Diagnosis Date  . Depression   . Essential hypertension, benign   .  Hypercholesteremia   . Insulin dependent diabetes mellitus ([place])   . Essential thrombocytosis (HCC)     JAK2 negative  . Coronary atherosclerosis of native coronary artery     Multivessel status post CABG in [place]  . Atrial fibrillation ([place])   . Myelofibrosis ([place])   . PAD (peripheral artery disease) ([place])   . Myocardial infarction ([place])   . Dysrhythmia     A FIB  . Seizure ([place])     NONE IN 15 YEARS  . COPD (chronic obstructive pulmonary disease) (HCC)     USES 2L O2  HS  . Shortness of breath dyspnea     W/ EXERTION   . Tuberculosis     PARENTS BOTH HAD TB   . History of kidney stones     Past Surgical History  Procedure Laterality Date  . Abdominal hysterectomy    . Cataract extraction    . Coronary artery bypass graft      Possibly 2002 in [place]  . Cardioversion N/A 09/09/2014    Procedure: CARDIOVERSION;  Surgeon: Josue Hector, MD;  Location: [facility] ENDOSCOPY;  Service: Cardiovascular;  Laterality: N/A;  . Cardioversion N/A 10/03/2014    Procedure: CARDIOVERSION;  Surgeon: Arnoldo Lenis, MD;  Location: [facility] ORS;  Service: Endoscopy;  Laterality: N/A;  . Peripheral vascular catheterization N/A 08/27/2015    Procedure: Abdominal Aortogram w/Lower Extremity;  Surgeon: Conrad Baudette, MD;  Location: [place] CV LAB;  Service: Cardiovascular;  Laterality: N/A;  . Intramedullary (im) nail intertrochanteric Left 09/05/2015    Procedure: INTRAMEDULLARY (IM) NAIL INTERTROCHANTRIC;  Surgeon: Newt Minion, MD;  Location: [place];  Service: Orthopedics;  Laterality: Left;  . Femoral-popliteal bypass graft Right 10/16/2015    Procedure: RIGHT BYPASS GRAFT FEMORAL-BELOW KNEE POPLITEAL  ARTERY USING 6 MM PROPATEN GORTEX GRAFT;  Surgeon: Serafina Mitchell, MD;  Location: [facility] OR;  Service: Vascular;  Laterality: Right;    Medications:  HOME MEDS: Prior to Admission medications   Medication Sig Start Date End Date Taking? Authorizing Provider  acetaminophen (TYLENOL) 500 MG tablet Take 1  tablet (500 mg total) by mouth every 6 (six) hours as needed for mild pain. 09/05/15  Yes Newt Minion, MD  albuterol (PROAIR HFA) 108 (90 BASE) MCG/ACT inhaler Inhale 2 puffs into the lungs every 6 (six) hours as needed for wheezing or shortness of breath.   Yes Historical Provider, MD  aspirin 81 MG tablet Take 81 mg by mouth daily.   Yes Historical Provider, MD  atorvastatin (LIPITOR) 40 MG tablet Take 40 mg by mouth every evening.   Yes Historical Provider, MD  bisacodyl (DULCOLAX) 5 MG EC tablet Take 10 mg by mouth daily as needed for moderate constipation.   Yes Historical Provider, MD  calcium carbonate (OS-CAL) 600 MG tablet Take 1 tablet (600 mg total) by mouth 2 (two) times daily with a meal. 09/07/15  Yes Shela Leff, MD  cholecalciferol (VITAMIN D) 1000 units tablet Take 1 tablet (1,000 Units total) by mouth daily. 09/07/15  Yes Shela Leff, MD  collagenase (SANTYL) ointment Apply 1 application topically daily. Apply to left heel topically every day shift.   Yes Historical Provider, MD  diltiazem (CARTIA XT) 180 MG 24 hr capsule Take 1 capsule (180 mg total) by mouth daily. 09/14/15  Yes Satira Sark, MD  docusate sodium (COLACE) 100 MG capsule Take 100 mg by mouth 2 (two) times daily.   Yes Historical Provider, MD  fentaNYL (DURAGESIC - DOSED MCG/HR) 25 MCG/HR patch Place 25 mcg onto the skin every 3 (three) days.   Yes Historical Provider, MD  furosemide (LASIX) 20 MG tablet Take 20 mg by mouth daily as needed for fluid.    Yes Historical Provider, MD  hydroxyurea (HYDREA) 500 MG capsule Take 500 mg by mouth 2 (two) times daily.  04/14/14  Yes Historical Provider, MD  insulin aspart protamine - aspart (NOVOLOG MIX 70/30 FLEXPEN) (70-30) 100 UNIT/ML FlexPen Inject 15 Units into the skin 2 (two) times daily.   Yes Historical Provider, MD  Melatonin-Pyridoxine (MELATIN PO) Take 3 mg by mouth at bedtime.   Yes Historical Provider, MD  metoprolol succinate (TOPROL-XL) 25 MG 24 hr  tablet Take 25 mg by mouth daily.   Yes Historical Provider, MD  Multiple Vitamin (MULTIVITAMIN) tablet Take 1 tablet by mouth daily.   Yes Historical Provider, MD  ondansetron (ZOFRAN) 4 MG tablet Take 4 mg by mouth every 6 (six) hours as needed for nausea or vomiting.   Yes Historical Provider, MD  oxyCODONE-acetaminophen (PERCOCET/ROXICET) 5-325 MG tablet Take 1 tablet by mouth every 4 (four) hours as needed for severe pain. Patient taking differently: Take 1 tablet by mouth every 6 (six) hours as needed for moderate pain or severe pain.  08/27/15  Yes Samantha J Rhyne, PA-C  sertraline (ZOLOFT) 50 MG tablet Take 50 mg by mouth daily.   Yes Historical Provider, MD  traMADol (ULTRAM) 50 MG tablet Take 1 tablet (50 mg total) by mouth every 6 (six) hours as needed for moderate pain. 10/17/15  Yes Samantha J Rhyne, PA-C  vitamin C (ASCORBIC ACID) 500 MG tablet Take 500 mg by mouth 2 (two) times daily.   Yes Historical Provider, MD  apixaban (ELIQUIS) 2.5 MG TABS tablet Take 1  tablet (2.5 mg total) by mouth 2 (two) times daily. Patient not taking: Reported on 12/09/2015 09/07/15   Shela Leff, MD  ciprofloxacin (CIPRO) 500 MG tablet Take 500 mg by mouth 2 (two) times daily. Reported on 12/09/2015    Historical Provider, MD  OXYGEN Inhale 2 L into the lungs every evening. And night for COPD    Historical Provider, MD     Allergies:  No Known Allergies  Social History:   reports that she has never smoked. She has never used smokeless tobacco. She reports that she drinks alcohol. She reports that she does not use illicit drugs.  Family History: Family History  Problem Relation Age of Onset  . Coronary artery disease Mother   . COPD Other      Physical Exam: Filed Vitals:   12/13/15 0633 12/13/15 1042 12/13/15 1254 12/13/15 1400  BP: 111/67 110/65 86/52 86/52   Pulse: 99 89  99  Temp: 98 F (36.7 C)   98.9 F (37.2 C)  TempSrc: Oral   Oral  Resp: 18   18  Height:      Weight:       SpO2: 99%   97%   Blood pressure 86/52, pulse 99, temperature 98.9 F (37.2 C), temperature source Oral, resp. rate 18, height 5\' 9"  (1.753 m), weight 65.9 kg (145 lb 4.5 oz), SpO2 97 %.  GEN:  Pleasant patient lying in the stretcher in no acute distress; cooperative with exam. PSYCH:  alert and oriented x4; does not appear anxious or depressed; affect is appropriate. HEENT: Mucous membranes pink and anicteric; PERRLA; EOM intact; no cervical lymphadenopathy nor thyromegaly or carotid bruit; no JVD; There were no stridor. Neck is very supple. Breasts:: Not examined CHEST WALL: No tenderness CHEST: Normal respiration, clear to auscultation bilaterally.  HEART: Regular rate and rhythm.  There are no murmur, rub, or gallops.   BACK: No kyphosis or scoliosis; no CVA tenderness ABDOMEN: soft and non-tender; no masses, no organomegaly, normal abdominal bowel sounds; no pannus; no intertriginous candida. There is no rebound and no distention. Rectal Exam: Not done EXTREMITIES: No bone or joint deformity; age-appropriate arthropathy of the hands and knees; no edema; no ulcerations.  There is no calf tenderness. Genitalia: not examined PULSES: 2+ and symmetric SKIN: Normal hydration no rash or ulceration CNS: Cranial nerves 2-12 grossly intact no focal lateralizing neurologic deficit.  Speech is fluent; uvula elevated with phonation, facial symmetry and tongue midline. DTR are normal bilaterally, cerebella exam is intact, barbinski is negative and strengths are equaled bilaterally.  No sensory loss.   Labs on Admission:  Basic Metabolic Panel:  Recent Labs Lab 12/09/15 1009 12/10/15 0409  NA 135 135  K 3.6 3.3*  CL 89* 91*  CO2 39* 37*  GLUCOSE 76 165*  BUN 12 11  CREATININE 0.65 0.48  CALCIUM 8.5* 8.2*    Recent Labs Lab 12/09/15 1009 12/10/15 0409  WBC 15.1* 15.7*  NEUTROABS 13.0*  --   HGB 11.9* 11.4*  HCT 37.4 36.2  MCV 121.0* 121.1*  PLT 604* 556*   CBG:  Recent  Labs Lab 12/12/15 1120 12/12/15 1608 12/12/15 2030 12/13/15 0716 12/13/15 1127  GLUCAP 154* 176* 242* 174* 352*     Radiological Exams on Admission: Ct Hip Left Wo Contrast  12/12/2015  CLINICAL DATA:  [place] 12/09/2015. Left hip pain. Recent prior hip fracture and fixation in April 2017. EXAM: CT OF THE LEFT HIP WITHOUT CONTRAST TECHNIQUE: Multidetector CT imaging of the left hip  was performed according to the standard protocol. Multiplanar CT image reconstructions were also generated. COMPARISON:  Radiographs 12/09/2015 FINDINGS: There is a gamma nail and dynamic hip screw transfixing the remote intertrochanteric fracture. No complicating features associate with the hardware. No new fractures identified. Old avulsion fracture of the lesser trochanter. There are acute nondisplaced superior and inferior pubic rami fractures on the left side likely accounting for the patient's left hip pain. There is also subcutaneous edema/fluid which could be acute contusion. IMPRESSION: Intact fracture fixation hardware in the left hip. No new/acute hip fracture. Nondisplaced acute superior and inferior pubic rami fractures on the left side. Electronically Signed   By: Marijo Sanes M.D.   On: 12/12/2015 13:35   Assessment/Plan Present on Admission:  . Persistent atrial fibrillation ([place]) . Essential thrombocythemia ([place]) . Essential hypertension . PVD (peripheral vascular disease) ([place]) . Atrial fibrillation with RVR ([place]) . Dyslipidemia . Paroxysmal a-fib ([place]) . Pressure ulcer . Moderate malnutrition (HCC)  PLAN:  Afib with RVR: Controlled. Continue with Eliquis.  Pelvic Fx, non displaced: Continue with pain meds. She will need STR. She is medical stable for discharge.   HTN: Stable.  HLD: Continue with statin.   Other plans as per orders. Code Status: DNR.    Orvan Falconer, MD.  FACP [facility] Pager [phone] 7pm to 7am.  12/13/2015, 4:17 PM

## 2015-12-14 LAB — GLUCOSE, CAPILLARY
GLUCOSE-CAPILLARY: 314 mg/dL — AB (ref 65–99)
Glucose-Capillary: 163 mg/dL — ABNORMAL HIGH (ref 65–99)

## 2015-12-14 MED ORDER — DILTIAZEM HCL ER COATED BEADS 240 MG PO CP24: 240.0000 mg | ORAL_CAPSULE | Freq: Every day | ORAL | Status: AC

## 2015-12-14 MED ORDER — OXYCODONE-ACETAMINOPHEN 5-325 MG PO TABS: 1.0000 | ORAL_TABLET | ORAL | Status: AC | PRN

## 2015-12-14 MED ORDER — FENTANYL 25 MCG/HR TD PT72: 25.0000 ug | MEDICATED_PATCH | TRANSDERMAL | Status: AC

## 2015-12-14 MED ORDER — BOOST / RESOURCE BREEZE PO LIQD: 1.0000 | Freq: Three times a day (TID) | ORAL | Status: AC

## 2015-12-14 NOTE — Final Progress Note (Signed)
Late entry patient transferred to [facility] via [facility] via stretcher. She was dressed prior to d/c and her IV was removed.  She left the floor in stable condition.

## 2015-12-14 NOTE — NC FL2 (Signed)
[place] LEVEL OF CARE SCREENING TOOL     IDENTIFICATION  Patient Name: Theresa Bird Birthdate: [birth year] Sex: female Admission Date (Current Location): 12/09/2015  [facility] and [place] Number:  [place] [facility]  ([id number]) Facility and Address:  [facility] [address], [place]      Provider Number: [id number]  Attending Physician Name and Address:  Orvan Falconer, MD  Relative Name and Phone Number:  Mariann Laster, daughter, [phone]    Current Level of Care: Hospital Recommended Level of Care: [place] Prior Approval Number:    Date Approved/Denied:   PASRR Number:  ([id number])  Discharge Plan: SNF    Current Diagnoses: Patient Active Problem List   Diagnosis Date Noted  . Malnutrition of moderate degree 12/11/2015  . Moderate malnutrition ([place]) 12/11/2015  . Paroxysmal a-fib ([place]) 12/09/2015  . Atherosclerosis of native artery of right lower extremity with ulceration of midfoot ([place]) 11/16/2015  . Aftercare following surgery of the circulatory system 11/16/2015  . Persistent atrial fibrillation ([place])   . Hip fracture, left ([place])   . PVD (peripheral vascular disease) ([place])   . Diabetic ulcer of right foot associated with diabetes mellitus due to underlying condition ([place])   . Closed fracture of left hip ([place]) 09/04/2015  . Pressure ulcer 08/27/2015  . PAD (peripheral artery disease) ([place]) 08/26/2015  . Seizure ([place]) 01/26/2015  . Atrial fibrillation with rapid ventricular response ([place]) 01/26/2015  . Phlebitis   . Screen for STD (sexually transmitted disease)   . Essential thrombocythemia ([place])   . Myelofibrosis ([place])   . UTI (urinary tract infection) 09/08/2014  . Atrial fibrillation with RVR ([place]) 09/07/2014  . Cellulitis of left upper arm 09/07/2014  . Paroxysmal atrial fibrillation ([place]) 04/08/2013  . Essential hypertension 04/08/2013  . CAFL (chronic airflow limitation) ([place]) 05/11/2012  . Essential  hemorrhagic thrombocythemia ([place]) 05/11/2012  . Clinical depression 07/13/2011  . Diabetes ([place]) 07/13/2011  . DM2 (diabetes mellitus, type 2) ([place]) 06/19/2009  . Dyslipidemia 06/19/2009  . CORONARY ATHEROSCLEROSIS NATIVE CORONARY ARTERY 06/19/2009    Orientation RESPIRATION BLADDER Height & Weight     Self, Time, Situation, Place  Normal Continent Weight: 145 lb 4.5 oz (65.9 kg) Height:  5\' 9"  (175.3 cm)  BEHAVIORAL SYMPTOMS/MOOD NEUROLOGICAL BOWEL NUTRITION STATUS      Continent Diet (Diet Carb Modified)  AMBULATORY STATUS COMMUNICATION OF NEEDS Skin   Extensive Assist Verbally  (Unstagable right lateral foot)                       Personal Care Assistance Level of Assistance  Bathing, Feeding, Dressing Bathing Assistance: Maximum assistance Feeding assistance: Limited assistance Dressing Assistance: Maximum assistance     Functional Limitations Info  Sight, Hearing, Speech Sight Info: Adequate Hearing Info: Adequate Speech Info: Adequate    SPECIAL CARE FACTORS FREQUENCY  PT (By licensed PT)     PT Frequency: 5x/week              Contractures      Additional Factors Info  Code Status, Insulin Sliding Scale, Psychotropic Code Status Info: DNR   Psychotropic Info: Zoloft         Current Medications (12/14/2015):  This is the current hospital active medication list Current Facility-Administered Medications  Medication Dose Route Frequency Provider Last Rate Last Dose  . albuterol (PROVENTIL) (2.5 MG/3ML) 0.083% nebulizer solution 2.5 mg  2.5 mg Nebulization Q6H PRN Orvan Falconer, MD      .  apixaban (ELIQUIS) tablet 2.5 mg  2.5 mg Oral BID Orvan Falconer, MD   2.5 mg at 12/14/15 1009  . aspirin EC tablet 81 mg  81 mg Oral Daily Orvan Falconer, MD   81 mg at 12/14/15 1009  . atorvastatin (LIPITOR) tablet 40 mg  40 mg Oral QPM Orvan Falconer, MD   40 mg at 12/13/15 1722  . bisacodyl (DULCOLAX) EC tablet 10 mg  10 mg Oral Daily PRN Orvan Falconer, MD      . calcium carbonate (OS-CAL  - dosed in mg of elemental calcium) tablet 500 mg of elemental calcium  1 tablet Oral BID WC Orvan Falconer, MD   500 mg of elemental calcium at 12/14/15 1009  . cholecalciferol (VITAMIN D) tablet 1,000 Units  1,000 Units Oral Daily Orvan Falconer, MD   1,000 Units at 12/14/15 1009  . collagenase (SANTYL) ointment 1 application  1 application Topical Daily Orvan Falconer, MD   1 application at [id number]  . diltiazem (CARDIZEM CD) 24 hr capsule 240 mg  240 mg Oral Daily Orvan Falconer, MD   240 mg at 12/14/15 1009  . docusate sodium (COLACE) capsule 100 mg  100 mg Oral BID Orvan Falconer, MD   100 mg at 12/14/15 1009  . feeding supplement (BOOST / RESOURCE BREEZE) liquid 1 Container  1 Container Oral TID BM Orvan Falconer, MD   1 Container at 12/13/15 2000  . fentaNYL (DURAGESIC - dosed mcg/hr) patch 25 mcg  25 mcg Transdermal Q72H Orvan Falconer, MD   25 mcg at 12/12/15 1647  . hydroxyurea (HYDREA) capsule 500 mg  500 mg Oral BID Orvan Falconer, MD   500 mg at 12/14/15 1009  . insulin aspart (novoLOG) injection 0-15 Units  0-15 Units Subcutaneous TID WC Orvan Falconer, MD   3 Units at 12/14/15 0810  . insulin aspart (novoLOG) injection 0-5 Units  0-5 Units Subcutaneous QHS Orvan Falconer, MD   2 Units at 12/13/15 2149  . metoprolol succinate (TOPROL-XL) 24 hr tablet 25 mg  25 mg Oral Daily Orvan Falconer, MD   25 mg at 12/14/15 1010  . multivitamin with minerals tablet 1 tablet  1 tablet Oral Daily Orvan Falconer, MD   1 tablet at 12/14/15 1010  . ondansetron (ZOFRAN) tablet 4 mg  4 mg Oral Q6H PRN Orvan Falconer, MD       Or  . ondansetron [facility]) injection 4 mg  4 mg Intravenous Q6H PRN Orvan Falconer, MD      . oxyCODONE-acetaminophen (PERCOCET/ROXICET) 5-325 MG per tablet 1-2 tablet  1-2 tablet Oral Q4H PRN Orvan Falconer, MD   1 tablet at 12/14/15 1010  . sertraline (ZOLOFT) tablet 50 mg  50 mg Oral Daily Orvan Falconer, MD   50 mg at 12/14/15 1010  . sodium chloride flush (NS) 0.9 % injection 3 mL  3 mL Intravenous Q12H Orvan Falconer, MD   3 mL at 12/13/15 2151  . vitamin C (ASCORBIC  ACID) tablet 500 mg  500 mg Oral BID Orvan Falconer, MD   500 mg at 12/14/15 1009     Discharge Medications: Please see discharge summary for a list of discharge medications.  Relevant Imaging Results:  Relevant Lab Results:   Additional Information SSN [id number]  Ihor Gully, LCSW

## 2015-12-14 NOTE — Care Management Important Message (Signed)
Important Message  Patient Details  Name: NIAMBI HYETT MRN: [id number] Date of Birth: [birth year]   Medicare Important Message Given:  Yes    Sherald Barge, RN 12/14/2015, 12:27 PM

## 2015-12-14 NOTE — Discharge Summary (Signed)
Physician Discharge Summary  Theresa Bird [id number] DOB: 1934-04-05 DOA: 12/09/2015  PCP: Theresa Burly, MD  Admit date: 12/09/2015 Discharge date: 12/14/2015  Time spent: 35 minutes  Recommendations for Outpatient Follow-up:  1. Follow up with PCP next week. 2. Follow up with Cardiology as scheduled.    Discharge Diagnoses:  Principal Problem:   Atrial fibrillation with RVR ([place]) Active Problems:   Dyslipidemia   Essential hypertension   Diabetes (HCC)   Essential thrombocythemia (HCC)   Seizure (HCC)   Pressure ulcer   PVD (peripheral vascular disease) ([place])   Persistent atrial fibrillation (HCC)   Paroxysmal a-fib (HCC)   Malnutrition of moderate degree   Moderate malnutrition ([place])   Discharge Condition: Improved. Pain on hip has improved. HR is controlled with activities.   Diet recommendation: Heart healthy.  Filed Weights   12/09/15 0937 12/10/15 0500  Weight: 65.318 kg (144 lb) 65.9 kg (145 lb 4.5 oz)    History of present illness: patient had a fall and hurt her left hip.  She was admitted for afib with RVR by me on December 09, 2015.  As per my H and P:  " Theresa Bird is an 82 y.o. female with hx of depression, anxiety, afib on Eliquis, hx of known CAD with several cardiac stents, last placed about 2 years ago, hx of DM, ET, COPD, myelofibrosis, presented to the ER after falling. She has no SOB, exertional CP, fever, chills or coughs. In the ER, she was found to have EKG with rate control afib, no ischemic or injurious changes, Xrays of the shoulder, LS spine, and pelvic films were all negative. She was started on IV Cardiazem, and hospitalist was asked to admit her for afib with RVR.   Hospital Course:  Theresa Bird is an 82 y.o. female admitted for RVR with known afib on Eliquis, after falling without injury. Her rate was controlled on IV cardiazem.She was subsequently transferred to the floor,  and she  was subsequently transitioned to oral  medication, and finally her HR was controlled with increasing dose of Cardiazem.  Her anticoagulation was continued.   PT was requested, but she had pain and has not been able to walk.She was going to be discharged home, but she could not walk, so CT of the pelvis was done, showing that she has pelvic frame Fx.It doesn't require surgical intervention, and her prior prosthesis was intact.  She desired to be placed in a STR, arranged by CM, and she has an available bed today.  She is stable for discharge, and will be discharged to SNF today.  Her Code status remained DNR, and she should see PCP in one week, along with routine follow up with cardiology as scheduled.  Thank you and Good Day.    Discharge Exam: Filed Vitals:   12/13/15 2147 12/14/15 0612  BP: 99/71 112/71  Pulse: 103 142  Temp: 98.1 F (36.7 C) 98.5 F (36.9 C)  Resp: 18 18    Discharge Instructions   Discharge Instructions    Diet - low sodium heart healthy    Complete by:  As directed      Discharge instructions    Complete by:  As directed   Follow up with your PCP next week.  See your cardiologist as scheduled.     Increase activity slowly    Complete by:  As directed           Current Discharge Medication List  START taking these medications   Details  feeding supplement (BOOST / RESOURCE BREEZE) LIQD Take 1 Container by mouth 3 (three) times daily between meals. Qty: 30 Container, Refills: 1      CONTINUE these medications which have CHANGED   Details  diltiazem (CARDIZEM CD) 240 MG 24 hr capsule Take 1 capsule (240 mg total) by mouth daily. Qty: 30 capsule, Refills: 2    fentaNYL (DURAGESIC - DOSED MCG/HR) 25 MCG/HR patch Place 1 patch (25 mcg total) onto the skin every 3 (three) days. Qty: 5 patch, Refills: 0    oxyCODONE-acetaminophen (PERCOCET/ROXICET) 5-325 MG tablet Take 1-2 tablets by mouth every 4 (four) hours as needed for moderate pain or severe pain. Qty: 30 tablet, Refills: 0       CONTINUE these medications which have NOT CHANGED   Details  acetaminophen (TYLENOL) 500 MG tablet Take 1 tablet (500 mg total) by mouth every 6 (six) hours as needed for mild pain. Qty: 30 tablet, Refills: 0    albuterol (PROAIR HFA) 108 (90 BASE) MCG/ACT inhaler Inhale 2 puffs into the lungs every 6 (six) hours as needed for wheezing or shortness of breath.    aspirin 81 MG tablet Take 81 mg by mouth daily.    atorvastatin (LIPITOR) 40 MG tablet Take 40 mg by mouth every evening.    bisacodyl (DULCOLAX) 5 MG EC tablet Take 10 mg by mouth daily as needed for moderate constipation.    calcium carbonate (OS-CAL) 600 MG tablet Take 1 tablet (600 mg total) by mouth 2 (two) times daily with a meal. Qty: 60 tablet, Refills: 0    cholecalciferol (VITAMIN D) 1000 units tablet Take 1 tablet (1,000 Units total) by mouth daily. Qty: 30 tablet, Refills: 0    collagenase (SANTYL) ointment Apply 1 application topically daily. Apply to left heel topically every day shift.    docusate sodium (COLACE) 100 MG capsule Take 100 mg by mouth 2 (two) times daily.    furosemide (LASIX) 20 MG tablet Take 20 mg by mouth daily as needed for fluid.     hydroxyurea (HYDREA) 500 MG capsule Take 500 mg by mouth 2 (two) times daily.     insulin aspart protamine - aspart (NOVOLOG MIX 70/30 FLEXPEN) (70-30) 100 UNIT/ML FlexPen Inject 15 Units into the skin 2 (two) times daily.    Melatonin-Pyridoxine (MELATIN PO) Take 3 mg by mouth at bedtime.    metoprolol succinate (TOPROL-XL) 25 MG 24 hr tablet Take 25 mg by mouth daily.    Multiple Vitamin (MULTIVITAMIN) tablet Take 1 tablet by mouth daily.    sertraline (ZOLOFT) 50 MG tablet Take 50 mg by mouth daily.    vitamin C (ASCORBIC ACID) 500 MG tablet Take 500 mg by mouth 2 (two) times daily.    apixaban (ELIQUIS) 2.5 MG TABS tablet Take 1 tablet (2.5 mg total) by mouth 2 (two) times daily. Qty: 60 tablet, Refills: 0    OXYGEN Inhale 2 L into the lungs every  evening. And night for COPD      STOP taking these medications     ondansetron (ZOFRAN) 4 MG tablet      traMADol (ULTRAM) 50 MG tablet      ciprofloxacin (CIPRO) 500 MG tablet        No Known Allergies Follow-up Information    Follow up with [facility] .   Specialty:  [place] information:   [address]  [address] [phone]       The results of significant diagnostics from this hospitalization (including imaging, microbiology, ancillary and laboratory) are listed below for reference.    Significant Diagnostic Studies: Dg Chest 1 View  12/09/2015  CLINICAL DATA:  Fall with pain. Initial encounter. EXAM: CHEST 1 VIEW COMPARISON:  09/07/2015 FINDINGS: Peripheral indistinct airspace opacity at the right base. There is no edema, effusion, or pneumothorax. No cardiomegaly. Status post CABG. Left basilar density attributed mitral annular calcification. IMPRESSION: Small nonspecific opacity at the right base. In the setting of trauma favor aspiration or atelectasis. No neighboring rib fracture. Correlate for infectious symptoms. Electronically Signed   By: Monte Fantasia M.D.   On: 12/09/2015 10:52   Dg Lumbar Spine Complete  12/09/2015  CLINICAL DATA:  Fall with lower back pain.  Initial encounter. EXAM: LUMBAR SPINE - COMPLETE 4+ VIEW COMPARISON:  02/25/2010 abdominal CT FINDINGS: L2 and L4 superior endplate fractures without visible/unhealed fracture line. Height loss at L4 approaches 50%. No traumatic malalignment. Advanced lower lumbar facet arthropathy. L5-S1 degenerative disc narrowing. Osteopenia and extensive atherosclerosis. Numerous right renal calculi IMPRESSION: L2 and L4 compression fractures that are favored chronic. No definitive acute injury. Electronically Signed   By: Monte Fantasia M.D.   On: 12/09/2015 10:57   Ct Hip Left Wo Contrast  12/12/2015  CLINICAL DATA:  [place] 12/09/2015. Left hip pain. Recent  prior hip fracture and fixation in April 2017. EXAM: CT OF THE LEFT HIP WITHOUT CONTRAST TECHNIQUE: Multidetector CT imaging of the left hip was performed according to the standard protocol. Multiplanar CT image reconstructions were also generated. COMPARISON:  Radiographs 12/09/2015 FINDINGS: There is a gamma nail and dynamic hip screw transfixing the remote intertrochanteric fracture. No complicating features associate with the hardware. No new fractures identified. Old avulsion fracture of the lesser trochanter. There are acute nondisplaced superior and inferior pubic rami fractures on the left side likely accounting for the patient's left hip pain. There is also subcutaneous edema/fluid which could be acute contusion. IMPRESSION: Intact fracture fixation hardware in the left hip. No new/acute hip fracture. Nondisplaced acute superior and inferior pubic rami fractures on the left side. Electronically Signed   By: Marijo Sanes M.D.   On: 12/12/2015 13:35   Dg Shoulder Left  12/09/2015  CLINICAL DATA:  Left hip fracture.  Left shoulder pain. EXAM: LEFT SHOULDER - 2+ VIEW COMPARISON:  09/07/2015. FINDINGS: Acromioclavicular and glenohumeral degenerative change. Diffuse osteopenia. No evidence of fracture or dislocation. IMPRESSION: Acromioclavicular and glenohumeral degenerative change. Diffuse osteopenia. No acute abnormality. Electronically Signed   By: Marcello Moores  Register   On: 12/09/2015 10:52   Dg Hip Unilat With Pelvis 2-3 Views Left  12/09/2015  CLINICAL DATA:  Pt fell today/recent left hip fracture with surgical repair in April 2017 Pain left hip and left shoulder and lower back /htn/copd Pt unable to lay on left side due to increased pain in left hip EXAM: DG HIP (WITH OR WITHOUT PELVIS) 2-3V LEFT COMPARISON:  09/05/2015 FINDINGS: No acute fracture. Previous intertrochanteric fracture has been reduced, fixated, with a short intra medullary rod and a pen. The orthopedic hardware is well-seated and  well-aligned and without significant change from the operative images. Left hip joint normally spaced and aligned. Bony pelvis appears intact.  Bones are demineralized. IMPRESSION: 1. No acute fracture or dislocation. 2. No evidence of loosening/disruption of the orthopedic hardware. Electronically Signed   By: Lajean Manes M.D.   On: 12/09/2015 10:54    Microbiology: Recent  Results (from the past 240 hour(s))  MRSA PCR Screening     Status: None   Collection Time: 12/09/15  3:18 PM  Result Value Ref Range Status   MRSA by PCR NEGATIVE NEGATIVE Final    Comment:        The GeneXpert MRSA Assay (FDA approved for NASAL specimens only), is one component of a comprehensive MRSA colonization surveillance program. It is not intended to diagnose MRSA infection nor to guide or monitor treatment for MRSA infections.      Labs: Basic Metabolic Panel:  Recent Labs Lab 12/09/15 1009 12/10/15 0409  NA 135 135  K 3.6 3.3*  CL 89* 91*  CO2 39* 37*  GLUCOSE 76 165*  BUN 12 11  CREATININE 0.65 0.48  CALCIUM 8.5* 8.2*   CBC:  Recent Labs Lab 12/09/15 1009 12/10/15 0409  WBC 15.1* 15.7*  NEUTROABS 13.0*  --   HGB 11.9* 11.4*  HCT 37.4 36.2  MCV 121.0* 121.1*  PLT 604* 556*    CBG:  Recent Labs Lab 12/13/15 1127 12/13/15 1639 12/13/15 2135 12/14/15 0729 12/14/15 1117  GLUCAP 352* 104* 210* 163* 314*   Signed:  Torrie Lafavor MD. Rosalita Chessman.  [facility] 12/14/2015, 3:47 PM

## 2015-12-14 NOTE — Progress Notes (Signed)
Inpatient Diabetes Program Recommendations  AACE/ADA: New Consensus Statement on Inpatient Glycemic Control (2015)  Target Ranges:  Prepandial:   less than 140 mg/dL      Peak postprandial:   less than 180 mg/dL (1-2 hours)      Critically ill patients:  140 - 180 mg/dL   Results for Theresa Bird, Theresa Bird (MRN [id number]) as of 12/14/2015 10:35  Ref. Range 12/13/2015 07:16 12/13/2015 11:27 12/13/2015 16:39 12/13/2015 21:35  Glucose-Capillary Latest Ref Range: 65-99 mg/dL 174 (H) 352 (H) 104 (H) 210 (H)    Admit CP/ Afib w/ RVR.   History: DM, CAD, COPD  Home DM Meds: 70/30 Insulin- 15 units bidwc  Current Insulin Orders: Novolog Moderate Correction Scale/ SSI (0-15 units) TID AC + HS      MD- Please consider starting 50% of patient's home dose of 70/30 Insulin:  70/30 Insulin 8 units bid with meals    --Will follow patient during hospitalization--  Wyn Quaker RN, MSN, CDE Diabetes Coordinator Inpatient Glycemic Control Team Team Pager: [phone] (8a-5p)

## 2015-12-14 NOTE — Care Management Note (Signed)
Case Management Note  Patient Details  Name: Theresa Bird MRN: [id number] Date of Birth: 1933-12-12   Expected Discharge Date:     12/14/2015             Expected Discharge Plan:  [place]  In-House Referral:  Clinical Social Work  Discharge planning Services  CM Consult  Post Acute Care Choice:  NA Choice offered to:  NA  DME Arranged:    DME Agency:     HH Arranged:    [place] Agency:     Status of Service:  Completed, signed off  If discussed at [employer], dates discussed:    Additional Comments: Pt now agreeable to DC to SNF. CSW is aware and will make arrangements. CM will notify [facility] of updated DC plan. Anticipate DC today.   Sherald Barge, RN 12/14/2015, 12:46 PM

## 2015-12-14 NOTE — Clinical Social Work Note (Signed)
Patient and daughter advised that they had reconsidered and desired to go to [facility]. CSW faxed Clinicals and patient was given a bed offer.  CSW advised both patient's daughter and patient that patient was discharging and would be transported to facility via [facility].   CSW notified Cleon Dew at facility that patient was being discharged and would be transported to facility today.   CSW spoke with Dr. Marin Comment and advised that patient had a bed at [facility]. CSW discussed that patient's discharge would need to be completed by 4:00 p.m. CSW had attending sign patient's DNR.  CSW arranged for transportation.  CSW signing off.     Ahuva Poynor, Clydene Pugh, LCSW

## 2015-12-14 NOTE — Progress Notes (Signed)
Report was given to Job Founds at the [facility].  She verbalized understanding.

## 2015-12-18 ENCOUNTER — Encounter: Payer: Self-pay | Admitting: Cardiology

## 2015-12-18 ENCOUNTER — Ambulatory Visit (INDEPENDENT_AMBULATORY_CARE_PROVIDER_SITE_OTHER): Payer: Medicare Other | Admitting: Cardiology

## 2015-12-18 VITALS — BP 105/71 | HR 119 | Ht 69.0 in | Wt 144.0 lb

## 2015-12-18 DIAGNOSIS — I2510 Atherosclerotic heart disease of native coronary artery without angina pectoris: Secondary | ICD-10-CM

## 2015-12-18 DIAGNOSIS — I10 Essential (primary) hypertension: Secondary | ICD-10-CM | POA: Diagnosis not present

## 2015-12-18 DIAGNOSIS — I482 Chronic atrial fibrillation, unspecified: Secondary | ICD-10-CM

## 2015-12-18 DIAGNOSIS — E785 Hyperlipidemia, unspecified: Secondary | ICD-10-CM

## 2015-12-18 MED ORDER — METOPROLOL SUCCINATE ER 25 MG PO TB24: 25.0000 mg | ORAL_TABLET | Freq: Two times a day (BID) | ORAL | Status: AC

## 2015-12-18 NOTE — Progress Notes (Signed)
Cardiology Office Note  Date: 12/18/2015   ID: DIONNE WRITE, DOB 1933/07/02, MRN [id number]  PCP: Neale Burly, MD  Primary Cardiologist: Rozann Lesches, MD   Chief Complaint  Patient presents with  . Atrial Fibrillation  . Coronary Artery Disease    History of Present Illness: Theresa Bird is a medically complex 82 y.o. female last seen in April prior to vascular surgery with Dr. Trula Slade. She underwent successful right femoral to below the knee popliteal bypass grafting with Gore-Tex in May.  [facility] records indicate hospitalization recently in July after a fall, noted to have rapid atrial fibrillation which required transient use of intravenous Cardizem for rate control. She was transitioned back to her oral regimen. She was noted to have a pelvic frame fracture that did not require surgical intervention. She was discharged to a skilled nursing facility.  She is staying in the [facility] at this time undergoing rehabilitation. She she just recently started at that facility. I reviewed her medications which are outlined below. We discussed having her stop aspirin since she is on Eliquis to reduce bleeding risk. Also further increasing her Toprol-XL to try and obtain better control of her atrial fibrillation.  I reviewed her ECG today which showed rapid atrial fibrillation in the 120s. She is not specifically aware of any palpitations or chest pain.  Past Medical History  Diagnosis Date  . Depression   . Essential hypertension, benign   . Hypercholesteremia   . Insulin dependent diabetes mellitus ([place])   . Essential thrombocytosis (HCC)     JAK2 negative  . Coronary atherosclerosis of native coronary artery     Multivessel status post CABG in [place]  . Atrial fibrillation ([place])   . Myelofibrosis ([place])   . PAD (peripheral artery disease) ([place])   . Myocardial infarction ([place])   . History of seizures   . COPD (chronic obstructive pulmonary disease) (HCC)    Supplemental oxygen  . History of kidney stones     Past Surgical History  Procedure Laterality Date  . Abdominal hysterectomy    . Cataract extraction    . Coronary artery bypass graft      Possibly 2002 in [place]  . Cardioversion N/A 09/09/2014    Procedure: CARDIOVERSION;  Surgeon: Josue Hector, MD;  Location: [facility] ENDOSCOPY;  Service: Cardiovascular;  Laterality: N/A;  . Cardioversion N/A 10/03/2014    Procedure: CARDIOVERSION;  Surgeon: Arnoldo Lenis, MD;  Location: [facility] ORS;  Service: Endoscopy;  Laterality: N/A;  . Peripheral vascular catheterization N/A 08/27/2015    Procedure: Abdominal Aortogram w/Lower Extremity;  Surgeon: Conrad [place], MD;  Location: [facility] CV LAB;  Service: Cardiovascular;  Laterality: N/A;  . Intramedullary (im) nail intertrochanteric Left 09/05/2015    Procedure: INTRAMEDULLARY (IM) NAIL INTERTROCHANTRIC;  Surgeon: Newt Minion, MD;  Location: [place];  Service: Orthopedics;  Laterality: Left;  . Femoral-popliteal bypass graft Right 10/16/2015    Procedure: RIGHT BYPASS GRAFT FEMORAL-BELOW KNEE POPLITEAL ARTERY USING 6 MM PROPATEN GORTEX GRAFT;  Surgeon: Serafina Mitchell, MD;  Location: [facility] OR;  Service: Vascular;  Laterality: Right;    Current Outpatient Prescriptions  Medication Sig Dispense Refill  . acetaminophen (TYLENOL) 500 MG tablet Take 1 tablet (500 mg total) by mouth every 6 (six) hours as needed for mild pain. 30 tablet 0  . albuterol (PROAIR HFA) 108 (90 BASE) MCG/ACT inhaler Inhale 2 puffs into the lungs every 6 (six) hours as needed for wheezing or shortness  of breath.    Theresa Bird Kitchen apixaban (ELIQUIS) 2.5 MG TABS tablet Take 2.5 mg by mouth 2 (two) times daily.    Theresa Bird Kitchen atorvastatin (LIPITOR) 40 MG tablet Take 40 mg by mouth every evening.    . bisacodyl (DULCOLAX) 5 MG EC tablet Take 10 mg by mouth daily as needed for moderate constipation.    . calcium carbonate (OS-CAL) 600 MG tablet Take 1 tablet (600 mg total) by mouth 2 (two) times daily with a  meal. 60 tablet 0  . cholecalciferol (VITAMIN D) 1000 units tablet Take 1 tablet (1,000 Units total) by mouth daily. 30 tablet 0  . collagenase (SANTYL) ointment Apply 1 application topically daily. Apply to left heel topically every day shift.    . diltiazem (CARDIZEM CD) 240 MG 24 hr capsule Take 1 capsule (240 mg total) by mouth daily. 30 capsule 2  . docusate sodium (COLACE) 100 MG capsule Take 100 mg by mouth 2 (two) times daily.    . feeding supplement (BOOST / RESOURCE BREEZE) LIQD Take 1 Container by mouth 3 (three) times daily between meals. 30 Container 1  . fentaNYL (DURAGESIC - DOSED MCG/HR) 25 MCG/HR patch Place 1 patch (25 mcg total) onto the skin every 3 (three) days. 5 patch 0  . furosemide (LASIX) 20 MG tablet Take 20 mg by mouth daily.     . hydroxyurea (HYDREA) 500 MG capsule Take 500 mg by mouth 2 (two) times daily.     . insulin aspart protamine - aspart (NOVOLOG MIX 70/30 FLEXPEN) (70-30) 100 UNIT/ML FlexPen Inject 15 Units into the skin 2 (two) times daily.    . Melatonin-Pyridoxine (MELATIN PO) Take 3 mg by mouth at bedtime.    . metoprolol succinate (TOPROL-XL) 25 MG 24 hr tablet Take 1 tablet (25 mg total) by mouth 2 (two) times daily. 60 tablet 6  . Multiple Vitamin (MULTIVITAMIN) tablet Take 1 tablet by mouth daily.    Theresa Bird Kitchen oxyCODONE-acetaminophen (PERCOCET/ROXICET) 5-325 MG tablet Take 1-2 tablets by mouth every 4 (four) hours as needed for moderate pain or severe pain. 30 tablet 0  . OXYGEN Inhale 2 L into the lungs every evening. And night for COPD    . sertraline (ZOLOFT) 50 MG tablet Take 50 mg by mouth daily.    . vitamin C (ASCORBIC ACID) 500 MG tablet Take 500 mg by mouth 2 (two) times daily.     No current facility-administered medications for this visit.   Allergies:  Review of patient's allergies indicates no known allergies.   Social History: The patient  reports that she has never smoked. She has never used smokeless tobacco. She reports that she drinks  alcohol. She reports that she does not use illicit drugs.   ROS:  Please see the history of present illness. Otherwise, complete review of systems is positive for limited ambulation with hip pain.  All other systems are reviewed and negative.   Physical Exam: VS:  BP 105/71 mmHg  Pulse 119  Ht 5\' 9"  (1.753 m)  Wt 144 lb (65.318 kg)  BMI 21.26 kg/m2  SpO2 99%, BMI Body mass index is 21.26 kg/(m^2).  Wt Readings from Last 3 Encounters:  12/18/15 144 lb (65.318 kg)  12/10/15 145 lb 4.5 oz (65.9 kg)  11/16/15 141 lb 8 oz (64.184 kg)    General: Chronically ill-appearing elderly woman in wheelchair. HEENT: Conjunctiva and lids normal, oropharynx clear. Neck: Supple, no elevated JVP or carotid bruits, no thyromegaly. Lungs: Decreased breath sounds, nonlabored breathing at  rest. Cardiac: Irregularly irregular, rapid, no S3 or significant systolic murmur. Abdomen: Soft, nontender, bowel sounds present, no guarding or rebound. Extremities: No pitting edema, distal pulses 2+. Skin: Warm and dry. Musculoskeletal: Mild kyphosis. Neuropsychiatric: Alert and oriented x3, affect grossly appropriate.  ECG: I personally reviewed the tracing from 12/09/2015 showed possible atrial flutter with 2:1 block and nonspecific T-wave changes.  Recent Labwork: 10/13/2015: ALT 15; AST 24 12/09/2015: TSH 1.490 12/10/2015: BUN 11; Creatinine, Ser 0.48; Hemoglobin 11.4*; Platelets 556*; Potassium 3.3*; Sodium 135     Component Value Date/Time   CHOL 145 09/09/2014 1350   TRIG 79 09/09/2014 1350   HDL 29* 09/09/2014 1350   CHOLHDL 5.0 09/09/2014 1350   VLDL 16 09/09/2014 1350   LDLCALC 100* 09/09/2014 1350    Other Studies Reviewed Today:  Chest x-ray 12/09/2015: FINDINGS: Peripheral indistinct airspace opacity at the right base. There is no edema, effusion, or pneumothorax. No cardiomegaly. Status post CABG. Left basilar density attributed mitral annular calcification.  IMPRESSION: Small nonspecific  opacity at the right base. In the setting of trauma favor aspiration or atelectasis. No neighboring rib fracture. Correlate for infectious symptoms.  Echocardiogram 06/08/2011 [facility]): LVEF greater than 65% with grade 2 diastolic dysfunction, moderate left atrial enlargement, moderately sclerotic aortic valve, mitral annular calcification.  Assessment and Plan:  1. Chronic atrial fibrillation/flutter. Heart rate is not adequately controlled at this time. Recent records reviewed including medication adjustments. Would continue Cardizem CD and increase Toprol-XL to 25 mg twice daily. She will remain on the Eliquis, stop aspirin at this time.  2. Essential hypertension, blood pressure well controlled.  3. CAD status post CABG in 2002. No active angina symptoms. Continue observation.  4. Hyperlipidemia, continues on Lipitor. Last LDL 100.  Current medicines were reviewed with the patient today.   Orders Placed This Encounter  Procedures  . EKG 12-Lead    Disposition: Follow-up with me in 3 months.  Signed, Satira Sark, MD, [facility] 12/18/2015 11:38 AM    [facility] [place], [place], [place] [address] Phone: [phone]; Fax: [phone]

## 2015-12-18 NOTE — Patient Instructions (Signed)
Medication Instructions:  Your physician has recommended you make the following change in your medication: Stop aspirin. Increase toprol xl 25 mg to twice daily. Continue all other medications the same.  Labwork: NONE  Testing/Procedures: NONE  Follow-Up: Your physician recommends that you schedule a follow-up appointment in: 3 months.  Any Other Special Instructions Will Be Listed Below (If Applicable).  If you need a refill on your cardiac medications before your next appointment, please call your pharmacy.

## 2016-01-12 ENCOUNTER — Telehealth: Payer: Self-pay | Admitting: Cardiology

## 2016-01-12 NOTE — Telephone Encounter (Signed)
Malori from [facility]  called stating that Theresa Bird HR today ranged from 68 to 140 during her OT .Please call [phone]

## 2016-01-12 NOTE — Telephone Encounter (Signed)
Would start by increasing morning dose of Toprol-XL to 50 mg. Would have her obtain an ECG approximately 3-5 days afterwards.

## 2016-01-12 NOTE — Telephone Encounter (Addendum)
Per OT therapist, patient's HR is ranging from 68-140's. No c/o dizziness, chest pain, or sob. OT therapy asking if they should continue with services since HR isn't controlled. Services were ordered by  Dr. Wenda Overland per [facility].  Theresa Bird advised that per last office note, MD is aware that HR isn't controlled well and did make changes at that time to her betablocker. Per [facility], patient's med profile shows that patient is taking toprol xl 25 mg daily. Nurse contacted patient to verify dose and directions.

## 2016-01-12 NOTE — Telephone Encounter (Signed)
Contacted patient and spoke with daughter Mariann Laster. Per Mariann Laster, patient is taking toprol xl 25 mg twice daily along with diltiazem 240 mg daily.

## 2016-01-13 NOTE — Telephone Encounter (Signed)
Agree with change in Toprol XL to 50 mg AM and 25 mg PM. May need to go to 50 mg BID depending on HR and BP. Have her get nurse visit in about a week to reassess vitals, may not need ECG.

## 2016-01-15 NOTE — Telephone Encounter (Signed)
Patient informed of Dr. Myles Gip recommendation and scheduled for nurse visit next week.

## 2016-01-16 ENCOUNTER — Other Ambulatory Visit: Payer: Self-pay | Admitting: Internal Medicine

## 2016-01-25 ENCOUNTER — Telehealth: Payer: Self-pay | Admitting: Family

## 2016-01-25 NOTE — Telephone Encounter (Signed)
Daughter Mariann Laster called saying her mother passed away last  night February 02, 2016 at 8/15 pm.

## 2016-02-05 DEATH — deceased

## 2016-02-22 ENCOUNTER — Ambulatory Visit: Payer: Medicare Other | Admitting: Family

## 2016-02-22 ENCOUNTER — Other Ambulatory Visit (HOSPITAL_COMMUNITY): Payer: Medicare Other

## 2016-03-23 ENCOUNTER — Ambulatory Visit: Payer: Medicare Other | Admitting: Cardiology

## 2016-10-06 IMAGING — CR DG CHEST 1V PORT
1 series · 1 of 1 positions shown · non-contrast
Comparison: 07/23/2015

CLINICAL DATA: Left hip fracture. Pre-op respiratory exam diabetes,
atrial fibrillation, and coronary artery disease.

EXAM:
PORTABLE CHEST 1 VIEW

[AP]
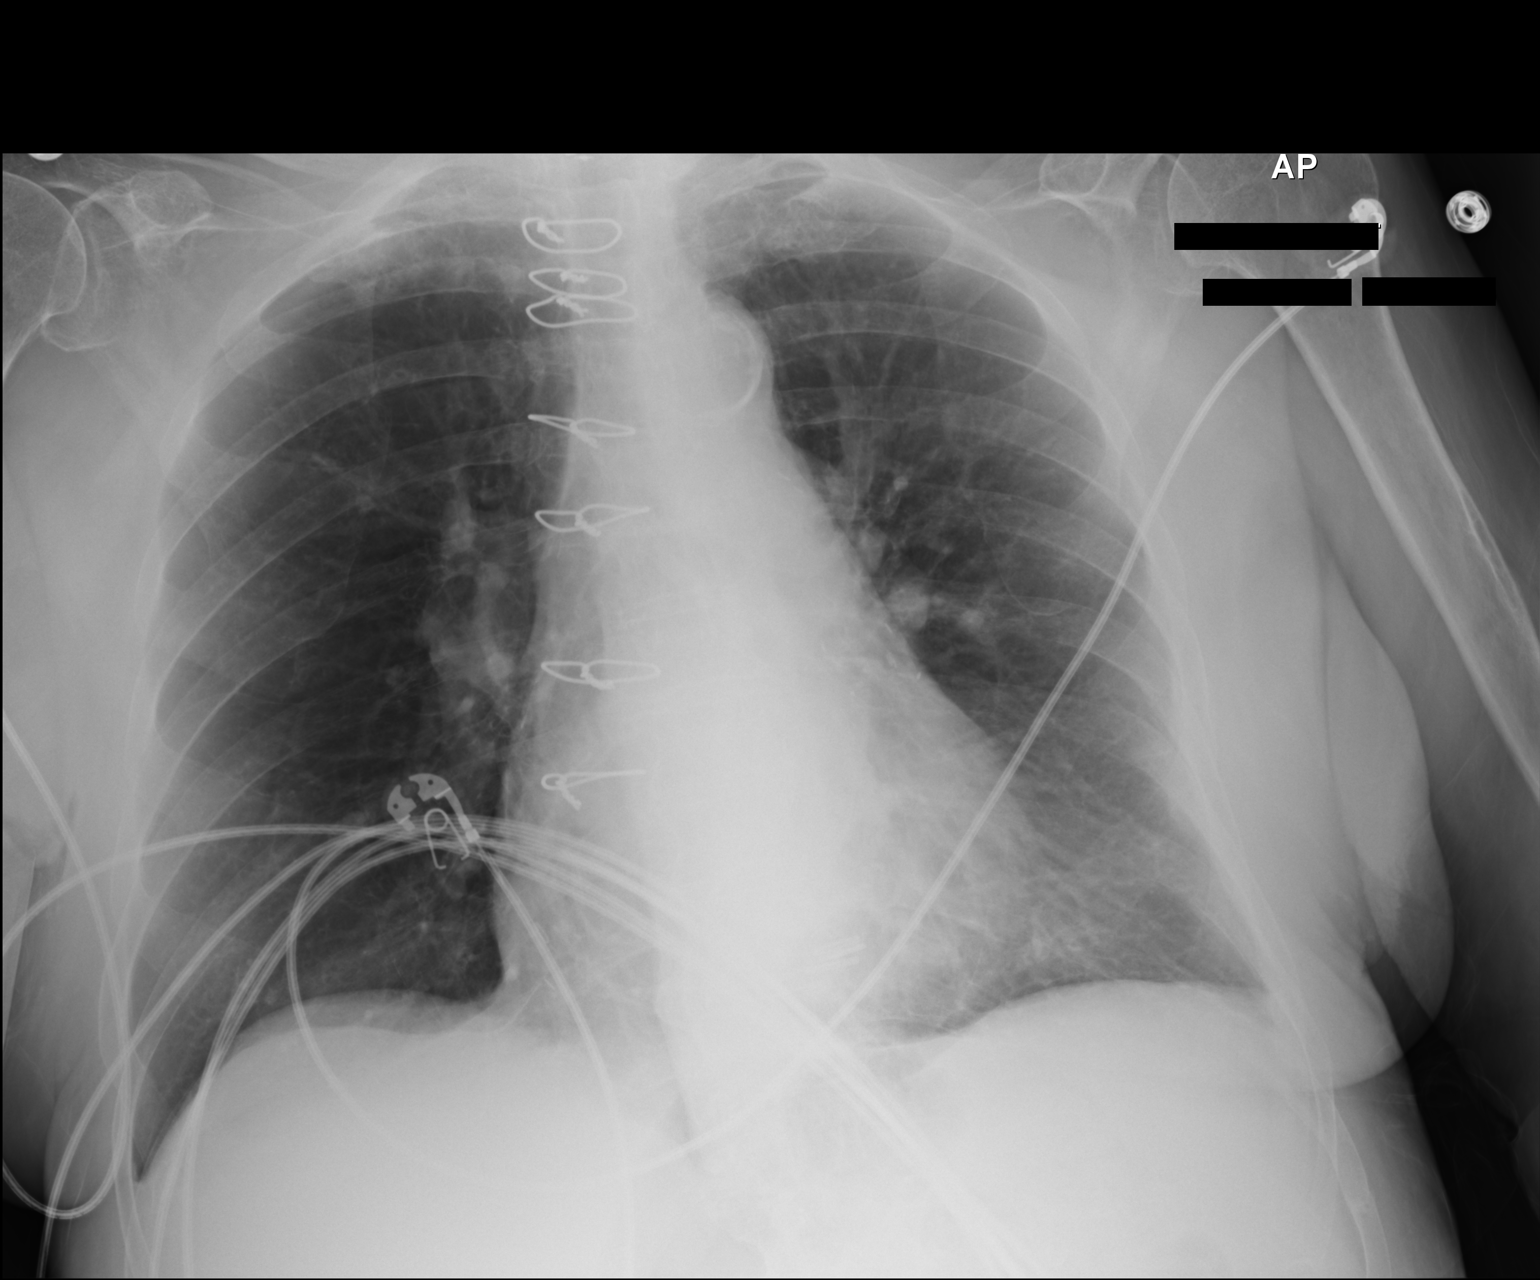

[1 of 1 positions shown; findings below may reference images not displayed]

FINDINGS: Heart size is within normal limits. Both lungs are clear. No
evidence of pneumothorax or pleural effusion. Lordotic positioning
noted. Previous median sternotomy noted.
IMPRESSION: No active disease.

## 2016-10-07 IMAGING — RF DG HIP (WITH PELVIS) OPERATIVE*L*
1 series · 2 of 2 positions shown · non-contrast
Comparison: None.

CLINICAL DATA: Left hip fracture.

EXAM:
OPERATIVE LEFT HIP (WITH PELVIS IF PERFORMED) 2 VIEWS
TECHNIQUE: Fluoroscopic spot image(s) were submitted for interpretation
post-operatively.

[Series 1: run · 2 of 2 slices shown]
[im 1/2]
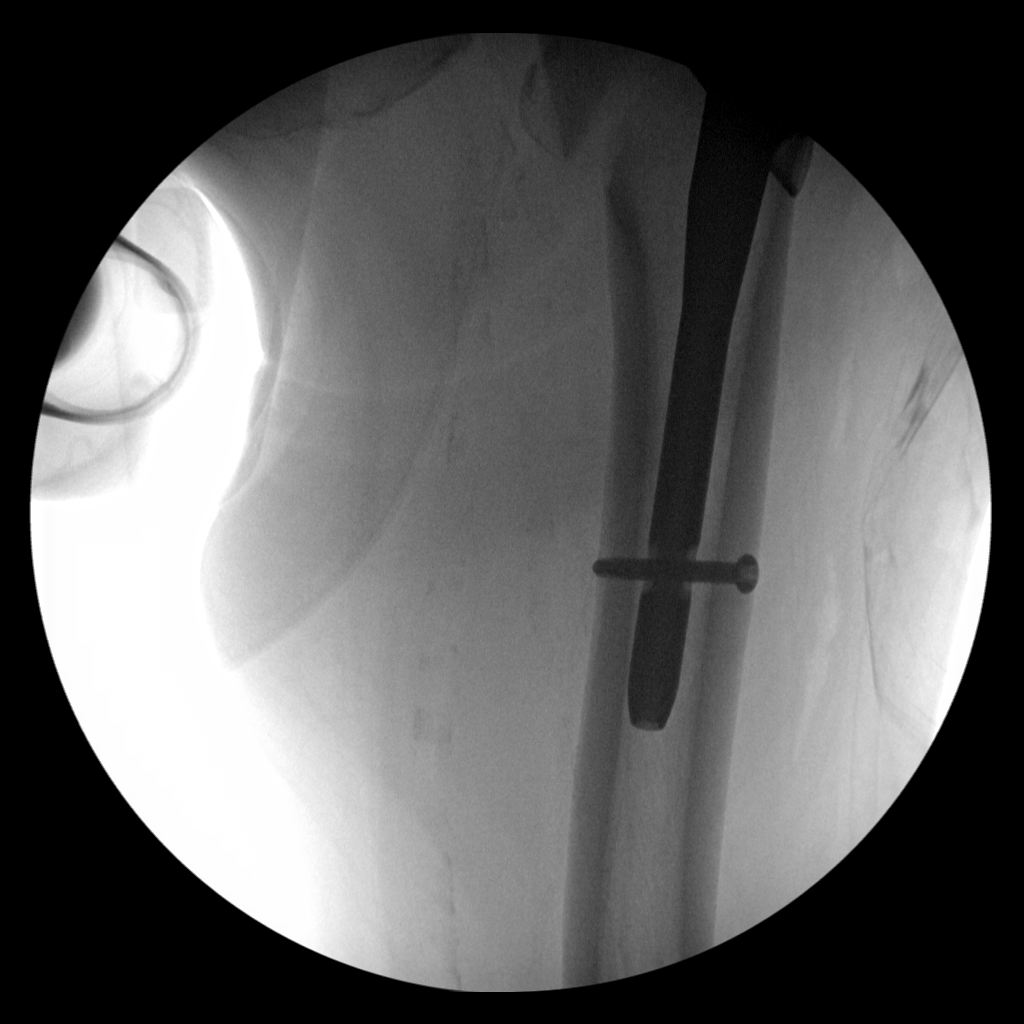
[im 2/2]
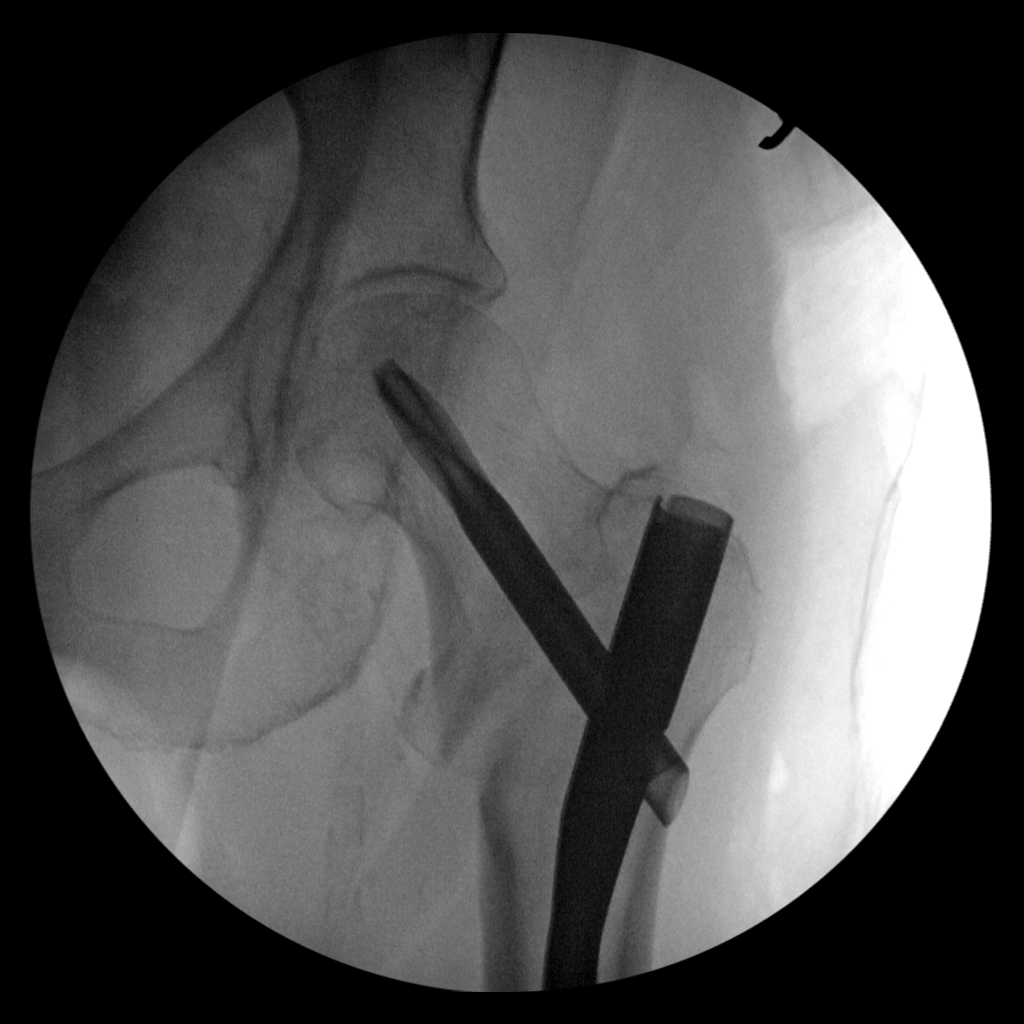

[2 of 2 positions shown; findings below may reference images not displayed]

FINDINGS: Nail and intramedullary rod fixation of intertrochanteric hip
fracture is seen which is in near anatomic alignment.
IMPRESSION: Internal fixation of intertrochanteric left hip fracture in near
anatomic alignment.

## 2016-10-09 IMAGING — CR DG CHEST 1V PORT
1 series · 1 of 1 positions shown · non-contrast
Comparison: 09/04/2015

CLINICAL DATA: Dyspnea, hx: left hip surgery yesterday

EXAM:
PORTABLE CHEST - 1 VIEW

[AP]
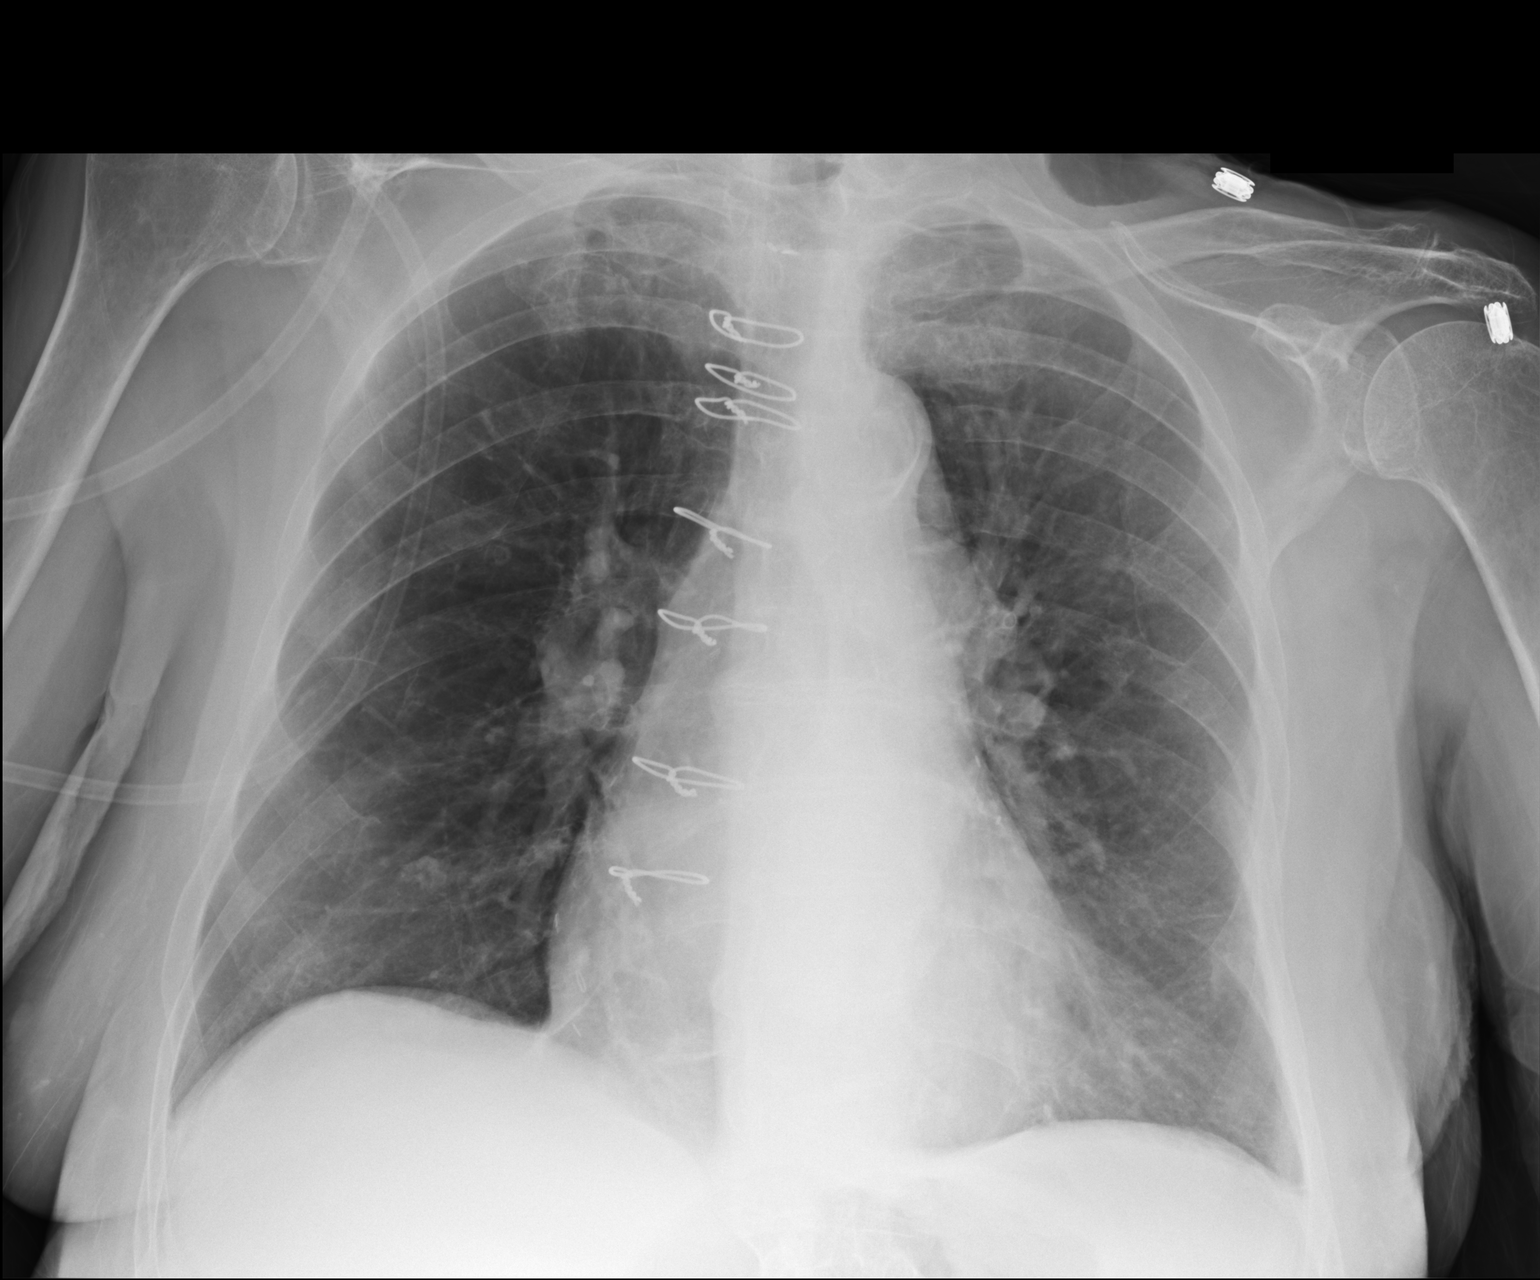

[1 of 1 positions shown; findings below may reference images not displayed]

FINDINGS: Attenuated bronchovascular markings peripherally in the right lung
as before. No focal infiltrate or overt edema. Heart size normal. No
pneumothorax.
No effusion. Atheromatous aorta. Left carotid calcifications.
Previous median sternotomy.
IMPRESSION: No acute disease post median sternotomy.

## 2017-01-13 IMAGING — CT CT HIP*L* W/O CM
1 series · 16 of 32 positions shown, 20 images · non-contrast
Comparison: Radiographs 12/09/2015

CLINICAL DATA: Fell 12/09/2015. Left hip pain. Recent prior hip
fracture and fixation in September 2015.

EXAM:
CT OF THE LEFT HIP WITHOUT CONTRAST
TECHNIQUE: Multidetector CT imaging of the left hip was performed according to
the standard protocol. Multiplanar CT image reconstructions were
also generated.

[Series 3: hip soft tissue · axial · 0.41mm/px · z∈[-733,-513]mm · 16 of 122 slices shown, 20 images]
[im 8/122  soft-tissue]
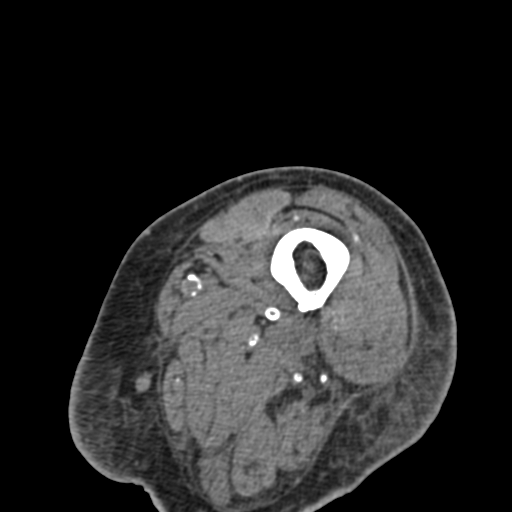
[im 8/122  bone]
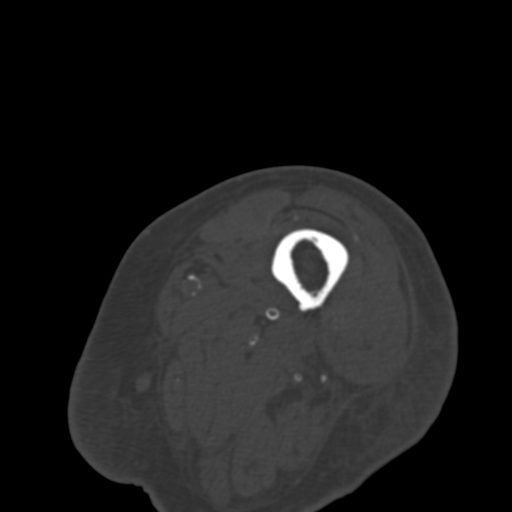
[im 16/122  soft-tissue]
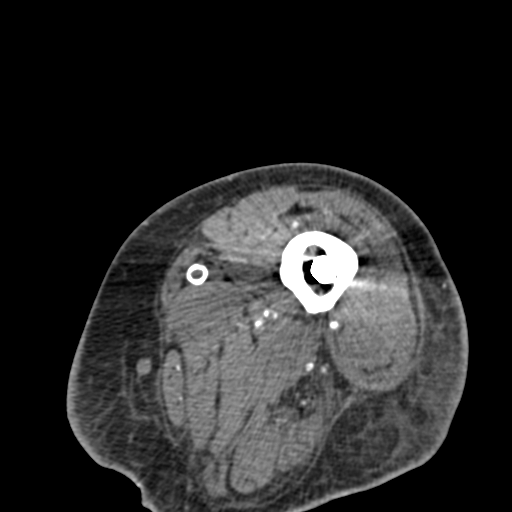
[im 24/122  soft-tissue]
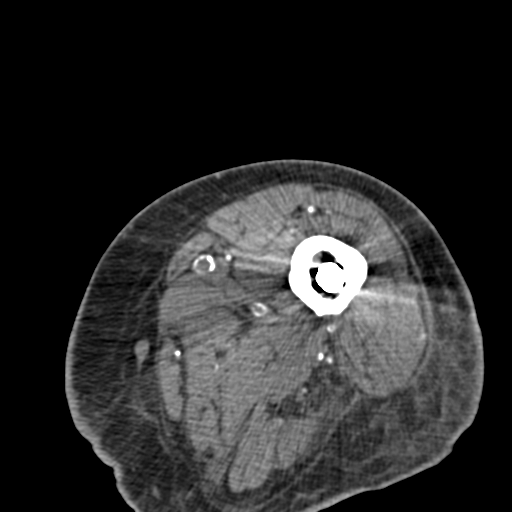
[im 32/122  soft-tissue]
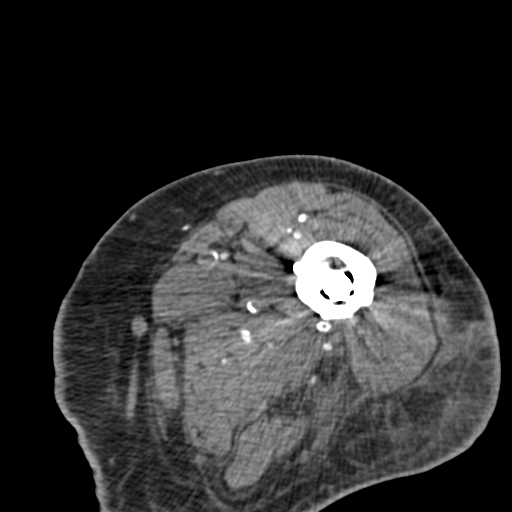
[im 40/122  soft-tissue]
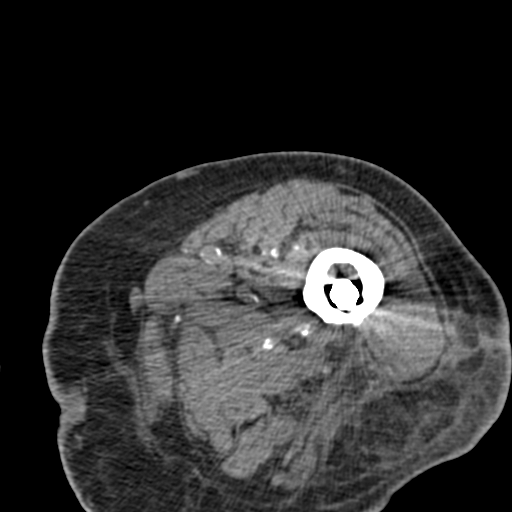
[im 47/122  soft-tissue]
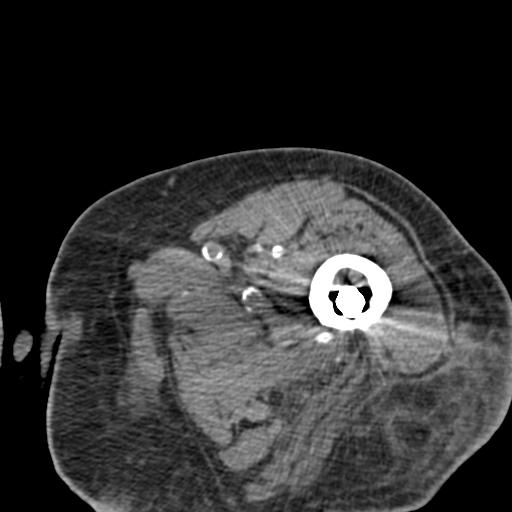
[im 55/122  soft-tissue]
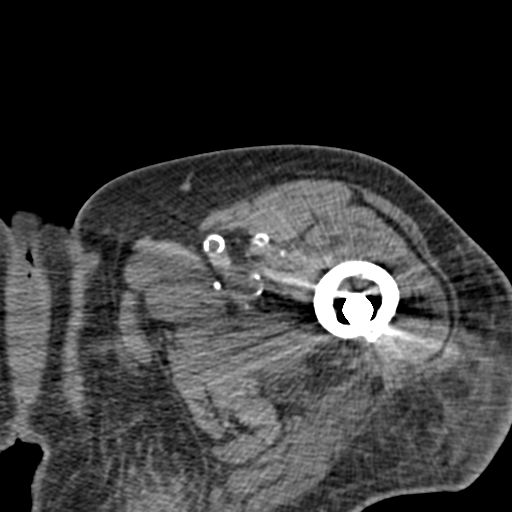
[im 67/122  soft-tissue]
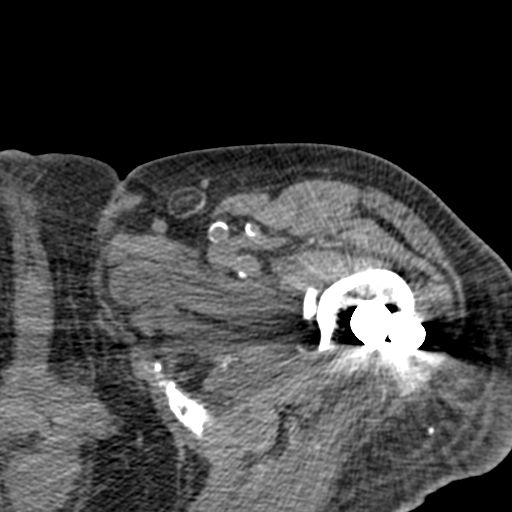
[im 75/122  soft-tissue]
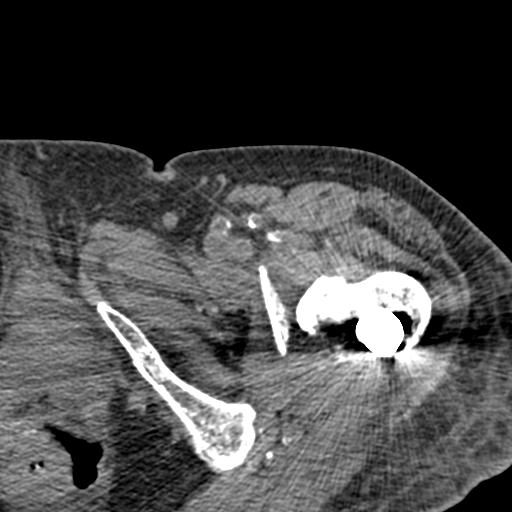
[im 75/122  bone]
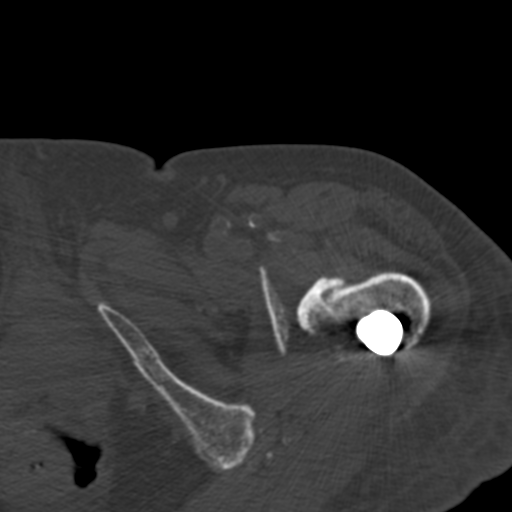
[im 82/122  soft-tissue]
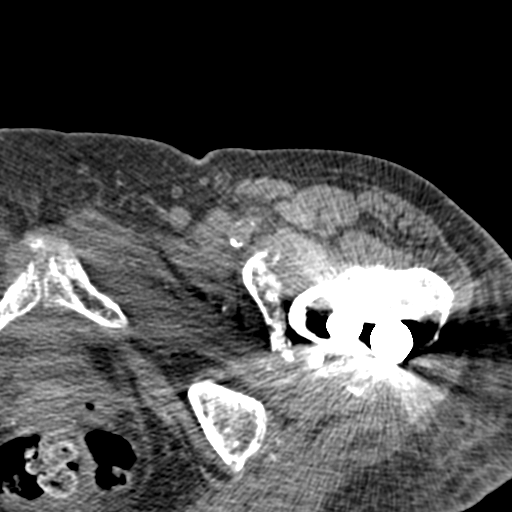
[im 90/122  soft-tissue]
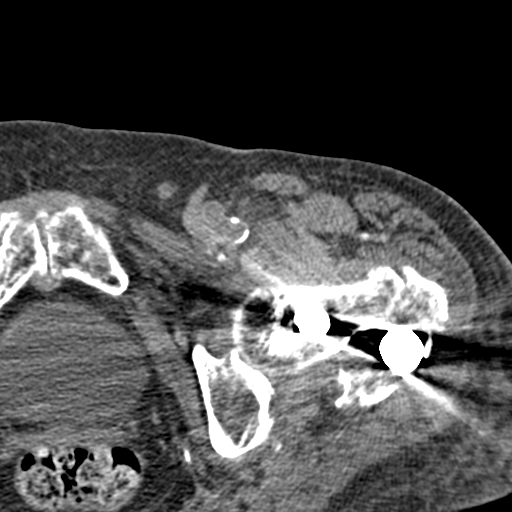
[im 98/122  soft-tissue]
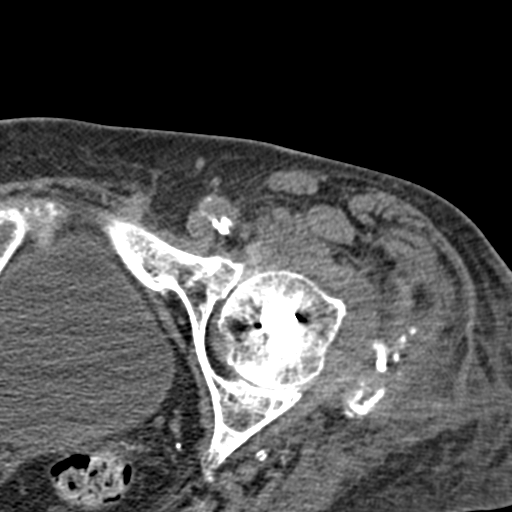
[im 106/122  soft-tissue]
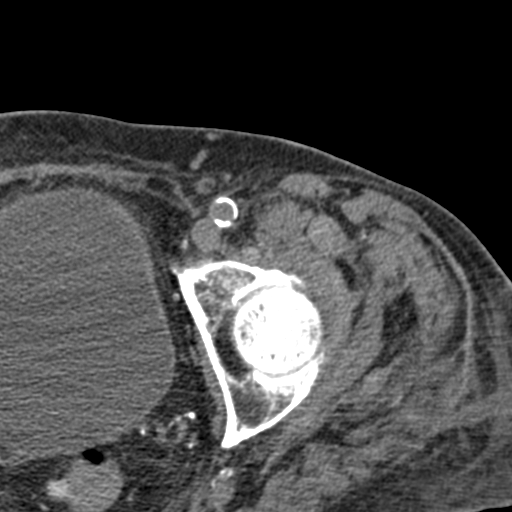
[im 106/122  lung]
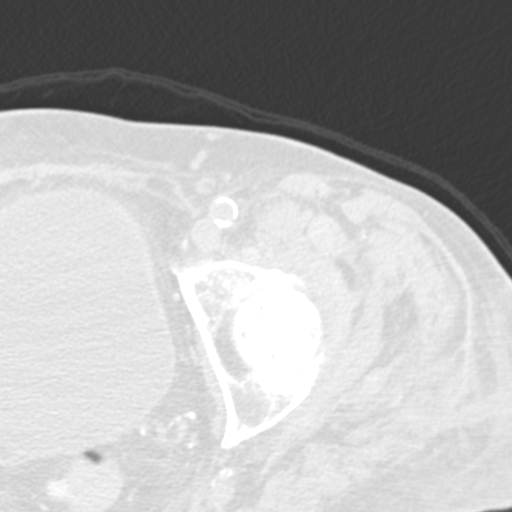
[im 110/122  lung]
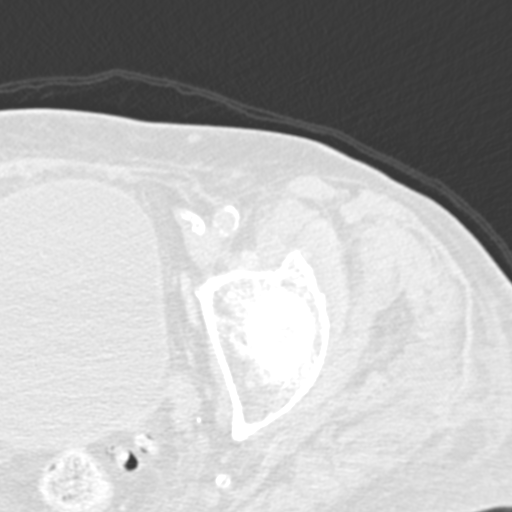
[im 114/122  soft-tissue]
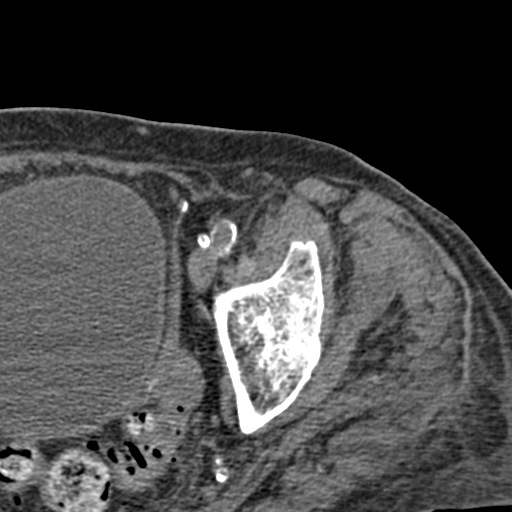
[im 114/122  lung]
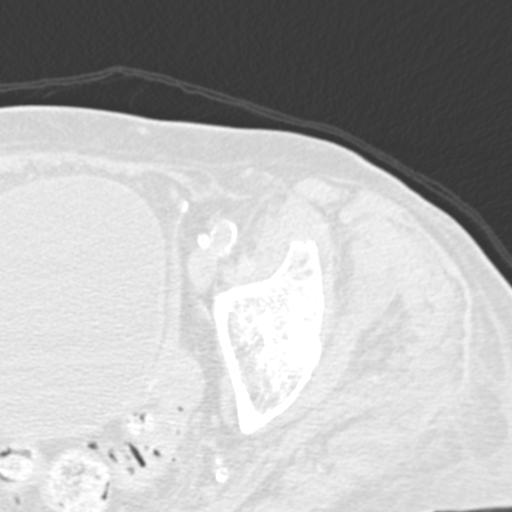
[im 118/122  lung]
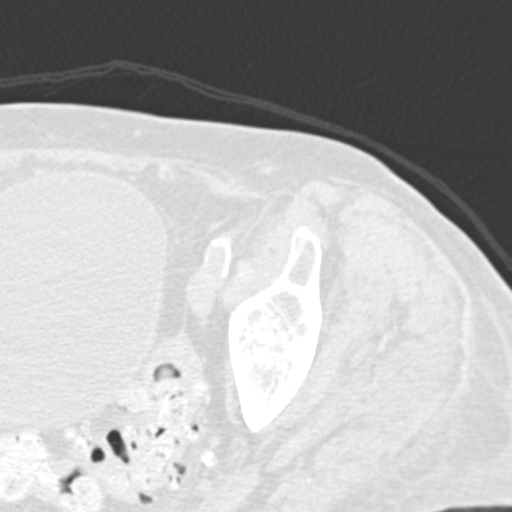

[16 of 32 positions shown; findings below may reference images not displayed]

FINDINGS: There is a gamma nail and dynamic hip screw transfixing the remote
intertrochanteric fracture. No complicating features associate with
the hardware. No new fractures identified. Old avulsion fracture of
the lesser trochanter.

There are acute nondisplaced superior and inferior pubic rami
fractures on the left side likely accounting for the patient's left
hip pain. There is also subcutaneous edema/fluid which could be
acute contusion.
IMPRESSION: Intact fracture fixation hardware in the left hip. No new/acute hip
fracture.

Nondisplaced acute superior and inferior pubic rami fractures on the
left side.
